# Patient Record
Sex: Male | Born: 1965
Health system: Southern US, Community
[De-identification: ages and names within clinical notes are randomized; demographics above are authoritative.]

## PROBLEM LIST (undated history)

## (undated) DIAGNOSIS — E785 Hyperlipidemia, unspecified: Secondary | ICD-10-CM

## (undated) DIAGNOSIS — I639 Cerebral infarction, unspecified: Secondary | ICD-10-CM

## (undated) DIAGNOSIS — I10 Essential (primary) hypertension: Secondary | ICD-10-CM

## (undated) DIAGNOSIS — R4701 Aphasia: Secondary | ICD-10-CM

## (undated) HISTORY — DX: Aphasia: R47.01

## (undated) HISTORY — DX: Hyperlipidemia, unspecified: E78.5

## (undated) HISTORY — PX: NO PAST SURGERIES: SHX2092

---

## 2015-07-27 DIAGNOSIS — I639 Cerebral infarction, unspecified: Secondary | ICD-10-CM

## 2015-07-27 HISTORY — DX: Cerebral infarction, unspecified: I63.9

## 2015-11-10 DIAGNOSIS — I610 Nontraumatic intracerebral hemorrhage in hemisphere, subcortical: Secondary | ICD-10-CM | POA: Insufficient documentation

## 2015-11-10 DIAGNOSIS — Z8679 Personal history of other diseases of the circulatory system: Secondary | ICD-10-CM | POA: Insufficient documentation

## 2015-11-10 DIAGNOSIS — I619 Nontraumatic intracerebral hemorrhage, unspecified: Secondary | ICD-10-CM | POA: Insufficient documentation

## 2015-11-10 DIAGNOSIS — Z720 Tobacco use: Secondary | ICD-10-CM | POA: Insufficient documentation

## 2015-11-10 DIAGNOSIS — I10 Essential (primary) hypertension: Secondary | ICD-10-CM | POA: Insufficient documentation

## 2015-11-10 HISTORY — DX: Nontraumatic intracerebral hemorrhage in hemisphere, subcortical: I61.0

## 2016-04-28 DIAGNOSIS — I10 Essential (primary) hypertension: Secondary | ICD-10-CM | POA: Diagnosis not present

## 2016-04-28 DIAGNOSIS — E785 Hyperlipidemia, unspecified: Secondary | ICD-10-CM | POA: Diagnosis not present

## 2016-04-28 DIAGNOSIS — Z720 Tobacco use: Secondary | ICD-10-CM | POA: Diagnosis not present

## 2016-04-28 DIAGNOSIS — Z125 Encounter for screening for malignant neoplasm of prostate: Secondary | ICD-10-CM | POA: Diagnosis not present

## 2016-04-28 DIAGNOSIS — Z1159 Encounter for screening for other viral diseases: Secondary | ICD-10-CM | POA: Diagnosis not present

## 2016-04-28 DIAGNOSIS — J441 Chronic obstructive pulmonary disease with (acute) exacerbation: Secondary | ICD-10-CM | POA: Diagnosis not present

## 2016-10-27 DIAGNOSIS — Z1211 Encounter for screening for malignant neoplasm of colon: Secondary | ICD-10-CM | POA: Diagnosis not present

## 2016-10-27 DIAGNOSIS — E785 Hyperlipidemia, unspecified: Secondary | ICD-10-CM | POA: Diagnosis not present

## 2016-10-27 DIAGNOSIS — I10 Essential (primary) hypertension: Secondary | ICD-10-CM | POA: Diagnosis not present

## 2017-01-24 DIAGNOSIS — J0110 Acute frontal sinusitis, unspecified: Secondary | ICD-10-CM | POA: Diagnosis not present

## 2017-01-24 DIAGNOSIS — I638 Other cerebral infarction: Secondary | ICD-10-CM | POA: Diagnosis not present

## 2017-01-24 DIAGNOSIS — I10 Essential (primary) hypertension: Secondary | ICD-10-CM | POA: Diagnosis not present

## 2017-02-22 DIAGNOSIS — J208 Acute bronchitis due to other specified organisms: Secondary | ICD-10-CM | POA: Diagnosis not present

## 2017-02-22 DIAGNOSIS — Z720 Tobacco use: Secondary | ICD-10-CM | POA: Diagnosis not present

## 2017-02-22 DIAGNOSIS — R05 Cough: Secondary | ICD-10-CM | POA: Diagnosis not present

## 2017-03-27 ENCOUNTER — Emergency Department
Admission: EM | Admit: 2017-03-27 | Discharge: 2017-03-27 | Disposition: A | Discharge status: Home / Self Care | Payer: BC Managed Care – PPO | Attending: Emergency Medicine | Admitting: Emergency Medicine

## 2017-03-27 ENCOUNTER — Emergency Department: Payer: BC Managed Care – PPO

## 2017-03-27 ENCOUNTER — Encounter: Payer: Self-pay | Admitting: Emergency Medicine

## 2017-03-27 DIAGNOSIS — Z79899 Other long term (current) drug therapy: Secondary | ICD-10-CM | POA: Insufficient documentation

## 2017-03-27 DIAGNOSIS — Z8673 Personal history of transient ischemic attack (TIA), and cerebral infarction without residual deficits: Secondary | ICD-10-CM | POA: Insufficient documentation

## 2017-03-27 DIAGNOSIS — K047 Periapical abscess without sinus: Secondary | ICD-10-CM | POA: Insufficient documentation

## 2017-03-27 DIAGNOSIS — F17200 Nicotine dependence, unspecified, uncomplicated: Secondary | ICD-10-CM | POA: Diagnosis not present

## 2017-03-27 DIAGNOSIS — I10 Essential (primary) hypertension: Secondary | ICD-10-CM | POA: Diagnosis not present

## 2017-03-27 DIAGNOSIS — R200 Anesthesia of skin: Secondary | ICD-10-CM | POA: Diagnosis not present

## 2017-03-27 HISTORY — DX: Essential (primary) hypertension: I10

## 2017-03-27 HISTORY — DX: Cerebral infarction, unspecified: I63.9

## 2017-03-27 LAB — DIFFERENTIAL
Basophils Absolute: 0.1 K/uL (ref 0–0.1)
Basophils Relative: 1 %
Eosinophils Absolute: 0.3 K/uL (ref 0–0.7)
Eosinophils Relative: 3 %
Lymphocytes Relative: 22 %
Lymphs Abs: 2.5 K/uL (ref 1.0–3.6)
Monocytes Absolute: 1 K/uL (ref 0.2–1.0)
Monocytes Relative: 9 %
Neutro Abs: 7.4 K/uL — ABNORMAL HIGH (ref 1.4–6.5)
Neutrophils Relative %: 65 %

## 2017-03-27 LAB — COMPREHENSIVE METABOLIC PANEL WITH GFR
ALT: 23 U/L (ref 17–63)
AST: 27 U/L (ref 15–41)
Albumin: 3.7 g/dL (ref 3.5–5.0)
Alkaline Phosphatase: 80 U/L (ref 38–126)
Anion gap: 6 (ref 5–15)
BUN: 15 mg/dL (ref 6–20)
CO2: 24 mmol/L (ref 22–32)
Calcium: 8.8 mg/dL — ABNORMAL LOW (ref 8.9–10.3)
Chloride: 108 mmol/L (ref 101–111)
Creatinine, Ser: 1.03 mg/dL (ref 0.61–1.24)
GFR calc Af Amer: >60 mL/min (ref >60)
GFR calc non Af Amer: >60 mL/min (ref >60)
Glucose, Bld: 129 mg/dL — ABNORMAL HIGH (ref 65–99)
Potassium: 3.8 mmol/L (ref 3.5–5.1)
Sodium: 138 mmol/L (ref 135–145)
Total Bilirubin: 1.5 mg/dL — ABNORMAL HIGH (ref 0.3–1.2)
Total Protein: 7.2 g/dL (ref 6.5–8.1)

## 2017-03-27 LAB — CBC
HCT: 46.8 % (ref 40.0–52.0)
Hemoglobin: 16.7 g/dL (ref 13.0–18.0)
MCH: 31.5 pg (ref 26.0–34.0)
MCHC: 35.8 g/dL (ref 32.0–36.0)
MCV: 88.2 fL (ref 80.0–100.0)
Platelets: 253 K/uL (ref 150–440)
RBC: 5.31 MIL/uL (ref 4.40–5.90)
RDW: 13.2 % (ref 11.5–14.5)
WBC: 11.3 K/uL — ABNORMAL HIGH (ref 3.8–10.6)

## 2017-03-27 LAB — APTT: aPTT: 29 s (ref 24–36)

## 2017-03-27 LAB — TROPONIN I: Troponin I: <0.03 ng/mL (ref <0.03)

## 2017-03-27 LAB — PROTIME-INR
INR: 0.99
PROTHROMBIN TIME: 13 s (ref 11.4–15.2)

## 2017-03-27 MED ORDER — CLINDAMYCIN HCL 300 MG PO CAPS: 300.0000 mg | ORAL_CAPSULE | Freq: Three times a day (TID) | ORAL | 0 refills | Status: AC

## 2017-03-27 MED ORDER — HYDROMORPHONE HCL 1 MG/ML IJ SOLN
0.5000 mg | Freq: Once | INTRAMUSCULAR | Status: AC
  Administered 2017-03-27: 0.5 mg via INTRAVENOUS
  Filled 2017-03-27: qty 1

## 2017-03-27 MED ORDER — CLINDAMYCIN PHOSPHATE 600 MG/50ML IV SOLN
600.0000 mg | Freq: Once | INTRAVENOUS | Status: AC
  Administered 2017-03-27: 600 mg via INTRAVENOUS
  Filled 2017-03-27: qty 50

## 2017-03-27 MED ORDER — OXYCODONE-ACETAMINOPHEN 7.5-325 MG PO TABS: 1.0000 | ORAL_TABLET | ORAL | 0 refills | Status: DC | PRN

## 2017-03-27 MED ORDER — OXYCODONE-ACETAMINOPHEN 5-325 MG PO TABS
1.0000 | ORAL_TABLET | Freq: Once | ORAL | Status: AC
  Administered 2017-03-27: 1 via ORAL
  Filled 2017-03-27: qty 1

## 2017-03-27 NOTE — ED Notes (Signed)
FIRST NURSE NOTE:  Right facial numbness since Friday, tingling down backside since last night.  Pt also states he had a toothache.

## 2017-03-27 NOTE — ED Provider Notes (Signed)
[facility] Emergency Department Provider Note  ____________________________________________   First MD Initiated Contact with Patient 03/27/17 1315     (approximate)  I have reviewed the triage vital signs and the nursing notes.   HISTORY  Chief Complaint No chief complaint on file.  Chief complaint is numbness of the face  HPI Jon Yang is a 51 y.o. male with a past history of a stroke who  Numbness on the right side of the jaw He's had a toothache for about a month and numbness for the last couple days. It is spreading it was just in one spot and now spreading to the point of the chin. He is feeling his heart beat in the area of the toothache   Past Medical History:  Diagnosis Date  . Hypertension   . Stroke [facility])     There are no active problems to display for this patient.   History reviewed. No pertinent surgical history.  Prior to Admission medications   Medication Sig Start Date End Date Taking? Authorizing Provider  amLODipine (NORVASC) 5 MG tablet Take 5 mg by mouth daily. 02/22/17  Yes [provider]  cholecalciferol (VITAMIN D) 1000 units tablet Take 5,000 Units by mouth daily.   Yes [provider]  fluticasone (FLONASE) 50 MCG/ACT nasal spray Place 1 spray into both nostrils 2 (two) times daily. 01/24/17  Yes [provider]  ipratropium-albuterol (DUONEB) 0.5-2.5 (3) MG/3ML SOLN Inhale 3 mLs into the lungs 4 (four) times daily as needed for shortness of breath or wheezing. 02/22/17  Yes [provider]  lisinopril (PRINIVIL,ZESTRIL) 30 MG tablet Take 30 mg by mouth daily. 03/16/17  Yes [provider]  PROAIR HFA 108 (90 Base) MCG/ACT inhaler Inhale 2 puffs into the lungs 4 (four) times daily as needed for wheezing or shortness of breath. 02/22/17  Yes [provider]  SUPER B COMPLEX/C PO Take 1 tablet by mouth 2 (two) times daily.   Yes [provider]  clindamycin  (CLEOCIN) 300 MG capsule Take 1 capsule (300 mg total) by mouth 3 (three) times daily. 03/27/17 04/06/17  Arnaldo Natal, MD  oxyCODONE-acetaminophen (PERCOCET) 7.5-325 MG tablet Take 1 tablet by mouth every 4 (four) hours as needed for severe pain. 03/27/17 03/27/18  Arnaldo Natal, MD    Allergies Patient has no known allergies.  History reviewed. No pertinent family history.  Social History Social History  Substance Use Topics  . Smoking status: Current Every Day Smoker  . Smokeless tobacco: Never Used  . Alcohol use No    Review of Systems  Constitutional: No fever/chills Eyes: No visual changes. ENT: No sore throat. Cardiovascular: Denies chest pain. Respiratory: Denies shortness of breath. Gastrointestinal: No abdominal pain.  No nausea, no vomiting.  No diarrhea.  No constipation. Genitourinary: Negative for dysuria. Musculoskeletal: Negative for back pain. Skin: Negative for rash. Neurological: Negative for headaches, focal weakness or numbnessexcept as noted in history of present illness.   ____________________________________________   PHYSICAL EXAM:  VITAL SIGNS: ED Triage Vitals [03/27/17 1137]  Enc Vitals Group     BP (!) 150/93     Pulse Rate 78     Resp 18     Temp 98.2 F (36.8 C)     Temp Source Oral     SpO2 97 %     Weight 213 lb (96.6 kg)     Height 5\' 10"  (1.778 m)     Head Circumference  Peak Flow      Pain Score 4     Pain Loc      Pain Edu?      Excl. in GC?     Constitutional: Alert and oriented. Well appearing and in no acute distress. Eyes: Conjunctivae are normal. PERRL. EOMI. Head: Atraumatic. Nose: No congestion/rhinnorhea. Mouth/Throat: Mucous membranes are moist.  Oropharynx non-erythematous poor dentition multiple cavities 2 most posterior teeth on right side of jaw are tender to percussion.This is the area of the numbness. Neck: No stridor. Cardiovascular: Normal rate, regular rhythm. Grossly normal heart sounds.  Good  peripheral circulation. Respiratory: Normal respiratory effort.  No retractions. Lungs CTAB. Gastrointestinal: Soft and nontender. No distention. No abdominal bruits. No CVA tenderness. }Musculoskeletal: No lower extremity tenderness nor edema.  No joint effusions. Neurologic:  Normal speech and language. No gross focal neurologic deficits are appreciated cranial nerves II through XII are intact except for numbness in the third division of 7 cerebellar finger to nose is norma motor is normal sensation is otherwise normal. . Skin:  Skin is warm, dry and intact. No rash noted. Psychiatric: Mood and affect are normal. Speech and behavior are normal.  ____________________________________________   LABS (all labs ordered are listed, but only abnormal results are displayed)  Labs Reviewed  CBC - Abnormal; Notable for the following:       Result Value   WBC 11.3 (*)    All other components within normal limits  DIFFERENTIAL - Abnormal; Notable for the following:    Neutro Abs 7.4 (*)    All other components within normal limits  COMPREHENSIVE METABOLIC PANEL - Abnormal; Notable for the following:    Glucose, Bld 129 (*)    Calcium 8.8 (*)    Total Bilirubin 1.5 (*)    All other components within normal limits  PROTIME-INR  APTT  TROPONIN I  CBG MONITORING, ED   ____________________________________________  EKG  EKG read and interpreted by [facility] sinus rhythm at 60 normal axis nonspecific ST-T changes ____________________________________________  RADIOLOGY  Ct Head Wo Contrast  Result Date: 03/27/2017 CLINICAL DATA:  Subacute neurologic deficit. Facial numbness for several days. History of CVA. No reported injury. EXAM: CT HEAD WITHOUT CONTRAST TECHNIQUE: Contiguous axial images were obtained from the base of the skull through the vertex without intravenous contrast. COMPARISON:  None. FINDINGS: Brain: No evidence of parenchymal hemorrhage or extra-axial fluid collection. No mass  lesion, mass effect, or midline shift. No CT evidence of acute infarction. Cerebral volume is age appropriate. No ventriculomegaly. Vascular: No acute abnormality. Skull: No evidence of calvarial fracture. Sinuses/Orbits: The visualized paranasal sinuses are essentially clear. Other:  The mastoid air cells are unopacified. IMPRESSION: Negative head CT.  No evidence of acute intracranial abnormality. Electronically Signed   By: Delbert PhenixJason A Poff M.D.   On: 03/27/2017 12:34   Mr Brain Wo Contrast  Result Date: 03/27/2017 CLINICAL DATA:  51 year old male with numbness along the right lower face. Burning sensation in the area. Intermittent toothache for 1 month. Bilateral lower extremity numbness last night. EXAM: MRI HEAD WITHOUT CONTRAST TECHNIQUE: Multiplanar, multiecho pulse sequences of the brain and surrounding structures were obtained without intravenous contrast. COMPARISON:  Head CT without contrast 1158 hours today. FINDINGS: Brain: No restricted diffusion to suggest acute infarction. No midline shift, mass effect, evidence of mass lesion, ventriculomegaly, extra-axial collection or acute intracranial hemorrhage. Cervicomedullary junction and pituitary are within normal limits. Chronic hemorrhagic lacunar infarct of the right lentiform nuclei with hemosiderin. Elsewhere normal for  age Wallace Cullens and white matter signal is within normal limits throughout the brain. No cortical encephalomalacia or other chronic cerebral blood products identified. Brainstem and cerebellum appear normal. Vascular: Major intracranial vascular flow voids are preserved. Skull and upper cervical spine: Negative. Sinuses/Orbits: Normal orbits soft tissues. Visualized paranasal sinuses and mastoids are stable and well pneumatized. Other: Visible internal auditory structures appear normal. Visible scalp and face soft tissues appear negative. IMPRESSION: 1.  No acute intracranial abnormality. 2. Chronic hemorrhagic lacunar infarct of the right  basal ganglia. Otherwise normal for age noncontrast MRI appearance of the brain. Electronically Signed   By: Odessa Fleming M.D.   On: 03/27/2017 15:37   IMPRESSION: 1.  No acute intracranial abnormality. 2. Chronic hemorrhagic lacunar infarct of the right basal ganglia. Otherwise normal for age noncontrast MRI appearance of the brain.   Electronically Signed   By: Odessa Fleming M.D.   On: 03/27/2017 15:37 ____________________________________________   PROCEDURES  Procedure(s) performed:   Procedures  Critical Care performed:  ____________________________________________   INITIAL IMPRESSION / ASSESSMENT AND PLAN / ED COURSE  Pertinent labs & imaging results that were available during my care of the patient were reviewed by me and considered in my medical decision making (see chart for details). discussed with Dr. Chaney Malling DENTIST AT [facility]. She suggested IV Clinda by mouth and since he does not have a dentist [facility]ed [facility] patient he wants to try the antibiotics by mouth first.  This was okay with the dentist. ____________________________________________   FINAL CLINICAL IMPRESSION(S) / ED DIAGNOSES  Final diagnoses:  Dental infection      NEW MEDICATIONS STARTED DURING THIS VISIT:  New Prescriptions   CLINDAMYCIN (CLEOCIN) 300 MG CAPSULE    Take 1 capsule (300 mg total) by mouth 3 (three) times daily.   OXYCODONE-ACETAMINOPHEN (PERCOCET) 7.5-325 MG TABLET    Take 1 tablet by mouth every 4 (four) hours as needed for severe pain.     Note:  This document was prepared using Dragon voice recognition software and may include unintentional dictation errors.    Arnaldo Natal, MD 03/27/17 [phone]

## 2017-03-27 NOTE — Discharge Instructions (Signed)
Return for increased swelling, pain, fever or any trouble swallowing or breathing. Call 911 for the last 2. Take the clindamycin 3 times a day and Percocet 4 times a day as needed  for pain. Follow up with one of the dental clinics on the list I gave you.

## 2017-03-27 NOTE — ED Triage Notes (Signed)
Pt states he has numbness on the right side below lower lip and numbness to chin. Pt states burning sensation to area. Pt reports he has had a toothache x 1 month on and off. Pt c/o numbness in bilateral legs last night. Pt smile is equal but seems to be speaking more out of the left side of his mouth. Hx of stroke in the past. VSS.

## 2017-06-01 DIAGNOSIS — E785 Hyperlipidemia, unspecified: Secondary | ICD-10-CM | POA: Diagnosis not present

## 2017-06-01 DIAGNOSIS — I10 Essential (primary) hypertension: Secondary | ICD-10-CM | POA: Diagnosis not present

## 2017-06-01 DIAGNOSIS — J302 Other seasonal allergic rhinitis: Secondary | ICD-10-CM | POA: Diagnosis not present

## 2017-06-01 DIAGNOSIS — F17200 Nicotine dependence, unspecified, uncomplicated: Secondary | ICD-10-CM | POA: Diagnosis not present

## 2017-08-04 ENCOUNTER — Other Ambulatory Visit: Payer: Self-pay

## 2017-08-04 ENCOUNTER — Emergency Department
Admission: EM | Admit: 2017-08-04 | Discharge: 2017-08-04 | Disposition: A | Discharge status: Home / Self Care | Payer: Self-pay | Attending: Student in an Organized Health Care Education/Training Program | Admitting: Student in an Organized Health Care Education/Training Program

## 2017-08-04 ENCOUNTER — Emergency Department: Payer: Self-pay

## 2017-08-04 DIAGNOSIS — Z79899 Other long term (current) drug therapy: Secondary | ICD-10-CM | POA: Insufficient documentation

## 2017-08-04 DIAGNOSIS — J441 Chronic obstructive pulmonary disease with (acute) exacerbation: Secondary | ICD-10-CM | POA: Insufficient documentation

## 2017-08-04 DIAGNOSIS — J069 Acute upper respiratory infection, unspecified: Secondary | ICD-10-CM | POA: Insufficient documentation

## 2017-08-04 DIAGNOSIS — I10 Essential (primary) hypertension: Secondary | ICD-10-CM | POA: Insufficient documentation

## 2017-08-04 DIAGNOSIS — F17200 Nicotine dependence, unspecified, uncomplicated: Secondary | ICD-10-CM | POA: Insufficient documentation

## 2017-08-04 DIAGNOSIS — B9789 Other viral agents as the cause of diseases classified elsewhere: Secondary | ICD-10-CM | POA: Insufficient documentation

## 2017-08-04 MED ORDER — PREDNISONE 10 MG PO TABS: ORAL_TABLET | ORAL | 0 refills | Status: DC

## 2017-08-04 MED ORDER — IPRATROPIUM-ALBUTEROL 0.5-2.5 (3) MG/3ML IN SOLN
3.0000 mL | Freq: Once | RESPIRATORY_TRACT | Status: AC
  Administered 2017-08-04: 3 mL via RESPIRATORY_TRACT
  Filled 2017-08-04: qty 3

## 2017-08-04 NOTE — ED Notes (Signed)
Pt stated started getting sick on Sunday, worse on Monday, fever started yesterday but did not take temp to find out. bodyaches and chills is why he believes he had a fever. No flu shot

## 2017-08-04 NOTE — ED Triage Notes (Signed)
Pt c/o cough with congestion for the past 2 days. Pt is in NAD on arrival.

## 2017-08-04 NOTE — ED Provider Notes (Signed)
[facility] Department Provider Note  ____________________________________________   First MD Initiated Contact with Patient 08/04/17 1326     (approximate)  I have reviewed the triage vital signs and the nursing notes.   HISTORY  Chief Complaint URI  HPI Jon Yang is a 52 y.o. male is here with complaint of muscle aches and coughing for the last 4-5 days.  He is also had complaints of fever with increased cough and congestion for last 2 days.  Patient has a history of bronchitis and pneumonia.  Patient does smoke every day.  He states that he currently uses DuoNeb treatments at home.  His last one was earlier this morning.  Patient continues to have some squeezing and shortness of breath.  He rates his pain as 3/10.  Past Medical History:  Diagnosis Date  . Hypertension   . Stroke [facility](HCC)     There are no active problems to display for this patient.   History reviewed. No pertinent surgical history.  Prior to Admission medications   Medication Sig Start Date End Date Taking? Authorizing Provider  amLODipine (NORVASC) 5 MG tablet Take 5 mg by mouth daily. 02/22/17   [provider]  cholecalciferol (VITAMIN D) 1000 units tablet Take 5,000 Units by mouth daily.    [provider]  fluticasone (FLONASE) 50 MCG/ACT nasal spray Place 1 spray into both nostrils 2 (two) times daily. 01/24/17   [provider]  ipratropium-albuterol (DUONEB) 0.5-2.5 (3) MG/3ML SOLN Inhale 3 mLs into the lungs 4 (four) times daily as needed for shortness of breath or wheezing. 02/22/17   [provider]  lisinopril (PRINIVIL,ZESTRIL) 30 MG tablet Take 30 mg by mouth daily. 03/16/17   [provider]  predniSONE (DELTASONE) 10 MG tablet Take 3 tablets once a day for 5 days 08/04/17   Tommi RumpsSummers, Rhonda L, PA-C  PROAIR HFA 108 [phone](90 Base) MCG/ACT inhaler Inhale 2 puffs into the lungs 4 (four) times daily as needed for wheezing or  shortness of breath. 02/22/17   [provider]  SUPER B COMPLEX/C PO Take 1 tablet by mouth 2 (two) times daily.    [provider]    Allergies Patient has no known allergies.  No family history on file.  Social History Social History   Tobacco Use  . Smoking status: Current Every Day Smoker  . Smokeless tobacco: Never Used  Substance Use Topics  . Alcohol use: No  . Drug use: No    Review of Systems Constitutional: No fever/chills Eyes: No visual changes. ENT: No sore throat. Cardiovascular: Denies chest pain. Respiratory: Denies shortness of breath.  Positive cough. Gastrointestinal: No abdominal pain.  No nausea, no vomiting.   Musculoskeletal: Positive for muscle aches. Skin: Negative for rash. Neurological: Positive for headaches, negative for focal weakness or numbness. ___________________________________________   PHYSICAL EXAM:  VITAL SIGNS: ED Triage Vitals  Enc Vitals Group     BP 08/04/17 1302 (!) 163/78     Pulse Rate 08/04/17 1302 86     Resp 08/04/17 1302 18     Temp 08/04/17 1302 98.5 F (36.9 C)     Temp Source 08/04/17 1302 Oral     SpO2 08/04/17 1302 93 %     Weight 08/04/17 1249 200 lb (90.7 kg)     Height 08/04/17 1249 5\' 10"  (1.778 m)     Head Circumference --      Peak Flow --      Pain Score 08/04/17  1249 3     Pain Loc --      Pain Edu? --      Excl. in GC? --    Constitutional: Alert and oriented. Well appearing and in no acute distress. Eyes: Conjunctivae are normal.  Head: Atraumatic. Nose: Minimal congestion/rhinnorhea.  TMs are dull bilaterally. Mouth/Throat: Mucous membranes are moist.  Oropharynx non-erythematous.  Posterior drainage present. Neck: No stridor.   Hematological/Lymphatic/Immunilogical: No cervical lymphadenopathy. Cardiovascular: Normal rate, regular rhythm. Grossly normal heart sounds.  Good peripheral circulation. Respiratory: Normal respiratory effort.  No retractions. Lungs minimal  wheeze is heard on expiratory.  Patient has very congested cough.  No respiratory difficulty and patient is able to speak in complete sentences. Gastrointestinal: Soft and nontender. No distention.  Musculoskeletal: Moves upper and lower extremities without any difficulty.  Normal gait was noted. Neurologic:  Normal speech and language. No gross focal neurologic deficits are appreciated.  Skin:  Skin is warm, dry and intact. No rash noted. Psychiatric: Mood and affect are normal. Speech and behavior are normal.  ____________________________________________   LABS (all labs ordered are listed, but only abnormal results are displayed)  Labs Reviewed - No data to display  RADIOLOGY  Dg Chest 2 View  Result Date: 08/04/2017 CLINICAL DATA:  Body aches and fever for the past 4 days. Questionable fever. Current smoker. Previous episodes of bronchitis. EXAM: CHEST  2 VIEW COMPARISON:  None in PACs FINDINGS: The lungs are mildly hyperinflated with hemidiaphragm flattening. The interstitial markings are coarse. There is no alveolar infiltrate or pleural effusion. The heart and pulmonary vascularity are normal. The mediastinum is normal in width. The bony thorax exhibits no acute abnormality. IMPRESSION: COPD. Interstitial changes most compatible with the patient's smoking history. No acute pneumonia nor CHF. Electronically Signed   By: David  [place] M.D.   On: 08/04/2017 14:25    ____________________________________________   PROCEDURES  Procedure(s) performed: None  Procedures  Critical Care performed: No  ____________________________________________   INITIAL IMPRESSION / ASSESSMENT AND PLAN / ED COURSE Patient will continue using his nebulizer treatments at home and regular medication.  Patient was given a prescription for prednisone to help both with his cough and breathing.  Patient states that he has not been on steroids for quite some time for his COPD.  Patient was reassured that  there was no pneumonia noted on his chest x-ray.  He will follow-up with his PCP if any continued problems.  ____________________________________________   FINAL CLINICAL IMPRESSION(S) / ED DIAGNOSES  Final diagnoses:  Viral URI with cough  COPD exacerbation [facility])     ED Discharge Orders        Ordered    predniSONE (DELTASONE) 10 MG tablet     08/04/17 1436       Note:  This document was prepared using Dragon voice recognition software and may include unintentional dictation errors.   Tommi Rumps, PA-C 08/04/17 1528    Willy Eddy, MD 08/04/17 [phone]

## 2017-08-04 NOTE — Discharge Instructions (Signed)
Follow-up with your primary care provider if any continued problems.  Begin taking prednisone 3 tablets each day for the next 5 days.  A coupon was given to you for [facility] which was the cheapest in the area today. Continue your regular medications including the DuoNeb treatments that you have been using at home. Increase fluids.  Tylenol if needed for body aches.

## 2017-08-09 DIAGNOSIS — J40 Bronchitis, not specified as acute or chronic: Secondary | ICD-10-CM | POA: Insufficient documentation

## 2017-08-09 DIAGNOSIS — F17210 Nicotine dependence, cigarettes, uncomplicated: Secondary | ICD-10-CM | POA: Diagnosis not present

## 2017-08-09 DIAGNOSIS — Z7951 Long term (current) use of inhaled steroids: Secondary | ICD-10-CM | POA: Diagnosis not present

## 2017-08-09 DIAGNOSIS — J449 Chronic obstructive pulmonary disease, unspecified: Secondary | ICD-10-CM | POA: Insufficient documentation

## 2017-08-09 DIAGNOSIS — R05 Cough: Secondary | ICD-10-CM | POA: Diagnosis not present

## 2017-08-09 DIAGNOSIS — Z720 Tobacco use: Secondary | ICD-10-CM | POA: Diagnosis not present

## 2017-08-09 DIAGNOSIS — R062 Wheezing: Secondary | ICD-10-CM | POA: Diagnosis not present

## 2017-08-09 DIAGNOSIS — J029 Acute pharyngitis, unspecified: Secondary | ICD-10-CM | POA: Diagnosis not present

## 2017-08-09 DIAGNOSIS — Z8673 Personal history of transient ischemic attack (TIA), and cerebral infarction without residual deficits: Secondary | ICD-10-CM | POA: Diagnosis not present

## 2017-08-09 DIAGNOSIS — Z7952 Long term (current) use of systemic steroids: Secondary | ICD-10-CM | POA: Diagnosis not present

## 2017-08-09 DIAGNOSIS — Z79899 Other long term (current) drug therapy: Secondary | ICD-10-CM | POA: Diagnosis not present

## 2017-08-09 DIAGNOSIS — R51 Headache: Secondary | ICD-10-CM | POA: Diagnosis not present

## 2017-08-09 DIAGNOSIS — R0902 Hypoxemia: Secondary | ICD-10-CM | POA: Diagnosis not present

## 2017-08-09 DIAGNOSIS — J180 Bronchopneumonia, unspecified organism: Secondary | ICD-10-CM | POA: Diagnosis not present

## 2017-08-09 DIAGNOSIS — I10 Essential (primary) hypertension: Secondary | ICD-10-CM | POA: Diagnosis not present

## 2017-08-09 DIAGNOSIS — J441 Chronic obstructive pulmonary disease with (acute) exacerbation: Secondary | ICD-10-CM | POA: Insufficient documentation

## 2017-08-09 DIAGNOSIS — J9801 Acute bronchospasm: Secondary | ICD-10-CM | POA: Diagnosis not present

## 2017-08-09 DIAGNOSIS — R918 Other nonspecific abnormal finding of lung field: Secondary | ICD-10-CM | POA: Diagnosis not present

## 2017-08-10 DIAGNOSIS — Z720 Tobacco use: Secondary | ICD-10-CM | POA: Diagnosis not present

## 2017-08-10 DIAGNOSIS — F17210 Nicotine dependence, cigarettes, uncomplicated: Secondary | ICD-10-CM | POA: Diagnosis not present

## 2017-08-10 DIAGNOSIS — R05 Cough: Secondary | ICD-10-CM | POA: Diagnosis not present

## 2017-08-10 DIAGNOSIS — Z6832 Body mass index (BMI) 32.0-32.9, adult: Secondary | ICD-10-CM | POA: Diagnosis not present

## 2017-08-10 DIAGNOSIS — R918 Other nonspecific abnormal finding of lung field: Secondary | ICD-10-CM | POA: Diagnosis not present

## 2017-08-10 DIAGNOSIS — J441 Chronic obstructive pulmonary disease with (acute) exacerbation: Secondary | ICD-10-CM | POA: Diagnosis not present

## 2017-08-10 DIAGNOSIS — J40 Bronchitis, not specified as acute or chronic: Secondary | ICD-10-CM | POA: Diagnosis not present

## 2017-08-10 DIAGNOSIS — I10 Essential (primary) hypertension: Secondary | ICD-10-CM | POA: Diagnosis not present

## 2017-08-11 DIAGNOSIS — I10 Essential (primary) hypertension: Secondary | ICD-10-CM | POA: Diagnosis not present

## 2017-08-11 DIAGNOSIS — Z720 Tobacco use: Secondary | ICD-10-CM | POA: Diagnosis not present

## 2017-08-11 DIAGNOSIS — J441 Chronic obstructive pulmonary disease with (acute) exacerbation: Secondary | ICD-10-CM | POA: Diagnosis not present

## 2017-08-15 ENCOUNTER — Ambulatory Visit: Payer: BLUE CROSS/BLUE SHIELD | Admitting: Physician Assistant

## 2017-08-15 ENCOUNTER — Encounter: Payer: Self-pay | Admitting: Physician Assistant

## 2017-08-15 VITALS — BP 134/94 | HR 68 | Temp 97.9°F | Resp 16 | Ht 70.5 in | Wt 220.0 lb

## 2017-08-15 DIAGNOSIS — I69359 Hemiplegia and hemiparesis following cerebral infarction affecting unspecified side: Secondary | ICD-10-CM

## 2017-08-15 DIAGNOSIS — I10 Essential (primary) hypertension: Secondary | ICD-10-CM | POA: Diagnosis not present

## 2017-08-15 DIAGNOSIS — Z720 Tobacco use: Secondary | ICD-10-CM

## 2017-08-15 DIAGNOSIS — E785 Hyperlipidemia, unspecified: Secondary | ICD-10-CM

## 2017-08-15 DIAGNOSIS — J449 Chronic obstructive pulmonary disease, unspecified: Secondary | ICD-10-CM

## 2017-08-15 DIAGNOSIS — R51 Headache: Secondary | ICD-10-CM

## 2017-08-15 DIAGNOSIS — R519 Headache, unspecified: Secondary | ICD-10-CM

## 2017-08-15 MED ORDER — IPRATROPIUM BROMIDE HFA 17 MCG/ACT IN AERS: 2.0000 | INHALATION_SPRAY | Freq: Three times a day (TID) | RESPIRATORY_TRACT | 12 refills | Status: DC

## 2017-08-15 NOTE — Patient Instructions (Signed)

## 2017-08-15 NOTE — Progress Notes (Signed)
Patient: Jon Yang Male    DOB: Mar 09, 1966   52 y.o.   MRN: [id number] Visit Date: 08/15/2017  Today's Provider: Trey SailorsAdriana M Pollak, PA-C   Chief Complaint  Patient presents with  . Establish Care  . Hypertension  . COPD   Subjective:    Jon Yang is a 52 y/o male presenting today to establish care. He was [facility] [place], but he has recently moved to [place], [place]. He is currently working in [employer], previously worked for the [employer].  He presents here today with his fiance, Jon Yang. They will be married in April. He has a 52 year old daughter form a previous relationship with no health issues.   He is currently smoking 1 cigarette per day, however, this has only been happening the past two weeks. He has been smoking more or less 3 packs per day x 30 years. He was [facility] for what was presumed to be a COPD exacerbation. He was seen by pulmonologist while in the ER. He was discharged with multiple inhalers including Advair, Atrovent, and albuterol. However, he says he only has the Advair and the albuterol, and he had not had the atrovent filled at the pharmacy. He has had to use his albuterol inhaler once. He is finishing up a course of prednisone from this most recent visit. He is following up with [facility]ology in march.   He has a history of HTN. He is currently on 30 mg Lisinopril and 10 mg Norvasc, which was recently increased. He has been out of one blood pressure medication today, which is why he says his blood pressure is elevated. He takes these without issue.   He had a hemorrhagic stroke in 10/2015. He was [facility] at a [facility] after sudden onset left sided facial and limb weakness. He had uncontrolled HTN at that time. Ct Brain reveals "acute small hemorrhage in the right lentiform nucleus" according to Care Everywhere. He reports he [facility], cannot remember who. Reports  he was released. He reports some residual left leg weakness. He says sometimes he has a headache above his right eye, especially when he is angry.   He reports having a tetanus shot last year. He reports he has never had a penumonia shot.  Hypertension  This is a chronic problem. The problem is unchanged. Pertinent negatives include no chest pain, headaches, malaise/fatigue, PND or sweats. There are no associated agents to hypertension. There are no compliance problems.   COPD  Pertinent negatives include no appetite change, chest pain, dyspnea on exertion, ear congestion, ear pain, fever, headaches, heartburn, malaise/fatigue, myalgias, nasal congestion, orthopnea, PND, postnasal drip, rhinorrhea, sneezing, sore throat, sweats, trouble swallowing or weight loss. His past medical history is significant for COPD.       No Known Allergies   Current Outpatient Medications:  .  ADVAIR DISKUS 500-50 MCG/DOSE AEPB, , Disp: , Rfl: 2 .  amLODipine (NORVASC) 10 MG tablet, Take 10 mg by mouth daily., Disp: , Rfl:  .  amLODipine (NORVASC) 5 MG tablet, Take 5 mg by mouth daily., Disp: , Rfl: 1 .  ipratropium-albuterol (DUONEB) 0.5-2.5 (3) MG/3ML SOLN, Inhale 3 mLs into the lungs 4 (four) times daily as needed for shortness of breath or wheezing., Disp: , Rfl: 5 .  lisinopril (PRINIVIL,ZESTRIL) 30 MG tablet, Take 30 mg by mouth daily., Disp: , Rfl:  .  nicotine (NICODERM CQ - DOSED IN MG/24 HOURS) 21 mg/24hr patch, Place onto the skin., Disp: , Rfl:  .  predniSONE (DELTASONE) 10 MG tablet, Take 3 tablets once a day for 5 days, Disp: 15 tablet, Rfl: 0 .  Spacer/Aero-Holding Chambers (OPTICHAMBER DIAMOND-SM MASK) MISC, Use with inhalers., Disp: , Rfl:  .  cholecalciferol (VITAMIN D) 1000 units tablet, Take 5,000 Units by mouth daily., Disp: , Rfl:  .  fluticasone (FLONASE) 50 MCG/ACT nasal spray, Place 1 spray into both nostrils 2 (two) times daily., Disp: , Rfl: 0 .  PROAIR HFA 108 (90 Base) MCG/ACT  inhaler, Inhale 2 puffs into the lungs 4 (four) times daily as needed for wheezing or shortness of breath., Disp: , Rfl: 2 .  SUPER B COMPLEX/C PO, Take 1 tablet by mouth 2 (two) times daily., Disp: , Rfl:   Review of Systems  Constitutional: Negative.  Negative for appetite change, fever, malaise/fatigue and weight loss.  HENT: Positive for sinus pressure. Negative for ear pain, postnasal drip, rhinorrhea, sneezing, sore throat and trouble swallowing.   Eyes: Negative.   Respiratory: Negative.   Cardiovascular: Negative.  Negative for chest pain, dyspnea on exertion and PND.  Gastrointestinal: Negative.  Negative for heartburn.  Endocrine: Negative.   Genitourinary: Negative.   Musculoskeletal: Negative.  Negative for myalgias.  Skin: Negative.   Allergic/Immunologic: Negative.   Neurological: Negative.  Negative for headaches.  Hematological: Negative.   Psychiatric/Behavioral: Negative.     Social History   Tobacco Use  . Smoking status: Current Every Day Smoker  . Smokeless tobacco: Never Used  Substance Use Topics  . Alcohol use: No   Objective:   BP (!) 134/94 (BP Location: Right Arm, Patient Position: Sitting, Cuff Size: Normal)   Pulse 68   Temp 97.9 F (36.6 C) (Oral)   Resp 16   Ht 5' 10.5" (1.791 m)   Wt 220 lb (99.8 kg)   BMI 31.12 kg/m  Vitals:   08/15/17 1427  BP: (!) 134/94  Pulse: 68  Resp: 16  Temp: 97.9 F (36.6 C)  TempSrc: Oral  Weight: 220 lb (99.8 kg)  Height: 5' 10.5" (1.791 m)     Physical Exam  Constitutional: He is oriented to person, place, and time. He appears well-developed.  HENT:  Right Ear: External ear normal.  Left Ear: External ear normal.  Mouth/Throat: Oropharynx is clear and moist. No oropharyngeal exudate.  Tms with sclerosis bilaterally. Ear canals with redness, dryness, flaking skin bilaterally.  Cardiovascular: Normal rate and regular rhythm.  Pulmonary/Chest: Effort normal and breath sounds normal.  Neurological:  He is alert and oriented to person, place, and time.  Skin: Skin is warm and dry.  Psychiatric: He has a normal mood and affect. His behavior is normal.        Assessment & Plan:     1. History of hemorrhagic stroke with residual hemiparesis (HCC)  Stroke in 2017, some residual left sided weakness. He has HTN, taking amlodipine and Lisinopril. Previously on Lipitor, will likely need to restart this. He has been to neurologist but cannot remember which provider he saw. Counseled on importance of controlling BP, continuing to stop smoking, controlling lipids. Imagine the risk new hemorrhagic stroke with Plavix, ASA, or other anticoagulation outweighs benefit of preventing ischemic stroke, though I would be happy to refer him to neurology to decide on this. Declines today.  2. Tobacco abuse  1 cigarette per day, using nicotine patch.  3. Hypertension, unspecified type  Slightly  elevated today, has been out of one blood pressure medication.  4. Hyperlipidemia, unspecified hyperlipidemia type  Will likely restart Lipitor.  - Comprehensive Metabolic Panel (CMET) - Lipid Profile  5. Chronic obstructive pulmonary disease, unspecified COPD type [facility])  He is following up with [facility] pul[facility]ology in march. Have sent in this inhaler, as it was recommended to him by pulmonology consult at [facility] and he never received it. We will have him follow up in 3-4 months and administer pneumonia vaccine if pulmonology has not done it.  - ipratropium (ATROVENT HFA) 17 MCG/ACT inhaler; Inhale 2 puffs into the lungs 3 (three) times daily.  Dispense: 1 Inhaler; Refill: 12  6. Nonintractable headache, unspecified chronicity pattern, unspecified headache type  With patient's history of smoking and uncontrolled HTN, do have concerns for aneurysm, though this would likely not cause pain. Offered referral to neurology today, patient will consider, but declines at the present.  Return in about 3 months (around  11/13/2017) for COPD, HTN.  The entirety of the information documented in the History of Present Illness, Review of Systems and Physical Exam were personally obtained by me. Portions of this information were initially documented by Kavin Leech, CMA and reviewed by me for thoroughness and accuracy.   I have spent 45 minutes with this patient, >50% of which was spent on counseling and coordination of care.        Trey Sailors, PA-C  [facility]

## 2017-08-16 ENCOUNTER — Telehealth: Payer: Self-pay

## 2017-08-16 LAB — LIPID PANEL
Chol/HDL Ratio: 3.1 ratio (ref 0.0–5.0)
Cholesterol, Total: 192 mg/dL (ref 100–199)
HDL: 62 mg/dL (ref >39)
LDL Calculated: 112 mg/dL — ABNORMAL HIGH (ref 0–99)
Triglycerides: 90 mg/dL (ref 0–149)
VLDL Cholesterol Cal: 18 mg/dL (ref 5–40)

## 2017-08-16 LAB — COMPREHENSIVE METABOLIC PANEL
ALT: 76 IU/L — ABNORMAL HIGH (ref 0–44)
AST: 25 IU/L (ref 0–40)
Albumin/Globulin Ratio: 1.2 (ref 1.2–2.2)
Albumin: 3.9 g/dL (ref 3.5–5.5)
Alkaline Phosphatase: 87 IU/L (ref 39–117)
BUN/Creatinine Ratio: 21 — ABNORMAL HIGH (ref 9–20)
BUN: 19 mg/dL (ref 6–24)
Bilirubin Total: 0.8 mg/dL (ref 0.0–1.2)
CO2: 24 mmol/L (ref 20–29)
Calcium: 9.4 mg/dL (ref 8.7–10.2)
Chloride: 99 mmol/L (ref 96–106)
Creatinine, Ser: 0.89 mg/dL (ref 0.76–1.27)
GFR calc Af Amer: 114 mL/min/{1.73_m2} (ref >59)
GFR calc non Af Amer: 99 mL/min/{1.73_m2} (ref >59)
Globulin, Total: 3.3 g/dL (ref 1.5–4.5)
Glucose: 88 mg/dL (ref 65–99)
Potassium: 4.4 mmol/L (ref 3.5–5.2)
Sodium: 138 mmol/L (ref 134–144)
Total Protein: 7.2 g/dL (ref 6.0–8.5)

## 2017-08-16 NOTE — Telephone Encounter (Signed)
Pt advised.   Thanks,   -Elyana Grabski  

## 2017-08-16 NOTE — Telephone Encounter (Signed)
-----   Message from Trey SailorsAdriana M Pollak, [place] sent at 08/16/2017  3:15 PM EST ----- Glucose does not show diabetes. Kidney function normal. One elevated liver enzyme, would suggest abstaining from alcohol/Tylenol if he uses these. Can monitor. His cholesterol is actually quite well controlled. He is just borderline on the CVD risk score to start a statin. If we control his BP very well and he does quit smoking completely, I think this will control his risk enough without doing a statin like Lipitor. We can check this lab again in 6 months.

## 2017-08-29 DIAGNOSIS — H6692 Otitis media, unspecified, left ear: Secondary | ICD-10-CM | POA: Diagnosis not present

## 2017-08-29 DIAGNOSIS — J019 Acute sinusitis, unspecified: Secondary | ICD-10-CM | POA: Diagnosis not present

## 2017-09-08 ENCOUNTER — Other Ambulatory Visit: Payer: Self-pay | Admitting: Physician Assistant

## 2017-09-08 NOTE — Telephone Encounter (Signed)
Pt requesting a refill of Amlodipine 10 MG 90 day supply sent to [facility] [address]

## 2017-09-13 MED ORDER — AMLODIPINE BESYLATE 10 MG PO TABS: 10.0000 mg | ORAL_TABLET | Freq: Every day | ORAL | 0 refills | Status: DC

## 2017-10-04 DIAGNOSIS — F17210 Nicotine dependence, cigarettes, uncomplicated: Secondary | ICD-10-CM | POA: Diagnosis not present

## 2017-10-04 DIAGNOSIS — Z720 Tobacco use: Secondary | ICD-10-CM | POA: Diagnosis not present

## 2017-10-04 DIAGNOSIS — I10 Essential (primary) hypertension: Secondary | ICD-10-CM | POA: Diagnosis not present

## 2017-10-04 DIAGNOSIS — Z8673 Personal history of transient ischemic attack (TIA), and cerebral infarction without residual deficits: Secondary | ICD-10-CM | POA: Diagnosis not present

## 2017-10-04 DIAGNOSIS — J449 Chronic obstructive pulmonary disease, unspecified: Secondary | ICD-10-CM | POA: Diagnosis not present

## 2017-10-04 DIAGNOSIS — Z79899 Other long term (current) drug therapy: Secondary | ICD-10-CM | POA: Diagnosis not present

## 2017-10-04 DIAGNOSIS — Z6832 Body mass index (BMI) 32.0-32.9, adult: Secondary | ICD-10-CM | POA: Diagnosis not present

## 2017-10-04 DIAGNOSIS — Z7951 Long term (current) use of inhaled steroids: Secondary | ICD-10-CM | POA: Diagnosis not present

## 2017-10-05 ENCOUNTER — Telehealth: Payer: Self-pay | Admitting: Physician Assistant

## 2017-10-05 DIAGNOSIS — R51 Headache: Principal | ICD-10-CM

## 2017-10-05 DIAGNOSIS — Z8673 Personal history of transient ischemic attack (TIA), and cerebral infarction without residual deficits: Secondary | ICD-10-CM

## 2017-10-05 DIAGNOSIS — R519 Headache, unspecified: Secondary | ICD-10-CM

## 2017-10-05 DIAGNOSIS — G8929 Other chronic pain: Secondary | ICD-10-CM

## 2017-10-05 NOTE — Telephone Encounter (Signed)
Patient was seen by "lung doctor"  Dr. Tresa ResLevarge yesterday. He said that Jon Yang wanted him to see a Neurologist after his visit w/ Dr. Tresa ResLevarge. He is calling to have the neurology appt. Scheduled.   He is having really bad headaches, numbness in left side (had stroke previously) and dizziness off and on.

## 2017-10-05 NOTE — Telephone Encounter (Signed)
Please review for Adriana.  Is it okay to refer?   Thanks,   -Vernona RiegerLaura

## 2017-10-18 DIAGNOSIS — I610 Nontraumatic intracerebral hemorrhage in hemisphere, subcortical: Secondary | ICD-10-CM | POA: Diagnosis not present

## 2017-10-18 DIAGNOSIS — R51 Headache: Secondary | ICD-10-CM | POA: Diagnosis not present

## 2017-10-21 DIAGNOSIS — Z8673 Personal history of transient ischemic attack (TIA), and cerebral infarction without residual deficits: Secondary | ICD-10-CM | POA: Diagnosis not present

## 2017-10-21 DIAGNOSIS — I10 Essential (primary) hypertension: Secondary | ICD-10-CM | POA: Diagnosis not present

## 2017-10-21 DIAGNOSIS — R42 Dizziness and giddiness: Secondary | ICD-10-CM | POA: Diagnosis not present

## 2017-10-21 DIAGNOSIS — Z7951 Long term (current) use of inhaled steroids: Secondary | ICD-10-CM | POA: Diagnosis not present

## 2017-10-21 DIAGNOSIS — R51 Headache: Secondary | ICD-10-CM | POA: Diagnosis not present

## 2017-10-21 DIAGNOSIS — J449 Chronic obstructive pulmonary disease, unspecified: Secondary | ICD-10-CM | POA: Diagnosis not present

## 2017-10-21 DIAGNOSIS — Z79899 Other long term (current) drug therapy: Secondary | ICD-10-CM | POA: Diagnosis not present

## 2017-10-21 DIAGNOSIS — R079 Chest pain, unspecified: Secondary | ICD-10-CM | POA: Insufficient documentation

## 2017-10-21 DIAGNOSIS — F17210 Nicotine dependence, cigarettes, uncomplicated: Secondary | ICD-10-CM | POA: Diagnosis not present

## 2017-10-21 DIAGNOSIS — R110 Nausea: Secondary | ICD-10-CM | POA: Diagnosis not present

## 2017-10-21 DIAGNOSIS — R410 Disorientation, unspecified: Secondary | ICD-10-CM | POA: Diagnosis not present

## 2017-10-21 DIAGNOSIS — R519 Headache, unspecified: Secondary | ICD-10-CM | POA: Insufficient documentation

## 2017-10-21 DIAGNOSIS — R918 Other nonspecific abnormal finding of lung field: Secondary | ICD-10-CM | POA: Diagnosis not present

## 2017-10-21 DIAGNOSIS — R0789 Other chest pain: Secondary | ICD-10-CM | POA: Diagnosis not present

## 2017-11-03 ENCOUNTER — Encounter: Payer: Self-pay | Admitting: Physician Assistant

## 2017-11-03 ENCOUNTER — Ambulatory Visit: Payer: BLUE CROSS/BLUE SHIELD | Admitting: Physician Assistant

## 2017-11-03 VITALS — BP 112/78 | HR 72 | Temp 98.1°F | Resp 16 | Wt 218.0 lb

## 2017-11-03 DIAGNOSIS — R079 Chest pain, unspecified: Secondary | ICD-10-CM | POA: Diagnosis not present

## 2017-11-03 DIAGNOSIS — Z716 Tobacco abuse counseling: Secondary | ICD-10-CM | POA: Diagnosis not present

## 2017-11-03 DIAGNOSIS — G43909 Migraine, unspecified, not intractable, without status migrainosus: Secondary | ICD-10-CM | POA: Insufficient documentation

## 2017-11-03 DIAGNOSIS — G43809 Other migraine, not intractable, without status migrainosus: Secondary | ICD-10-CM

## 2017-11-03 DIAGNOSIS — R109 Unspecified abdominal pain: Secondary | ICD-10-CM | POA: Diagnosis not present

## 2017-11-03 DIAGNOSIS — I10 Essential (primary) hypertension: Secondary | ICD-10-CM

## 2017-11-03 NOTE — Progress Notes (Signed)
Patient: Jon Yang Male    DOB: 11/16/1965   52 y.o.   MRN: [id number] Visit Date: 11/03/2017  Today's Provider: Trey Sailors, PA-C   Chief Complaint  Patient presents with  . Hospitalization Follow-up   Subjective:    HPI   Follow Up ER Visit  Patient is here for ER follow up.  He was recently seen at [facility] for confusion and chest pain on 10/21/2017. Had stress test two years ago which he says was normal. Says he will get chest pain when worked up or when she has migraine. Treatment for this included CTA brain which was normal and referral to neurology. Chest pain workup negative. He has also seen pulmonology and they discontinued Advair and started Trelegy.  He reports this condition is Improved slightly. He reports that he still has some confusion, but it is not as bad as before.   He [facility], been started on magnesium. Getting MRA to r/o aneurysm. Taking aspirin now.   He has begun smoking a pack and a half of cigarettes daily again. Has stopped pravastatin because it made him feel weird.   Also reports sharp pain in his right shoulder blade. Says he has a history of clot in his lungs, seen at [facility]. Says it was excruciating pain in the same area. Denies SOB, Chest pain, leg swelling, active cancer, immobility. Says pain comes and goes. Does not think it's related to eating. Not nauseated. Does still have gallbladder.    No Known Allergies   Current Outpatient Medications:  .  amLODipine (NORVASC) 10 MG tablet, Take 1 tablet (10 mg total) by mouth daily., Disp: 90 tablet, Rfl: 0 .  cholecalciferol (VITAMIN D) 1000 units tablet, Take 5,000 Units by mouth daily., Disp: , Rfl:  .  fluticasone (FLONASE) 50 MCG/ACT nasal spray, Place 1 spray into both nostrils 2 (two) times daily., Disp: , Rfl: 0 .  ipratropium (ATROVENT HFA) 17 MCG/ACT inhaler, Inhale 2 puffs into the lungs 3 (three) times daily., Disp: 1 Inhaler, Rfl: 12 .   ipratropium-albuterol (DUONEB) 0.5-2.5 (3) MG/3ML SOLN, Inhale 3 mLs into the lungs 4 (four) times daily as needed for shortness of breath or wheezing., Disp: , Rfl: 5 .  lisinopril (PRINIVIL,ZESTRIL) 30 MG tablet, Take 30 mg by mouth daily., Disp: , Rfl:  .  nicotine (NICODERM CQ - DOSED IN MG/24 HOURS) 21 mg/24hr patch, Place onto the skin., Disp: , Rfl:  .  PROAIR HFA 108 (90 Base) MCG/ACT inhaler, Inhale 2 puffs into the lungs 4 (four) times daily as needed for wheezing or shortness of breath., Disp: , Rfl: 2 .  Spacer/Aero-Holding Chambers (OPTICHAMBER DIAMOND-SM MASK) MISC, Use with inhalers., Disp: , Rfl:  .  SUPER B COMPLEX/C PO, Take 1 tablet by mouth 2 (two) times daily., Disp: , Rfl:  .  ADVAIR DISKUS 500-50 MCG/DOSE AEPB, , Disp: , Rfl: 2 .  predniSONE (DELTASONE) 10 MG tablet, Take 3 tablets once a day for 5 days (Patient not taking: Reported on 11/03/2017), Disp: 15 tablet, Rfl: 0  Review of Systems  Constitutional: Positive for fatigue.  Respiratory: Negative for shortness of breath.   Cardiovascular: Negative for chest pain and palpitations.  Musculoskeletal: Negative.   Neurological: Negative for dizziness and headaches.  Psychiatric/Behavioral: Negative.     Social History   Tobacco Use  . Smoking status: Current Every Day Smoker  . Smokeless tobacco: Never Used  Substance Use Topics  .  Alcohol use: No   Objective:   BP 112/78 (BP Location: Right Arm, Patient Position: Sitting, Cuff Size: Normal)   Pulse 72   Temp 98.1 F (36.7 C)   Resp 16   Wt 218 lb (98.9 kg)   SpO2 97%   BMI 30.84 kg/m  Vitals:   11/03/17 1511  BP: 112/78  Pulse: 72  Resp: 16  Temp: 98.1 F (36.7 C)  SpO2: 97%  Weight: 218 lb (98.9 kg)     Physical Exam  Constitutional: He is oriented to person, place, and time. He appears well-developed and well-nourished. No distress.  HENT:  Head: Normocephalic and atraumatic.  Eyes: Pupils are equal, round, and reactive to light.  Conjunctivae are normal.  Cardiovascular: Normal rate and regular rhythm.  Pulmonary/Chest: Effort normal and breath sounds normal.  Abdominal: Soft. Bowel sounds are normal. He exhibits no distension.  Neurological: He is alert and oriented to person, place, and time.  Skin: Skin is warm and dry.  Psychiatric: He has a normal mood and affect. His behavior is normal.        Assessment & Plan:     1. Chest pain, unspecified type  Workup normal in ER. May need repeat stress test/cath due to high cardiac risk. Does not want to restart pravastatin, will work on smoking cessation until next visit.   - Ambulatory referral to Cardiology - D-Dimer, Quantitative  2. Hypertension, unspecified type  Controlled today.  3. Other migraine without status migrainosus, not intractable Started on 500 mg magnesium, MRA pending.  4. Abdominal pain, unspecified abdominal location  Will also assess for gallstones.   - US Abdomen Limited RUQ; Future  5. Tobacco abuse counseling  Have counseled patient for at least 3 minutes on smoking cessation.   - buPROPion (WELLBUTRIN SR) 150 MG 12 hr tablet; Take one tablet daily for three days. On day four, take one tablet twice daily and continue.  Dispense: 90 tablet; Refill: 0  Return in about 1 month (around 12/03/2017) for smoking cessation.  The entirety of the information documented in the History of Present Illness, Review of Systems and Physical Exam were personally obtained by me. Portions of this information were initially documented by Kavin LeechLaura Walsh, CMA and reviewed by me for thoroughness and accuracy.        Trey SailorsAdriana M Janai Brannigan, PA-C  [facility]

## 2017-11-03 NOTE — Patient Instructions (Signed)
Chest Wall Pain °Chest wall pain is pain in or around the bones and muscles of your chest. Sometimes, an injury causes this pain. Sometimes, the cause may not be known. This pain may take several weeks or longer to get better. °Follow these instructions at home: °Pay attention to any changes in your symptoms. Take these actions to help with your pain: °· Rest as told by your doctor. °· Avoid activities that cause pain. Try not to use your chest, belly (abdominal), or side muscles to lift heavy things. °· If directed, apply ice to the painful area: °? Put ice in a plastic bag. °? Place a towel between your skin and the bag. °? Leave the ice on for 20 minutes, 2-3 times per day. °· Take over-the-counter and prescription medicines only as told by your doctor. °· Do not use tobacco products, including cigarettes, chewing tobacco, and e-cigarettes. If you need help quitting, ask your doctor. °· Keep all follow-up visits as told by your doctor. This is important. ° °Contact a doctor if: °· You have a fever. °· Your chest pain gets worse. °· You have new symptoms. °Get help right away if: °· You feel sick to your stomach (nauseous) or you throw up (vomit). °· You feel sweaty or light-headed. °· You have a cough with phlegm (sputum) or you cough up blood. °· You are short of breath. °This information is not intended to replace advice given to you by your health care provider. Make sure you discuss any questions you have with your health care provider. °Document Released: 12/29/2007 Document Revised: 12/18/2015 Document Reviewed: 10/07/2014 °Elsevier Interactive Patient Education © 2018 Elsevier Inc. ° °

## 2017-11-04 ENCOUNTER — Telehealth: Payer: Self-pay

## 2017-11-04 LAB — D-DIMER, QUANTITATIVE: D-DIMER: 0.48 mg/L FEU (ref 0.00–0.49)

## 2017-11-04 MED ORDER — BUPROPION HCL ER (SR) 150 MG PO TB12: ORAL_TABLET | ORAL | 0 refills | Status: DC

## 2017-11-04 NOTE — Telephone Encounter (Signed)
LMTCB 11/04/2017  Thanks,   -Laura  

## 2017-11-04 NOTE — Telephone Encounter (Signed)
-----   Message from Trey SailorsAdriana M Pollak, [place]-C sent at 11/04/2017 11:45 AM EDT ----- D-dimer is normal. This means his pain is very likely not from a clot in his lungs.

## 2017-11-08 NOTE — Telephone Encounter (Signed)
LMTCB 11/08/2017  Thanks,   -Blane Worthington  

## 2017-11-09 ENCOUNTER — Ambulatory Visit: Payer: BLUE CROSS/BLUE SHIELD

## 2017-11-10 ENCOUNTER — Telehealth: Payer: Self-pay | Admitting: Physician Assistant

## 2017-11-10 NOTE — Telephone Encounter (Signed)
FyI--Pt has cancelled abdominal ultrasound twice

## 2017-11-14 ENCOUNTER — Ambulatory Visit: Payer: BLUE CROSS/BLUE SHIELD

## 2017-11-16 NOTE — Telephone Encounter (Signed)
Patient advised.

## 2017-11-21 ENCOUNTER — Other Ambulatory Visit: Payer: Self-pay | Admitting: Physician Assistant

## 2017-11-21 DIAGNOSIS — I2699 Other pulmonary embolism without acute cor pulmonale: Secondary | ICD-10-CM

## 2017-11-21 HISTORY — DX: Other pulmonary embolism without acute cor pulmonale: I26.99

## 2017-12-01 ENCOUNTER — Ambulatory Visit: Payer: BLUE CROSS/BLUE SHIELD | Admitting: Physician Assistant

## 2017-12-14 ENCOUNTER — Other Ambulatory Visit: Payer: Self-pay | Admitting: Physician Assistant

## 2018-01-08 ENCOUNTER — Other Ambulatory Visit: Payer: Self-pay | Admitting: Physician Assistant

## 2018-01-08 DIAGNOSIS — Z716 Tobacco abuse counseling: Secondary | ICD-10-CM

## 2018-01-11 NOTE — Telephone Encounter (Signed)
Please schedule follow up before more refills.

## 2018-01-12 NOTE — Telephone Encounter (Signed)
Pt advised.  He states he recently changed jobs and is not sure if his insurance started yet.  He will call back to schedule once he gets his insurance cards in the mail.   Thanks,   -Vernona RiegerLaura

## 2018-02-24 ENCOUNTER — Ambulatory Visit: Payer: BLUE CROSS/BLUE SHIELD | Admitting: Physician Assistant

## 2018-02-24 ENCOUNTER — Encounter: Payer: Self-pay | Admitting: Physician Assistant

## 2018-02-24 VITALS — BP 140/68 | HR 68 | Temp 98.6°F | Wt 230.0 lb

## 2018-02-24 DIAGNOSIS — R52 Pain, unspecified: Secondary | ICD-10-CM

## 2018-02-24 DIAGNOSIS — S91001A Unspecified open wound, right ankle, initial encounter: Secondary | ICD-10-CM

## 2018-02-24 MED ORDER — DOXYCYCLINE HYCLATE 100 MG PO TABS: 100.0000 mg | ORAL_TABLET | Freq: Two times a day (BID) | ORAL | 0 refills | Status: AC

## 2018-02-24 MED ORDER — TRAMADOL HCL 50 MG PO TABS: 50.0000 mg | ORAL_TABLET | Freq: Two times a day (BID) | ORAL | 0 refills | Status: AC

## 2018-02-24 NOTE — Patient Instructions (Signed)

## 2018-02-24 NOTE — Progress Notes (Signed)
Patient: Jon Yang Male    DOB: 1966/02/06   52 y.o.   MRN: [id number] Visit Date: 03/30/2018  Today's Provider: Trey Sailors, PA-C   No chief complaint on file.  Subjective:    HPI  Wound on right ankle, says it has been there for ten years. Weeps sometimes, sometimes gets better. Denies injuries, does not have diabetes. No fevers, chills, nausea, vomiting.      No Known Allergies   Current Outpatient Medications:  .  ADVAIR DISKUS 500-50 MCG/DOSE AEPB, , Disp: , Rfl: 2 .  amLODipine (NORVASC) 10 MG tablet, TAKE 1 TABLET BY MOUTH EVERY DAY, Disp: 30 tablet, Rfl: 0 .  cholecalciferol (VITAMIN D) 1000 units tablet, Take 5,000 Units by mouth daily., Disp: , Rfl:  .  fluticasone (FLONASE) 50 MCG/ACT nasal spray, Place 1 spray into both nostrils 2 (two) times daily., Disp: , Rfl: 0 .  ipratropium (ATROVENT HFA) 17 MCG/ACT inhaler, Inhale 2 puffs into the lungs 3 (three) times daily., Disp: 1 Inhaler, Rfl: 12 .  ipratropium-albuterol (DUONEB) 0.5-2.5 (3) MG/3ML SOLN, Inhale 3 mLs into the lungs 4 (four) times daily as needed for shortness of breath or wheezing., Disp: , Rfl: 5 .  lisinopril (PRINIVIL,ZESTRIL) 30 MG tablet, Take 30 mg by mouth daily., Disp: , Rfl:  .  nicotine (NICODERM CQ - DOSED IN MG/24 HOURS) 21 mg/24hr patch, Place onto the skin., Disp: , Rfl:  .  PROAIR HFA 108 (90 Base) MCG/ACT inhaler, Inhale 2 puffs into the lungs 4 (four) times daily as needed for wheezing or shortness of breath., Disp: , Rfl: 2 .  Spacer/Aero-Holding Chambers (OPTICHAMBER DIAMOND-SM MASK) MISC, Use with inhalers., Disp: , Rfl:  .  SUPER B COMPLEX/C PO, Take 1 tablet by mouth 2 (two) times daily., Disp: , Rfl:   Review of Systems  Constitutional: Negative.   Skin: Positive for wound. Negative for color change, pallor and rash.    Social History   Tobacco Use  . Smoking status: Current Every Day Smoker  . Smokeless tobacco: Never Used  Substance Use Topics  . Alcohol use:  No   Objective:   BP 140/68 (BP Location: Right Arm, Patient Position: Sitting, Cuff Size: Large)   Pulse 68   Temp 98.6 F (37 C) (Oral)   Wt 230 lb (104.3 kg)   SpO2 97%   BMI 32.54 kg/m  Vitals:   02/24/18 1627  BP: 140/68  Pulse: 68  Temp: 98.6 F (37 C)  TempSrc: Oral  SpO2: 97%  Weight: 230 lb (104.3 kg)     Physical Exam  Constitutional: He is oriented to person, place, and time.  Cardiovascular: Normal rate and regular rhythm.  Pulmonary/Chest: Effort normal and breath sounds normal.  Neurological: He is alert and oriented to person, place, and time.  Skin: Skin is warm and dry.  Erythematous lesion on right ankle.   Psychiatric: He has a normal mood and affect. His behavior is normal.        Assessment & Plan:     1. Pain  Uncertain etiology of wound. Treat and eval as below. Will have him see wound clinic.   - traMADol (ULTRAM) 50 MG tablet; Take 1 tablet (50 mg total) by mouth 2 (two) times daily for 5 days.  Dispense: 10 tablet; Refill: 0  2. Wound of right ankle, initial encounter  - Aerobic culture - doxycycline (VIBRA-TABS) 100 MG tablet; Take 1 tablet (100 mg total) by mouth  2 (two) times daily for 7 days.  Dispense: 14 tablet; Refill: 0 - AMB referral to wound care center - DG Ankle Complete Right; Future  No follow-ups on file.  The entirety of the information documented in the History of Present Illness, Review of Systems and Physical Exam were personally obtained by me. Portions of this information were initially documented by Kavin LeechLaura Nixie Laube, CMA and reviewed by me for thoroughness and accuracy.         Trey SailorsAdriana M Pollak, PA-C  [facility]

## 2018-02-26 LAB — AEROBIC CULTURE

## 2018-02-27 ENCOUNTER — Telehealth: Payer: Self-pay

## 2018-02-27 NOTE — Telephone Encounter (Signed)
Pt advised.   Thanks,   -Laura  

## 2018-02-27 NOTE — Telephone Encounter (Signed)
-----   Message from Trey SailorsAdriana M Pollak, [place]PA-C sent at 02/27/2018  3:42 PM EDT ----- Culture did not grow anything, but would continue to take doxycycline. Please follow up as scheduled with wound clinic.

## 2018-03-17 ENCOUNTER — Ambulatory Visit: Payer: Self-pay | Admitting: Physician Assistant

## 2018-04-07 ENCOUNTER — Ambulatory Visit: Payer: Self-pay | Admitting: Physician Assistant

## 2018-05-10 ENCOUNTER — Ambulatory Visit (INDEPENDENT_AMBULATORY_CARE_PROVIDER_SITE_OTHER): Payer: BLUE CROSS/BLUE SHIELD | Admitting: Physician Assistant

## 2018-05-10 ENCOUNTER — Encounter: Payer: Self-pay | Admitting: Physician Assistant

## 2018-05-10 VITALS — BP 140/98 | HR 81 | Temp 98.8°F | Wt 235.6 lb

## 2018-05-10 DIAGNOSIS — I10 Essential (primary) hypertension: Secondary | ICD-10-CM | POA: Diagnosis not present

## 2018-05-10 DIAGNOSIS — J449 Chronic obstructive pulmonary disease, unspecified: Secondary | ICD-10-CM

## 2018-05-10 DIAGNOSIS — J441 Chronic obstructive pulmonary disease with (acute) exacerbation: Secondary | ICD-10-CM | POA: Diagnosis not present

## 2018-05-10 MED ORDER — DOXYCYCLINE HYCLATE 100 MG PO TABS: 100.0000 mg | ORAL_TABLET | Freq: Two times a day (BID) | ORAL | 0 refills | Status: AC

## 2018-05-10 MED ORDER — PREDNISONE 20 MG PO TABS: 20.0000 mg | ORAL_TABLET | Freq: Every day | ORAL | 0 refills | Status: AC

## 2018-05-10 NOTE — Patient Instructions (Signed)
Chronic Obstructive Pulmonary Disease Exacerbation  Chronic obstructive pulmonary disease (COPD) is a common lung problem. In COPD, the flow of air from the lungs is limited. COPD exacerbations are times that breathing gets worse and you need extra treatment. Without treatment they can be life threatening. If they happen often, your lungs can become more damaged. If your COPD gets worse, your doctor may treat you with:  ? Medicines.  ? Oxygen.  ? Different ways to clear your airway, such as using a mask.    Follow these instructions at home:  ? Do not smoke.  ? Avoid tobacco smoke and other things that bother your lungs.  ? If given, take your antibiotic medicine as told. Finish the medicine even if you start to feel better.  ? Only take medicines as told by your doctor.  ? Drink enough fluids to keep your pee (urine) clear or pale yellow (unless your doctor has told you not to).  ? Use a cool mist machine (vaporizer).  ? If you use oxygen or a machine that turns liquid medicine into a mist (nebulizer), continue to use them as told.  ? Keep up with shots (vaccinations) as told by your doctor.  ? Exercise regularly.  ? Eat healthy foods.  ? Keep all doctor visits as told.  Get help right away if:  ? You are very short of breath and it gets worse.  ? You have trouble talking.  ? You have bad chest pain.  ? You have blood in your spit (sputum).  ? You have a fever.  ? You keep throwing up (vomiting).  ? You feel weak, or you pass out (faint).  ? You feel confused.  ? You keep getting worse.  This information is not intended to replace advice given to you by your health care provider. Make sure you discuss any questions you have with your health care provider.  Document Released: 07/01/2011 Document Revised: 12/18/2015 Document Reviewed: 03/16/2013  Elsevier Interactive Patient Education ? 2017 Elsevier Inc.

## 2018-05-10 NOTE — Progress Notes (Signed)
Patient: Jon Yang Male    DOB: March 25, 1966   52 y.o.   MRN: [id number] Visit Date: 05/11/2018  Today's Provider: Trey Sailors, PA-C   Chief Complaint  Patient presents with  . URI   Subjective:    Upper Respiratory Infection Patient has a history of COPD and current tobacco abuse presents today for upper respiratory infection symptoms that occurred 2 weeks ago. His advair inhaler was switched to Trelegy by [facility]. Patient reports he has not been taking it every day, only when he feels he needs to. Patient states he is having chills, congestion, pressure behind the eyes, watery eyes, headaches and nausea. Patient states he has tried Mucinex to help with symptoms on 05/09/18. He reports SOB and productive cough.   BP Readings from Last 3 Encounters:  05/10/18 (!) 140/98  02/24/18 140/68  11/03/17 112/78   HTN: taking 10 mg amlodipine and 30 mg lisinopril without issue.     No Known Allergies   Current Outpatient Medications:  .  Fluticasone-Umeclidin-Vilant 100-62.5-25 MCG/INH AEPB, Inhale 1 puff into the lungs daily., Disp: , Rfl:  .  amLODipine (NORVASC) 10 MG tablet, TAKE 1 TABLET BY MOUTH EVERY DAY, Disp: 30 tablet, Rfl: 0 .  cholecalciferol (VITAMIN D) 1000 units tablet, Take 5,000 Units by mouth daily., Disp: , Rfl:  .  doxycycline (VIBRA-TABS) 100 MG tablet, Take 1 tablet (100 mg total) by mouth 2 (two) times daily for 7 days., Disp: 14 tablet, Rfl: 0 .  fluticasone (FLONASE) 50 MCG/ACT nasal spray, Place 1 spray into both nostrils 2 (two) times daily., Disp: , Rfl: 0 .  ipratropium (ATROVENT HFA) 17 MCG/ACT inhaler, Inhale 2 puffs into the lungs 3 (three) times daily., Disp: 1 Inhaler, Rfl: 12 .  ipratropium-albuterol (DUONEB) 0.5-2.5 (3) MG/3ML SOLN, Inhale 3 mLs into the lungs 4 (four) times daily as needed for shortness of breath or wheezing., Disp: , Rfl: 5 .  lisinopril (PRINIVIL,ZESTRIL) 30 MG tablet, Take 30 mg by mouth daily., Disp: , Rfl:  .   nicotine (NICODERM CQ - DOSED IN MG/24 HOURS) 21 mg/24hr patch, Place onto the skin., Disp: , Rfl:  .  predniSONE (DELTASONE) 20 MG tablet, Take 1 tablet (20 mg total) by mouth daily with breakfast for 5 days., Disp: 5 tablet, Rfl: 0 .  PROAIR HFA 108 (90 Base) MCG/ACT inhaler, Inhale 2 puffs into the lungs 4 (four) times daily as needed for wheezing or shortness of breath., Disp: , Rfl: 2 .  Spacer/Aero-Holding Chambers (OPTICHAMBER DIAMOND-SM MASK) MISC, Use with inhalers., Disp: , Rfl:  .  SUPER B COMPLEX/C PO, Take 1 tablet by mouth 2 (two) times daily., Disp: , Rfl:   Review of Systems  Constitutional: Positive for appetite change and chills.  HENT: Positive for congestion, sinus pressure, sneezing and sore throat.   Eyes: Positive for discharge.  Respiratory: Positive for cough, shortness of breath and wheezing.   Gastrointestinal: Positive for constipation, diarrhea and nausea.  Allergic/Immunologic: Negative.   Neurological: Positive for headaches.    Social History   Tobacco Use  . Smoking status: Current Every Day Smoker  . Smokeless tobacco: Never Used  Substance Use Topics  . Alcohol use: No   Objective:   BP (!) 140/98 (BP Location: Right Arm, Patient Position: Sitting, Cuff Size: Normal)   Pulse 81   Temp 98.8 F (37.1 C) (Oral)   Wt 235 lb 9.6 oz (106.9 kg)   SpO2 96%   BMI  33.33 kg/m  Vitals:   05/10/18 1057  BP: (!) 140/98  Pulse: 81  Temp: 98.8 F (37.1 C)  TempSrc: Oral  SpO2: 96%  Weight: 235 lb 9.6 oz (106.9 kg)     Physical Exam  Constitutional: He is oriented to person, place, and time. He appears well-developed and well-nourished.  Cardiovascular: Normal rate and regular rhythm.  Pulmonary/Chest: He has wheezes. He has no rales.  Wheezes and rhonchi diffusely throughout lungs.  Neurological: He is alert and oriented to person, place, and time.  Skin: Skin is warm and dry.  Psychiatric: He has a normal mood and affect. His behavior is normal.         Assessment & Plan:     1. COPD exacerbation (HCC)  Have instructed him that trelegy is for daily use. Follow up if worsening.   - doxycycline (VIBRA-TABS) 100 MG tablet; Take 1 tablet (100 mg total) by mouth 2 (two) times daily for 7 days.  Dispense: 14 tablet; Refill: 0 - predniSONE (DELTASONE) 20 MG tablet; Take 1 tablet (20 mg total) by mouth daily with breakfast for 5 days.  Dispense: 5 tablet; Refill: 0  2. HTN  Continue medications. Slightly elevated today but he is ill.  Return if symptoms worsen or fail to improve.  The entirety of the information documented in the History of Present Illness, Review of Systems and Physical Exam were personally obtained by me. Portions of this information were initially documented by Dara Lords, CMA and reviewed by me for thoroughness and accuracy.            Trey Sailors, PA-C  [facility]

## 2018-06-01 ENCOUNTER — Other Ambulatory Visit: Payer: Self-pay | Admitting: Physician Assistant

## 2018-06-01 DIAGNOSIS — I10 Essential (primary) hypertension: Secondary | ICD-10-CM

## 2018-07-31 ENCOUNTER — Telehealth: Payer: Self-pay | Admitting: Physician Assistant

## 2018-07-31 DIAGNOSIS — J449 Chronic obstructive pulmonary disease, unspecified: Secondary | ICD-10-CM

## 2018-07-31 MED ORDER — PROAIR HFA 108 (90 BASE) MCG/ACT IN AERS: 2.0000 | INHALATION_SPRAY | Freq: Four times a day (QID) | RESPIRATORY_TRACT | 2 refills | Status: DC | PRN

## 2018-07-31 NOTE — Telephone Encounter (Signed)
Filled

## 2018-07-31 NOTE — Telephone Encounter (Signed)
Pt needing a refill on his albuterol Sulfate.  Please fill at:  [facility] [address], [place] - [address] [phone] (Phone) [phone] (Fax)   Thanks, [facility]

## 2018-08-10 ENCOUNTER — Other Ambulatory Visit: Payer: Self-pay

## 2018-08-10 MED ORDER — IPRATROPIUM-ALBUTEROL 0.5-2.5 (3) MG/3ML IN SOLN: 3.0000 mL | Freq: Four times a day (QID) | RESPIRATORY_TRACT | 5 refills | Status: DC | PRN

## 2018-08-10 NOTE — Telephone Encounter (Signed)
Patient is asking refill on the following medication ipratropium-albuterol (DUONEB) 0.5-2.5 (3) MG/3ML SOLN   [facility] [address]

## 2018-08-25 ENCOUNTER — Other Ambulatory Visit: Payer: Self-pay | Admitting: Physician Assistant

## 2018-08-25 DIAGNOSIS — I10 Essential (primary) hypertension: Secondary | ICD-10-CM

## 2018-09-12 ENCOUNTER — Ambulatory Visit: Payer: BLUE CROSS/BLUE SHIELD | Admitting: Physician Assistant

## 2018-09-12 ENCOUNTER — Encounter: Payer: Self-pay | Admitting: Physician Assistant

## 2018-09-12 VITALS — BP 170/109 | HR 80 | Temp 98.1°F | Resp 20 | Wt 230.0 lb

## 2018-09-12 DIAGNOSIS — R059 Cough, unspecified: Secondary | ICD-10-CM

## 2018-09-12 DIAGNOSIS — R05 Cough: Secondary | ICD-10-CM | POA: Diagnosis not present

## 2018-09-12 DIAGNOSIS — J441 Chronic obstructive pulmonary disease with (acute) exacerbation: Secondary | ICD-10-CM

## 2018-09-12 MED ORDER — DOXYCYCLINE HYCLATE 100 MG PO TABS: 100.0000 mg | ORAL_TABLET | Freq: Two times a day (BID) | ORAL | 0 refills | Status: AC

## 2018-09-12 MED ORDER — IPRATROPIUM-ALBUTEROL 0.5-2.5 (3) MG/3ML IN SOLN: 3.0000 mL | Freq: Once | RESPIRATORY_TRACT | Status: DC

## 2018-09-12 MED ORDER — PREDNISONE 10 MG (21) PO TBPK: ORAL_TABLET | ORAL | 0 refills | Status: DC

## 2018-09-12 NOTE — Patient Instructions (Addendum)
YOU MAY NOT TAKE DECONGESTANTS. You can take Coricidin for congestion. You can take mucinex and delsym PLAIN. You May not take anything with the letter 'D' at the end: Mucinex-D, Allegra-D. You may not take and COLD/FLU formultation: Tylenol sinus, tylenol cold and flu.     Chronic Obstructive Pulmonary Disease Exacerbation Chronic obstructive pulmonary disease (COPD) is a long-term (chronic) lung problem. In COPD, the flow of air from the lungs is limited. COPD exacerbations are times that breathing gets worse and you need more than your normal treatment. Without treatment, they can be life threatening. If they happen often, your lungs can become more damaged. If your COPD gets worse, your doctor may treat you with:  Medicines.  Oxygen.  Different ways to clear your airway, such as using a mask. Follow these instructions at home: Medicines  Take over-the-counter and prescription medicines only as told by your doctor.  If you take an antibiotic or steroid medicine, do not stop taking the medicine even if you start to feel better.  Keep up with shots (vaccinations) as told by your doctor. Be sure to get a yearly (annual) flu shot. Lifestyle  Do not smoke. If you need help quitting, ask your doctor.  Eat healthy foods.  Exercise regularly.  Get plenty of sleep.  Avoid tobacco smoke and other things that can bother your lungs.  Wash your hands often with soap and water. This will help keep you from getting an infection. If you cannot use soap and water, use hand sanitizer.  During flu season, avoid areas that are crowded with people. General instructions  Drink enough fluid to keep your pee (urine) clear or pale yellow. Do not do this if your doctor has told you not to.  Use a cool mist machine (vaporizer).  If you use oxygen or a machine that turns medicine into a mist (nebulizer), continue to use it as told.  Follow all instructions for rehabilitation. These are steps you  can take to make your body work better.  Keep all follow-up visits as told by your doctor. This is important. Contact a doctor if:  Your COPD symptoms get worse than normal. Get help right away if:  You are short of breath and it gets worse.  You have trouble talking.  You have chest pain.  You cough up blood.  You have a fever.  You keep throwing up (vomiting).  You feel weak or you pass out (faint).  You feel confused.  You are not able to sleep because of your symptoms.  You are not able to do daily activities. Summary  COPD exacerbations are times that breathing gets worse and you need more treatment than normal.  COPD exacerbations can be very serious and may cause your lungs to become more damaged.  Do not smoke. If you need help quitting, ask your doctor.  Stay up-to-date on your shots. Get a flu shot every year. This information is not intended to replace advice given to you by your health care provider. Make sure you discuss any questions you have with your health care provider. Document Released: 07/01/2011 Document Revised: 08/16/2016 Document Reviewed: 08/16/2016 Elsevier Interactive Patient Education  2019 [employer].

## 2018-09-12 NOTE — Progress Notes (Signed)
Patient: Jon Yang Male    DOB: 1965/11/25   53 y.o.   MRN: [id number] Visit Date: 09/12/2018  Today's Provider: Trey Sailors, PA-C   Chief Complaint  Patient presents with  . URI   Subjective:     HPI Upper Respiratory Infection: Patient has a history of COPD and continues to smoke. He presents today and complains of symptoms of a URI. Symptoms include congestion and cough. Onset of symptoms was 1 week ago, gradually worsening since that time. He also c/o congestion, cough described as productive of green sputum, headache described as pressure and nasal congestion for the past 3 days .  He is drinking plenty of fluids. Evaluation to date: none. Treatment to date: cough suppressants. He had fevers, has been coughing. He forgets his trelegy three days per week. He has a nebulizer at home but hasn't replaced tubing in a year. He has seen [facility] pulmonology one year ago and never followed up, intended follow up was 3 months for spirometry. Taking Nyquil and other decongestants currently.     Allergies  Allergen Reactions  . Shellfish Allergy     Other reaction(s): Other (See Comments) Feels drunk Lightheaded     Current Outpatient Medications:  .  amLODipine (NORVASC) 10 MG tablet, TAKE 1 TABLET BY MOUTH EVERY DAY, Disp: 90 tablet, Rfl: 0 .  cholecalciferol (VITAMIN D) 1000 units tablet, Take 5,000 Units by mouth daily., Disp: , Rfl:  .  fluticasone (FLONASE) 50 MCG/ACT nasal spray, Place 1 spray into both nostrils 2 (two) times daily., Disp: , Rfl: 0 .  PROAIR HFA 108 (90 Base) MCG/ACT inhaler, Inhale 2 puffs into the lungs 4 (four) times daily as needed for wheezing or shortness of breath., Disp: 18 g, Rfl: 2 .  Spacer/Aero-Holding Chambers (OPTICHAMBER DIAMOND-SM MASK) MISC, Use with inhalers., Disp: , Rfl:  .  SUPER B COMPLEX/C PO, Take 1 tablet by mouth 2 (two) times daily., Disp: , Rfl:  .  Fluticasone-Umeclidin-Vilant 100-62.5-25 MCG/INH AEPB, Inhale 1 puff into the  lungs daily., Disp: , Rfl:  .  ipratropium (ATROVENT HFA) 17 MCG/ACT inhaler, Inhale 2 puffs into the lungs 3 (three) times daily., Disp: 1 Inhaler, Rfl: 12 .  ipratropium-albuterol (DUONEB) 0.5-2.5 (3) MG/3ML SOLN, Inhale 3 mLs into the lungs 4 (four) times daily as needed., Disp: 360 mL, Rfl: 5 .  lisinopril (PRINIVIL,ZESTRIL) 30 MG tablet, Take 30 mg by mouth daily., Disp: , Rfl:   Review of Systems  Constitutional: Positive for activity change and fatigue.  HENT: Positive for congestion, hearing loss and sinus pressure.   Respiratory: Positive for cough and shortness of breath.   Neurological: Positive for headaches.    Social History   Tobacco Use  . Smoking status: Current Every Day Smoker  . Smokeless tobacco: Never Used  Substance Use Topics  . Alcohol use: No      Objective:   BP (!) 170/109 (BP Location: Right Arm, Patient Position: Sitting, Cuff Size: Large)   Pulse 80   Temp 98.1 F (36.7 C) (Oral)   Resp 20   Wt 230 lb (104.3 kg)   SpO2 96%   BMI 32.54 kg/m  Vitals:   09/12/18 0930  BP: (!) 170/109  Pulse: 80  Resp: 20  Temp: 98.1 F (36.7 C)  TempSrc: Oral  SpO2: 96%  Weight: 230 lb (104.3 kg)     Physical Exam Constitutional:      Appearance: Normal appearance.  Cardiovascular:  Rate and Rhythm: Normal rate and regular rhythm.     Heart sounds: Normal heart sounds.  Pulmonary:     Effort: No tachypnea, accessory muscle usage or respiratory distress.     Breath sounds: Decreased air movement present. Wheezing and rhonchi present.     Comments: Air entry improves with duoneb.  Skin:    General: Skin is warm and dry.  Neurological:     Mental Status: He is alert and oriented to person, place, and time. Mental status is at baseline.         Assessment & Plan    1. COPD exacerbation (HCC)  Duoneb in office. Script written for breathing treatment supplies. ABx as below. CXR is cost prohibitive for patient. We can defer for now but he must  follow up if worsening and we will have to get imaging.  - ipratropium-albuterol (DUONEB) 0.5-2.5 (3) MG/3ML nebulizer solution 3 mL - doxycycline (VIBRA-TABS) 100 MG tablet; Take 1 tablet (100 mg total) by mouth 2 (two) times daily for 7 days.  Dispense: 14 tablet; Refill: 0 - predniSONE (STERAPRED UNI-PAK 21 TAB) 10 MG (21) TBPK tablet; Take 6 pills on day 1, take 5 pills on day 2, take 4 pills on day 3 and so on until complete.  Dispense: 21 tablet; Refill: 0  2. Cough  - DG Chest 2 View; Future  The entirety of the information documented in the History of Present Illness, Review of Systems and Physical Exam were personally obtained by me. Portions of this information were initially documented by Rondel Baton, CMA and reviewed by me for thoroughness and accuracy.   Return in about 2 months (around 11/11/2018) for spirometry .      Trey Sailors, PA-C  [facility]

## 2018-10-18 ENCOUNTER — Telehealth: Payer: Self-pay

## 2018-10-18 DIAGNOSIS — J449 Chronic obstructive pulmonary disease, unspecified: Secondary | ICD-10-CM

## 2018-10-18 MED ORDER — FLUTICASONE-UMECLIDIN-VILANT 100-62.5-25 MCG/INH IN AEPB: 1.0000 | INHALATION_SPRAY | Freq: Every day | RESPIRATORY_TRACT | 1 refills | Status: DC

## 2018-10-18 NOTE — Telephone Encounter (Signed)
Refilled

## 2018-10-18 NOTE — Telephone Encounter (Signed)
Pt needs refills on:   Trelegy 16mcg/6.25mcg/25mcg 1 puff daily.   Thanks,   -Vernona Rieger

## 2018-12-11 ENCOUNTER — Ambulatory Visit: Payer: Self-pay | Admitting: Physician Assistant

## 2019-02-09 DIAGNOSIS — H524 Presbyopia: Secondary | ICD-10-CM | POA: Diagnosis not present

## 2019-02-18 ENCOUNTER — Other Ambulatory Visit: Payer: Self-pay | Admitting: Physician Assistant

## 2019-02-18 DIAGNOSIS — J449 Chronic obstructive pulmonary disease, unspecified: Secondary | ICD-10-CM

## 2019-03-06 ENCOUNTER — Other Ambulatory Visit: Payer: Self-pay

## 2019-03-06 ENCOUNTER — Ambulatory Visit (INDEPENDENT_AMBULATORY_CARE_PROVIDER_SITE_OTHER): Payer: 59 | Admitting: Physician Assistant

## 2019-03-06 ENCOUNTER — Encounter: Payer: Self-pay | Admitting: Physician Assistant

## 2019-03-06 VITALS — BP 168/110 | HR 73 | Temp 98.3°F | Wt 238.0 lb

## 2019-03-06 DIAGNOSIS — Z1211 Encounter for screening for malignant neoplasm of colon: Secondary | ICD-10-CM | POA: Diagnosis not present

## 2019-03-06 DIAGNOSIS — K5900 Constipation, unspecified: Secondary | ICD-10-CM | POA: Diagnosis not present

## 2019-03-06 DIAGNOSIS — I10 Essential (primary) hypertension: Secondary | ICD-10-CM

## 2019-03-06 DIAGNOSIS — I69359 Hemiplegia and hemiparesis following cerebral infarction affecting unspecified side: Secondary | ICD-10-CM

## 2019-03-06 DIAGNOSIS — R109 Unspecified abdominal pain: Secondary | ICD-10-CM | POA: Diagnosis not present

## 2019-03-06 DIAGNOSIS — I2699 Other pulmonary embolism without acute cor pulmonale: Secondary | ICD-10-CM | POA: Diagnosis not present

## 2019-03-06 MED ORDER — LISINOPRIL-HYDROCHLOROTHIAZIDE 20-12.5 MG PO TABS: 1.0000 | ORAL_TABLET | Freq: Every day | ORAL | 0 refills | Status: DC

## 2019-03-06 MED ORDER — LISINOPRIL-HYDROCHLOROTHIAZIDE 20-12.5 MG PO TABS: 1.0000 | ORAL_TABLET | Freq: Two times a day (BID) | ORAL | 0 refills | Status: DC

## 2019-03-06 NOTE — Progress Notes (Signed)
Patient: Jon Yang Male    DOB: 1966/02/09   53 y.o.   MRN: [id number] Visit Date: 03/06/2019  Today's Provider: Trinna Post, PA-C   Chief Complaint  Patient presents with  . Constipation    Started a least a month ago.   Subjective:    Usually has bowel movements 3 times daily. Now, having bowel movements once daily. Now, getting sausage biscuits in the morning, fast food at lunch, and fast food for dinner. No vegetables or fruits during the day. Previously was eating healthier. Drinks coffee in the morning, a pot. Drinks water and green tea. Drinks milkshakes very sparingly.   Constipation This is a new problem. The current episode started more than 1 month ago. The problem has been gradually worsening since onset. His stool frequency is 2 to 3 times per week. Associated symptoms include abdominal pain and nausea. Pertinent negatives include no diarrhea, fever, rectal pain or vomiting. He has tried laxatives and diet changes for the symptoms. The treatment provided mild relief.   Has tried OTC laxatives and prune juice.   HTN: he is currently taking lisinopril 30 mg daily and amlodipine 10 mg daily. He has had financial constraints and hasn't had labwork or regular followups. He continues to smoke two packs per day.   BP Readings from Last 3 Encounters:  03/06/19 (!) 168/110  09/12/18 (!) 170/109  05/10/18 (!) 140/98    Allergies  Allergen Reactions  . Shellfish Allergy     Other reaction(s): Other (See Comments) Feels drunk Lightheaded     Current Outpatient Medications:  .  amLODipine (NORVASC) 10 MG tablet, TAKE 1 TABLET BY MOUTH EVERY DAY, Disp: 90 tablet, Rfl: 0 .  cholecalciferol (VITAMIN D) 1000 units tablet, Take 5,000 Units by mouth daily., Disp: , Rfl:  .  fluticasone (FLONASE) 50 MCG/ACT nasal spray, Place 1 spray into both nostrils 2 (two) times daily., Disp: , Rfl: 0 .  ipratropium (ATROVENT HFA) 17 MCG/ACT inhaler, Inhale 2 puffs into the lungs  3 (three) times daily., Disp: 1 Inhaler, Rfl: 12 .  ipratropium-albuterol (DUONEB) 0.5-2.5 (3) MG/3ML SOLN, Inhale 3 mLs into the lungs 4 (four) times daily as needed., Disp: 360 mL, Rfl: 5 .  lisinopril (PRINIVIL,ZESTRIL) 30 MG tablet, Take 30 mg by mouth daily., Disp: , Rfl:  .  PROAIR HFA 108 (90 Base) MCG/ACT inhaler, Inhale 2 puffs into the lungs 4 (four) times daily as needed for wheezing or shortness of breath., Disp: 18 g, Rfl: 2 .  Spacer/Aero-Holding Chambers (OPTICHAMBER DIAMOND-SM MASK) MISC, Use with inhalers., Disp: , Rfl:  .  SUPER B COMPLEX/C PO, Take 1 tablet by mouth 2 (two) times daily., Disp: , Rfl:  .  TRELEGY ELLIPTA 100-62.5-25 MCG/INH AEPB, TAKE 1 PUFF BY MOUTH EVERY DAY, Disp: 60 each, Rfl: 1 .  predniSONE (STERAPRED UNI-PAK 21 TAB) 10 MG (21) TBPK tablet, Take 6 pills on day 1, take 5 pills on day 2, take 4 pills on day 3 and so on until complete., Disp: 21 tablet, Rfl: 0  Current Facility-Administered Medications:  .  ipratropium-albuterol (DUONEB) 0.5-2.5 (3) MG/3ML nebulizer solution 3 mL, 3 mL, Nebulization, Once, Chalmer Zheng M, PA-C  Review of Systems  Constitutional: Positive for diaphoresis (Only on Sunday). Negative for activity change, appetite change, chills, fatigue, fever and unexpected weight change.  Gastrointestinal: Positive for abdominal distention, abdominal pain, constipation and nausea. Negative for anal bleeding, blood in stool, diarrhea, rectal pain and vomiting.  Neurological: Negative for dizziness, light-headedness and headaches.    Social History   Tobacco Use  . Smoking status: Current Every Day Smoker  . Smokeless tobacco: Never Used  Substance Use Topics  . Alcohol use: No      Objective:   BP (!) 168/110 (BP Location: Left Arm, Patient Position: Sitting, Cuff Size: Large)   Pulse 73   Temp 98.3 F (36.8 C) (Oral)   Wt 238 lb (108 kg)   BMI 33.67 kg/m  Vitals:   03/06/19 0826  BP: (!) 168/110  Pulse: 73  Temp: 98.3 F  (36.8 C)  TempSrc: Oral  Weight: 238 lb (108 kg)     Physical Exam Constitutional:      Appearance: Normal appearance.  Cardiovascular:     Rate and Rhythm: Normal rate and regular rhythm.     Heart sounds: Normal heart sounds.  Pulmonary:     Effort: Pulmonary effort is normal.     Breath sounds: Normal breath sounds.  Skin:    General: Skin is warm and dry.  Neurological:     Mental Status: He is alert and oriented to person, place, and time. Mental status is at baseline.  Psychiatric:        Mood and Affect: Mood normal.        Behavior: Behavior normal.      No results found for any visits on 03/06/19.     Assessment & Plan    1. Constipation, unspecified constipation type  I think his constipation is due to lack of fiber from his recent diet changes. He used to have a bowel movement 3 times daily and now is having a bowel movement once daily. He has tried OTC laxatives but not consistently. He is currently eating large amounts of fast food and very little to no fiber. Counseled on increasing fiber through his diet as well as doing fiber supplements and laxatives.   2. Essential hypertension  Continue amlodipine 10 mg. Will change lisinopril 30 mg daily to lisinopril-HCTZ 20-12.5 mg twice daily. Checking labs today. I would like him to check his BP daily and call back in one month with the readings.   - Comprehensive Metabolic Panel (CMET) - CBC with Differential - lisinopril-hydrochlorothiazide (ZESTORETIC) 20-12.5 MG tablet; Take 1 tablet by mouth 2 (two) times daily.  Dispense: 180 tablet; Refill: 0  3. Abdominal pain, unspecified abdominal location  He has some financial constraints. I think his abdominal pain is due to constipation but I do have concern for other pathology including liver, gallbladder, pancreas, aorta.   - Comprehensive Metabolic Panel (CMET) - CBC with Differential - Lipid Profile - Lipase - Amylase  4. Colon cancer screening  He has  not completed colon cancer screening.   - Cologuard  .The entirety of the information documented in the History of Present Illness, Review of Systems and Physical Exam were personally obtained by me. Portions of this information were initially documented by Kavin LeechLaura Walsh, CMA and reviewed by me for thoroughness and accuracy.   F/u 1 month with phone call for BP readings    Trey SailorsAdriana M Rosalba Totty, PA-C  [facility]

## 2019-03-06 NOTE — Patient Instructions (Signed)
Psyllium supplements. Can also stir one capful or Miralax into liquid daily. Please eat more fiber.    Constipation, Adult Constipation is when a person:  Poops (has a bowel movement) fewer times in a week than normal.  Has a hard time pooping.  Has poop that is dry, hard, or bigger than normal. Follow these instructions at home: Eating and drinking   Eat foods that have a lot of fiber, such as: ? Fresh fruits and vegetables. ? Whole grains. ? Beans.  Eat less of foods that are high in fat, low in fiber, or overly processed, such as: ? [place] fries. ? Hamburgers. ? Cookies. ? Candy. ? Soda.  Drink enough fluid to keep your pee (urine) clear or pale yellow. General instructions  Exercise regularly or as told by your doctor.  Go to the restroom when you feel like you need to poop. Do not hold it in.  Take over-the-counter and prescription medicines only as told by your doctor. These include any fiber supplements.  Do pelvic floor retraining exercises, such as: ? Doing deep breathing while relaxing your lower belly (abdomen). ? Relaxing your pelvic floor while pooping.  Watch your condition for any changes.  Keep all follow-up visits as told by your doctor. This is important. Contact a doctor if:  You have pain that gets worse.  You have a fever.  You have not pooped for 4 days.  You throw up (vomit).  You are not hungry.  You lose weight.  You are bleeding from the anus.  You have thin, pencil-like poop (stool). Get help right away if:  You have a fever, and your symptoms suddenly get worse.  You leak poop or have blood in your poop.  Your belly feels hard or bigger than normal (is bloated).  You have very bad belly pain.  You feel dizzy or you faint. This information is not intended to replace advice given to you by your health care provider. Make sure you discuss any questions you have with your health care provider. Document Released:  12/29/2007 Document Revised: 06/24/2017 Document Reviewed: 12/31/2015 Elsevier Patient Education  2020 [employer].

## 2019-03-07 ENCOUNTER — Telehealth: Payer: Self-pay

## 2019-03-07 LAB — COMPREHENSIVE METABOLIC PANEL
ALT: 21 IU/L (ref 0–44)
AST: 18 IU/L (ref 0–40)
Albumin/Globulin Ratio: 1.7 (ref 1.2–2.2)
Albumin: 4.3 g/dL (ref 3.8–4.9)
Alkaline Phosphatase: 111 IU/L (ref 39–117)
BUN/Creatinine Ratio: 15 (ref 9–20)
BUN: 16 mg/dL (ref 6–24)
Bilirubin Total: 1.5 mg/dL — ABNORMAL HIGH (ref 0.0–1.2)
CO2: 20 mmol/L (ref 20–29)
Calcium: 8.8 mg/dL (ref 8.7–10.2)
Chloride: 103 mmol/L (ref 96–106)
Creatinine, Ser: 1.07 mg/dL (ref 0.76–1.27)
GFR calc Af Amer: 92 mL/min/{1.73_m2} (ref >59)
GFR calc non Af Amer: 79 mL/min/{1.73_m2} (ref >59)
Globulin, Total: 2.6 g/dL (ref 1.5–4.5)
Glucose: 77 mg/dL (ref 65–99)
Potassium: 4.1 mmol/L (ref 3.5–5.2)
Sodium: 139 mmol/L (ref 134–144)
Total Protein: 6.9 g/dL (ref 6.0–8.5)

## 2019-03-07 LAB — CBC WITH DIFFERENTIAL/PLATELET
Basophils Absolute: 0.1 x10E3/uL (ref 0.0–0.2)
Basos: 1 %
EOS (ABSOLUTE): 0.2 x10E3/uL (ref 0.0–0.4)
Eos: 2 %
Hematocrit: 51.9 % — ABNORMAL HIGH (ref 37.5–51.0)
Hemoglobin: 18.3 g/dL — ABNORMAL HIGH (ref 13.0–17.7)
Immature Grans (Abs): 0 x10E3/uL (ref 0.0–0.1)
Immature Granulocytes: 0 %
Lymphocytes Absolute: 2.2 x10E3/uL (ref 0.7–3.1)
Lymphs: 20 %
MCH: 32.2 pg (ref 26.6–33.0)
MCHC: 35.3 g/dL (ref 31.5–35.7)
MCV: 91 fL (ref 79–97)
Monocytes Absolute: 1 x10E3/uL — ABNORMAL HIGH (ref 0.1–0.9)
Monocytes: 9 %
Neutrophils Absolute: 7.3 x10E3/uL — ABNORMAL HIGH (ref 1.4–7.0)
Neutrophils: 68 %
Platelets: 246 x10E3/uL (ref 150–450)
RBC: 5.68 x10E6/uL (ref 4.14–5.80)
RDW: 12.9 % (ref 11.6–15.4)
WBC: 10.9 x10E3/uL — ABNORMAL HIGH (ref 3.4–10.8)

## 2019-03-07 LAB — AMYLASE: Amylase: 79 U/L (ref 31–110)

## 2019-03-07 LAB — LIPID PANEL
Chol/HDL Ratio: 6.1 ratio — ABNORMAL HIGH (ref 0.0–5.0)
Cholesterol, Total: 224 mg/dL — ABNORMAL HIGH (ref 100–199)
HDL: 37 mg/dL — ABNORMAL LOW (ref >39)
LDL Calculated: 155 mg/dL — ABNORMAL HIGH (ref 0–99)
Triglycerides: 162 mg/dL — ABNORMAL HIGH (ref 0–149)
VLDL Cholesterol Cal: 32 mg/dL (ref 5–40)

## 2019-03-07 LAB — LIPASE: Lipase: 71 U/L (ref 13–78)

## 2019-03-07 NOTE — Telephone Encounter (Signed)
Patient notified of lab results. He states that he is doing well today. Follow up appointment scheduled.

## 2019-03-07 NOTE — Telephone Encounter (Signed)
-----   Message from Trinna Post, [place] sent at 03/07/2019 11:10 AM EDT ----- His kidney and liver function look OK. His white blood cells are slightly high which is stable from last labwork. His cholesterol is uncontrolled and we need to address that when we follow up. His pancreatic enzymes are normal. How is he feeling today? I think it is fine to go forward with the treatments for constipation and then touch base if they are not working.

## 2019-03-23 ENCOUNTER — Ambulatory Visit: Payer: Managed Care, Other (non HMO)

## 2019-03-23 ENCOUNTER — Other Ambulatory Visit: Payer: Self-pay

## 2019-03-26 ENCOUNTER — Ambulatory Visit: Payer: Managed Care, Other (non HMO) | Admitting: Internal Medicine

## 2019-04-11 ENCOUNTER — Ambulatory Visit: Payer: Self-pay | Admitting: Physician Assistant

## 2019-04-22 ENCOUNTER — Other Ambulatory Visit: Payer: Self-pay | Admitting: Physician Assistant

## 2019-04-22 DIAGNOSIS — I10 Essential (primary) hypertension: Secondary | ICD-10-CM

## 2019-05-08 ENCOUNTER — Encounter: Payer: Self-pay | Admitting: Occupational Medicine

## 2019-05-08 ENCOUNTER — Other Ambulatory Visit: Payer: Self-pay

## 2019-05-08 ENCOUNTER — Ambulatory Visit: Payer: Managed Care, Other (non HMO) | Admitting: Occupational Medicine

## 2019-05-08 VITALS — BP 168/106 | HR 80 | Temp 98.3°F | Resp 16 | Ht 69.0 in | Wt 237.0 lb

## 2019-05-08 DIAGNOSIS — Z0000 Encounter for general adult medical examination without abnormal findings: Secondary | ICD-10-CM

## 2019-05-08 LAB — POCT URINALYSIS DIPSTICK
Bilirubin, UA: NEGATIVE
Blood, UA: NEGATIVE
Glucose, UA: NEGATIVE
Ketones, UA: NEGATIVE
Leukocytes, UA: NEGATIVE
Nitrite, UA: NEGATIVE
Protein, UA: NEGATIVE
Spec Grav, UA: 1.015 (ref 1.010–1.025)
Urobilinogen, UA: 0.2 E.U./dL
pH, UA: 6 (ref 5.0–8.0)

## 2019-05-08 NOTE — Progress Notes (Signed)
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  Last visit was 08/2018.  Doesn't take BP med regularly.  AMD

## 2019-06-06 ENCOUNTER — Other Ambulatory Visit: Payer: Self-pay

## 2019-06-06 ENCOUNTER — Telehealth: Payer: Self-pay | Admitting: General Practice

## 2019-06-06 DIAGNOSIS — Z20822 Contact with and (suspected) exposure to covid-19: Secondary | ICD-10-CM

## 2019-06-06 NOTE — Telephone Encounter (Signed)
Patient was instructed to go to [facility] for COVID testing and to call this office with his test results.

## 2019-06-06 NOTE — Telephone Encounter (Signed)
In the last 24 hours have you experienced any of these symptoms  Difficulty breathing  no  Nasal congestion  yes  New cough  some  Runny nose  yes  Shortness of breath  no  New sore throat  yes  Unexplained body aches  no  Nausea or vomiting  no  Diarrhea  no  Loss of taste or smell  no  Fever (temp>100 F/37.8 C) or chills   96.7  No chills  Exposure:   Have you been in close contact with someone with confirmed diagnosis of COVID or person under going testing in past 14 days?  no   Have you been tested for COVID? If yes date, location, results in known  no   Have you been living in the same home as a person with confirmed diagnosis of COVID or a person undergoing testing? (household contact)  no   Have you been diagnosed with COVID or are you waiting on test results?  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 He was spreading straw. No dust mask was wore. He has known allergies. He takes zyrtec for allergies and coriciden.   He was informed to go home and await instructions. I also told him that I would release the his information to the covidhotline.

## 2019-06-08 LAB — NOVEL CORONAVIRUS, NAA: SARS-CoV-2, NAA: NOT DETECTED

## 2019-06-14 ENCOUNTER — Other Ambulatory Visit: Payer: Self-pay | Admitting: Physician Assistant

## 2019-06-14 DIAGNOSIS — J449 Chronic obstructive pulmonary disease, unspecified: Secondary | ICD-10-CM

## 2019-08-01 ENCOUNTER — Other Ambulatory Visit: Payer: Self-pay

## 2019-08-01 ENCOUNTER — Ambulatory Visit (INDEPENDENT_AMBULATORY_CARE_PROVIDER_SITE_OTHER): Payer: Managed Care, Other (non HMO) | Admitting: Physician Assistant

## 2019-08-01 ENCOUNTER — Encounter: Payer: Self-pay | Admitting: Physician Assistant

## 2019-08-01 VITALS — BP 179/105 | HR 73 | Temp 97.5°F | Wt 235.2 lb

## 2019-08-01 DIAGNOSIS — M25561 Pain in right knee: Secondary | ICD-10-CM

## 2019-08-01 DIAGNOSIS — I69359 Hemiplegia and hemiparesis following cerebral infarction affecting unspecified side: Secondary | ICD-10-CM | POA: Diagnosis not present

## 2019-08-01 DIAGNOSIS — M1711 Unilateral primary osteoarthritis, right knee: Secondary | ICD-10-CM | POA: Diagnosis not present

## 2019-08-01 DIAGNOSIS — G8929 Other chronic pain: Secondary | ICD-10-CM | POA: Diagnosis not present

## 2019-08-01 DIAGNOSIS — Z1211 Encounter for screening for malignant neoplasm of colon: Secondary | ICD-10-CM

## 2019-08-01 MED ORDER — PREDNISONE 10 MG PO TABS: 10.0000 mg | ORAL_TABLET | Freq: Every day | ORAL | 0 refills | Status: DC

## 2019-08-01 NOTE — Progress Notes (Signed)
Patient: Jon Yang Male    DOB: 01/02/1966   54 y.o.   MRN: [id number] Visit Date: 08/01/2019  Today's Provider: Trey Sailors, PA-C   Chief Complaint  Patient presents with  . Knee Pain   Subjective:    I, [name],CMA am acting as a scribe for [place].   Knee Pain  The incident occurred more than 1 week ago. There was no injury mechanism. The pain is present in the right knee. The quality of the pain is described as stabbing. The pain is at a severity of 6/10. The pain is severe. The pain has been intermittent since onset. Associated symptoms include an inability to bear weight and a loss of motion. Pertinent negatives include no muscle weakness, numbness or tingling. He reports no foreign bodies present. The symptoms are aggravated by movement and weight bearing. He has tried non-weight bearing for the symptoms. The treatment provided mild relief.   Has been hurting for one week. 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 He has been told he has arthritis in both knees.  HTN: He is not taking his medications.   BP Readings from Last 3 Encounters:  08/01/19 (!) 179/105  05/08/19 (!) 168/106  03/06/19 (!) 168/110     Allergies  Allergen Reactions  . Shellfish Allergy     Other reaction(s): Other (See Comments) Feels drunk Lightheaded     Current Outpatient Medications:  .  amLODipine (NORVASC) 10 MG tablet, TAKE 1 TABLET BY MOUTH EVERY DAY, Disp: 90 tablet, Rfl: 0 .  cholecalciferol (VITAMIN D) 1000 units tablet, Take 5,000 Units by mouth daily., Disp: , Rfl:  .  fluticasone (FLONASE) 50 MCG/ACT nasal spray, Place 1 spray into both nostrils 2 (two) times daily., Disp: , Rfl: 0 .  ipratropium-albuterol (DUONEB) 0.5-2.5 (3) MG/3ML SOLN, Inhale 3 mLs into the lungs 4 (four) times daily as needed., Disp: 360 mL, Rfl: 5 .  lisinopril-hydrochlorothiazide (ZESTORETIC) 20-12.5 MG tablet, TAKE 1  TABLET BY MOUTH TWICE A DAY, Disp: 180 tablet, Rfl: 0 .  Spacer/Aero-Holding Chambers (OPTICHAMBER DIAMOND-SM MASK) MISC, Use with inhalers., Disp: , Rfl:  .  SUPER B COMPLEX/C PO, Take 1 tablet by mouth 2 (two) times daily., Disp: , Rfl:  .  TRELEGY ELLIPTA 100-62.5-25 MCG/INH AEPB, INHALE 1 PUFF BY MOUTH EVERY DAY, Disp: 60 each, Rfl: 1 .  ipratropium (ATROVENT HFA) 17 MCG/ACT inhaler, Inhale 2 puffs into the lungs 3 (three) times daily. (Patient not taking: Reported on 05/08/2019), Disp: 1 Inhaler, Rfl: 12 .  PROAIR HFA 108 (90 Base) MCG/ACT inhaler, Inhale 2 puffs into the lungs 4 (four) times daily as needed for wheezing or shortness of breath. (Patient not taking: Reported on 08/01/2019), Disp: 18 g, Rfl: 2  Current Facility-Administered Medications:  .  ipratropium-albuterol (DUONEB) 0.5-2.5 (3) MG/3ML nebulizer solution 3 mL, 3 mL, Nebulization, Once, Angeles Paolucci M, PA-C  Review of Systems  Neurological: Negative for tingling and numbness.    Social History   Tobacco Use  . Smoking status: Current Every Day Smoker  . Smokeless tobacco: Never Used  Substance Use Topics  . Alcohol use: No      Objective:   BP (!) 179/105 (BP Location: Left Arm, Patient Position: Sitting, Cuff Size: Large)   Pulse 73   Temp (!) 97.5 F (36.4 C) (Temporal)   Wt 235 lb 3.2 oz (106.7 kg)   BMI 34.73 kg/m  Vitals:   08/01/19 1440  BP: (!) 179/105  Pulse: 73  Temp: (!) 97.5 F (36.4 C)  TempSrc: Temporal  Weight: 235 lb 3.2 oz (106.7 kg)  Body mass index is 34.73 kg/m.   Physical Exam Constitutional:      Appearance: Normal appearance.  Cardiovascular:     Rate and Rhythm: Normal rate and regular rhythm.  Pulmonary:     Breath sounds: Wheezing present.  Musculoskeletal:     Right knee: Crepitus present.     Left knee: Crepitus present.  Skin:    General: Skin is warm and dry.  Neurological:     Mental Status: He is alert and oriented to person, place, and time. Mental status  is at baseline.  Psychiatric:        Mood and Affect: Mood normal.        Behavior: Behavior normal.      No results found for any visits on 08/01/19.     Assessment & Plan    1. Chronic pain of right knee  Explained chronic nature of arthritis and various treatments.   - predniSONE (DELTASONE) 10 MG tablet; Take 1 tablet (10 mg total) by mouth daily with breakfast. Take 6 pills on day 1, take 5 pills on day 2 and so on until complete.  Dispense: 21 tablet; Refill: 0 - DG Knee Complete 4 Views Right; Future  2. Colon cancer screening  - Cologuard  3. Osteoarthritis of right knee, unspecified osteoarthritis type   4. History of hemorrhagic stroke with residual hemiparesis (HCC)  The entirety of the information documented in the History of Present Illness, Review of Systems and Physical Exam were personally obtained by me. Portions of this information were initially documented by [facility] and reviewed by me for thoroughness and accuracy.        Trinna Post, PA-C  [facility]

## 2019-08-01 NOTE — Patient Instructions (Signed)
Arthritis Arthritis means joint pain. It can also mean joint disease. A joint is a place where bones come together. There are more than 100 types of arthritis. What are the causes? This condition may be caused by:  Wear and tear of a joint. This is the most common cause.  A lot of acid in the blood, which leads to pain in the joint (gout).  Pain and swelling (inflammation) in a joint.  Infection of a joint.  Injuries in the joint.  A reaction to medicines (allergy). In some cases, the cause may not be known. What are the signs or symptoms? Symptoms of this condition include:  Redness at a joint.  Swelling at a joint.  Stiffness at a joint.  Warmth coming from the joint.  A fever.  A feeling of being sick. How is this treated? This condition may be treated with:  Treating the cause, if it is known.  Rest.  Raising (elevating) the joint.  Putting cold or hot packs on the joint.  Medicines to treat symptoms and reduce pain and swelling.  Shots of medicines (cortisone) into the joint. You may also be told to make changes in your life, such as doing exercises and losing weight. Follow these instructions at home: Medicines  Take over-the-counter and prescription medicines only as told by your doctor.  Do not take aspirin for pain if your doctor says that you may have gout. Activity  Rest your joint if your doctor tells you to.  Avoid activities that make the pain worse.  Exercise your joint regularly as told by your doctor. Try doing exercises like: ? Swimming. ? Water aerobics. ? Biking. ? Walking. Managing pain, stiffness, and swelling      If told, put ice on the affected area. ? Put ice in a plastic bag. ? Place a towel between your skin and the bag. ? Leave the ice on for 20 minutes, 2-3 times per day.  If your joint is swollen, raise (elevate) it above the level of your heart if told by your doctor.  If your joint feels stiff in the morning,  try taking a warm shower.  If told, put heat on the affected area. Do this as often as told by your doctor. Use the heat source that your doctor recommends, such as a moist heat pack or a heating pad. If you have diabetes, do not apply heat without asking your doctor. To apply heat: ? Place a towel between your skin and the heat source. ? Leave the heat on for 20-30 minutes. ? Remove the heat if your skin turns bright red. This is very important if you are unable to feel pain, heat, or cold. You may have a greater risk of getting burned. General instructions  Do not use any products that contain nicotine or tobacco, such as cigarettes, e-cigarettes, and chewing tobacco. If you need help quitting, ask your doctor.  Keep all follow-up visits as told by your doctor. This is important. Contact a doctor if:  The pain gets worse.  You have a fever. Get help right away if:  You have very bad pain in your joint.  You have swelling in your joint.  Your joint is red.  Many joints become painful and swollen.  You have very bad back pain.  Your leg is very weak.  You cannot control your pee (urine) or poop (stool). Summary  Arthritis means joint pain. It can also mean joint disease. A joint is a place   where bones come together.  The most common cause of this condition is wear and tear of a joint.  Symptoms of this condition include redness, swelling, or stiffness of the joint.  This condition is treated with rest, raising the joint, medicines, and putting cold or hot packs on the joint.  Follow your doctor's instructions about medicines, activity, exercises, and other home care treatments. This information is not intended to replace advice given to you by your health care provider. Make sure you discuss any questions you have with your health care provider. Document Revised: 06/19/2018 Document Reviewed: 06/19/2018 Elsevier Patient Education  2020 Elsevier Inc.  

## 2019-08-03 ENCOUNTER — Encounter: Payer: Self-pay | Admitting: Physician Assistant

## 2019-08-07 NOTE — Progress Notes (Signed)
DOT Exam done at [facility]. Documentation in Paper Chart housed at [facility]

## 2019-09-07 ENCOUNTER — Ambulatory Visit: Payer: Self-pay

## 2019-09-07 ENCOUNTER — Other Ambulatory Visit: Payer: Self-pay

## 2019-09-07 DIAGNOSIS — T148XXA Other injury of unspecified body region, initial encounter: Secondary | ICD-10-CM

## 2019-09-07 NOTE — Progress Notes (Signed)
Small 3-4 mm laceration on upper left 5th finger.  Able to bend without difficulty.  Jon Yang, CMA cleaned wound with normal saline & 1/2 strength H2O2 spray. No redness or drainage.  Fingertip slightly swollen & tender to the touch.  Neosporin ointment applied along with a bandaid. Nitrile gloves given & advised to keep covered while working.  Will return or follow-up if S/Sx of infection.  AMD

## 2019-09-07 NOTE — Progress Notes (Signed)
Patient comes in today for a laceration on left 5th digit. DOI:09/07/2019. Patient is up to date on tetanus vaccine.Soaked wound in sterile saline and hydrogen peroxide solution.

## 2019-10-25 ENCOUNTER — Ambulatory Visit (INDEPENDENT_AMBULATORY_CARE_PROVIDER_SITE_OTHER): Payer: 59 | Admitting: Physician Assistant

## 2019-10-25 ENCOUNTER — Encounter: Payer: Self-pay | Admitting: Physician Assistant

## 2019-10-25 ENCOUNTER — Other Ambulatory Visit: Payer: Self-pay

## 2019-10-25 VITALS — BP 148/100 | HR 80 | Temp 97.7°F | Wt 236.2 lb

## 2019-10-25 DIAGNOSIS — E785 Hyperlipidemia, unspecified: Secondary | ICD-10-CM | POA: Insufficient documentation

## 2019-10-25 DIAGNOSIS — I2699 Other pulmonary embolism without acute cor pulmonale: Secondary | ICD-10-CM | POA: Diagnosis not present

## 2019-10-25 DIAGNOSIS — D72829 Elevated white blood cell count, unspecified: Secondary | ICD-10-CM | POA: Diagnosis not present

## 2019-10-25 DIAGNOSIS — D582 Other hemoglobinopathies: Secondary | ICD-10-CM

## 2019-10-25 DIAGNOSIS — Z1211 Encounter for screening for malignant neoplasm of colon: Secondary | ICD-10-CM | POA: Diagnosis not present

## 2019-10-25 DIAGNOSIS — R21 Rash and other nonspecific skin eruption: Secondary | ICD-10-CM

## 2019-10-25 DIAGNOSIS — I10 Essential (primary) hypertension: Secondary | ICD-10-CM | POA: Diagnosis not present

## 2019-10-25 MED ORDER — ATORVASTATIN CALCIUM 10 MG PO TABS: 10.0000 mg | ORAL_TABLET | Freq: Every day | ORAL | 0 refills | Status: DC

## 2019-10-25 MED ORDER — DOXYCYCLINE HYCLATE 100 MG PO TABS: 100.0000 mg | ORAL_TABLET | Freq: Two times a day (BID) | ORAL | 0 refills | Status: AC

## 2019-10-25 MED ORDER — METOPROLOL TARTRATE 25 MG PO TABS: 25.0000 mg | ORAL_TABLET | Freq: Two times a day (BID) | ORAL | 0 refills | Status: DC

## 2019-10-25 NOTE — Progress Notes (Signed)
Patient: Jon Yang Male    DOB: 1965-12-07   55 y.o.   MRN: [id number] Visit Date: 10/25/2019  Today's Provider: Trinna Post, PA-C   Chief Complaint  Patient presents with  . Ankle Pain  . Rash   Subjective:    I, [name],CMA am acting as a Education administrator for [employer].   Ankle Pain  The incident occurred more than 1 week ago. There was no injury mechanism. The pain is present in the right ankle and right foot. The quality of the pain is described as burning and stabbing. The pain is at a severity of 8/10. The pain is moderate. The pain has been intermittent since onset. Associated symptoms include an inability to bear weight and numbness. Pertinent negatives include no tingling. He reports no foreign bodies present. The symptoms are aggravated by movement and weight bearing. He has tried nothing for the symptoms. The treatment provided no relief.  Rash This is a new problem. The current episode started in the past 7 days. The problem has been gradually worsening since onset. The affected locations include the right lower leg and right ankle. The rash is characterized by dryness, burning, draining, redness, scaling, itchiness and pain. He was exposed to nothing. Pertinent negatives include no congestion. Past treatments include moisturizer. The treatment provided mild relief.   Reports this rash has been present in some capacity for years. He says he was bitten by something years ago and generally maintains a red area on the medial aspect of his right ankle constantly. Every so often it will flare as it is doing today. He was seen for this last year and treated with doxycycline. He does not remember if this helped. A swab was taken which did not grow any particular bacteria. He was referred to wound clinic which he did not go to.  HTN: Uncontrolled currently on amlodipine 10 mg QD, and Lisinopril-HCTZ 20-12.5 mg BID. He smokes 1.5 packer per day and is s/p stroke.    BP Readings from Last 8 Encounters:  10/25/19 (!) 148/100  08/01/19 (!) 179/105  05/08/19 (!) 168/106  03/06/19 (!) 168/110  09/12/18 (!) 170/109  05/10/18 (!) 140/98  02/24/18 140/68  11/03/17 112/78   Pulse Readings from Last 3 Encounters:  10/25/19 80  08/01/19 73  05/08/19 80   HLD: Cholesterol noted to be elevated last year. Patient was instructed to follow up regarding cholesterol medication and he did not. Is not currently on a statin medication.   Lipid Panel     Component Value Date/Time   CHOL 224 (H) 03/06/2019 0855   TRIG 162 (H) 03/06/2019 0855   HDL 37 (L) 03/06/2019 0855   CHOLHDL 6.1 (H) 03/06/2019 0855   LDLCALC 155 (H) 03/06/2019 0855   LABVLDL 32 03/06/2019 0855    Allergies  Allergen Reactions  . Shellfish Allergy     Other reaction(s): Other (See Comments) Feels drunk Lightheaded     Current Outpatient Medications:  .  amLODipine (NORVASC) 10 MG tablet, TAKE 1 TABLET BY MOUTH EVERY DAY, Disp: 90 tablet, Rfl: 0 .  cholecalciferol (VITAMIN D) 1000 units tablet, Take 5,000 Units by mouth daily., Disp: , Rfl:  .  fluticasone (FLONASE) 50 MCG/ACT nasal spray, Place 1 spray into both nostrils 2 (two) times daily., Disp: , Rfl: 0 .  ipratropium-albuterol (DUONEB) 0.5-2.5 (3) MG/3ML SOLN, Inhale 3 mLs into the lungs 4 (four) times daily as needed., Disp: 360 mL, Rfl: 5 .  lisinopril-hydrochlorothiazide (ZESTORETIC) 20-12.5 MG tablet, TAKE 1 TABLET BY MOUTH TWICE A DAY, Disp: 180 tablet, Rfl: 0 .  predniSONE (DELTASONE) 10 MG tablet, Take 1 tablet (10 mg total) by mouth daily with breakfast. Take 6 pills on day 1, take 5 pills on day 2 and so on until complete., Disp: 21 tablet, Rfl: 0 .  Spacer/Aero-Holding Chambers (OPTICHAMBER DIAMOND-SM MASK) MISC, Use with inhalers., Disp: , Rfl:  .  SUPER B COMPLEX/C PO, Take 1 tablet by mouth 2 (two) times daily., Disp: , Rfl:  .  TRELEGY ELLIPTA 100-62.5-25 MCG/INH AEPB, INHALE 1 PUFF BY MOUTH EVERY DAY, Disp: 60  each, Rfl: 1  Current Facility-Administered Medications:  .  ipratropium-albuterol (DUONEB) 0.5-2.5 (3) MG/3ML nebulizer solution 3 mL, 3 mL, Nebulization, Once, Cainen Burnham M, PA-C  Review of Systems  Constitutional: Negative.   HENT: Negative for congestion.   Respiratory: Negative.   Cardiovascular: Negative.   Gastrointestinal: Negative.   Musculoskeletal: Negative.   Skin: Positive for rash.  Neurological: Positive for numbness. Negative for tingling.    Social History   Tobacco Use  . Smoking status: Current Every Day Smoker  . Smokeless tobacco: Never Used  Substance Use Topics  . Alcohol use: No      Objective:   BP (!) 148/100 (BP Location: Right Arm, Patient Position: Sitting, Cuff Size: Normal)   Pulse 80   Temp 97.7 F (36.5 C) (Temporal)   Wt 236 lb 3.2 oz (107.1 kg)   SpO2 95%   BMI 34.88 kg/m  Vitals:   10/25/19 1010  BP: (!) 148/100  Pulse: 80  Temp: 97.7 F (36.5 C)  TempSrc: Temporal  SpO2: 95%  Weight: 236 lb 3.2 oz (107.1 kg)  Body mass index is 34.88 kg/m.   Physical Exam Constitutional:      Appearance: Normal appearance.  Cardiovascular:     Rate and Rhythm: Normal rate and regular rhythm.     Heart sounds: Normal heart sounds.  Pulmonary:     Effort: Pulmonary effort is normal.     Breath sounds: Normal breath sounds.  Skin:    General: Skin is warm and dry.     Findings: Rash present.       Neurological:     Mental Status: He is alert and oriented to person, place, and time. Mental status is at baseline.  Psychiatric:        Mood and Affect: Mood normal.        Behavior: Behavior normal.     Media Information   Document Information  Photos  Right leg   10/25/2019 10:12  Attached To:  Office Visit on 10/25/19 with Trey Sailors, PA-C  Source Information  Trey Sailors, PA-C  [facility]     No results found for any visits on 10/25/19.     Assessment & Plan    1. Rash  Recurrent over  the course of years in the same spot on his right ankle. Last culture did not grow a bacteria. Will treat with doxycycline. Refer to dermatology and also to vascular surgery to determine why area is not healing. Concerned for vascular disease given longstanding heavy smoking history.  - doxycycline (VIBRA-TABS) 100 MG tablet; Take 1 tablet (100 mg total) by mouth 2 (two) times daily for 7 days.  Dispense: 14 tablet; Refill: 0 - Ambulatory referral to Dermatology  2. Hypertension, unspecified type  Patient not compliant with follow up. Start metoprolol as below in addition to other  BP medications. Stressed importance of follow up. He is s/p CVA. Follow up in 6 months.   - metoprolol tartrate (LOPRESSOR) 25 MG tablet; Take 1 tablet (25 mg total) by mouth 2 (two) times daily.  Dispense: 180 tablet; Refill: 0 - atorvastatin (LIPITOR) 10 MG tablet; Take 1 tablet (10 mg total) by mouth daily.  Dispense: 90 tablet; Refill: 0 - Comprehensive Metabolic Panel (CMET) - CBC with Differential  3. Thrombus of pulmonary vein (HCC)   4. Colon cancer screening  - Cologuard  5. Hyperlipidemia, unspecified hyperlipidemia type  Uncontrolled, sent in Lipitor 10 mg QHS.   6. Leukocytosis, unspecified type  Likely due to smoking, have offered hematology referral.  7. Elevated hemoglobin (HCC)        Trey Sailors, PA-C  [facility]

## 2019-10-26 ENCOUNTER — Telehealth: Payer: Self-pay

## 2019-10-26 LAB — CBC WITH DIFFERENTIAL/PLATELET
Basophils Absolute: 0.1 10*3/uL (ref 0.0–0.2)
Basos: 1 %
EOS (ABSOLUTE): 0.4 10*3/uL (ref 0.0–0.4)
Eos: 3 %
Hematocrit: 56.5 % — ABNORMAL HIGH (ref 37.5–51.0)
Hemoglobin: 19.4 g/dL — ABNORMAL HIGH (ref 13.0–17.7)
Immature Grans (Abs): 0 10*3/uL (ref 0.0–0.1)
Immature Granulocytes: 0 %
Lymphocytes Absolute: 3.1 10*3/uL (ref 0.7–3.1)
Lymphs: 27 %
MCH: 31 pg (ref 26.6–33.0)
MCHC: 34.3 g/dL (ref 31.5–35.7)
MCV: 90 fL (ref 79–97)
Monocytes Absolute: 1 10*3/uL — ABNORMAL HIGH (ref 0.1–0.9)
Monocytes: 9 %
Neutrophils Absolute: 6.7 10*3/uL (ref 1.4–7.0)
Neutrophils: 60 %
Platelets: 263 10*3/uL (ref 150–450)
RBC: 6.26 x10E6/uL — ABNORMAL HIGH (ref 4.14–5.80)
RDW: 12.7 % (ref 11.6–15.4)
WBC: 11.2 10*3/uL — ABNORMAL HIGH (ref 3.4–10.8)

## 2019-10-26 LAB — COMPREHENSIVE METABOLIC PANEL
ALT: 29 IU/L (ref 0–44)
AST: 27 IU/L (ref 0–40)
Albumin/Globulin Ratio: 1.4 (ref 1.2–2.2)
Albumin: 4.3 g/dL (ref 3.8–4.9)
Alkaline Phosphatase: 129 IU/L — ABNORMAL HIGH (ref 39–117)
BUN/Creatinine Ratio: 10 (ref 9–20)
BUN: 11 mg/dL (ref 6–24)
Bilirubin Total: 0.9 mg/dL (ref 0.0–1.2)
CO2: 21 mmol/L (ref 20–29)
Calcium: 9.7 mg/dL (ref 8.7–10.2)
Chloride: 100 mmol/L (ref 96–106)
Creatinine, Ser: 1.08 mg/dL (ref 0.76–1.27)
GFR calc Af Amer: 90 mL/min/{1.73_m2} (ref >59)
GFR calc non Af Amer: 78 mL/min/{1.73_m2} (ref >59)
Globulin, Total: 3 g/dL (ref 1.5–4.5)
Glucose: 80 mg/dL (ref 65–99)
Potassium: 4.5 mmol/L (ref 3.5–5.2)
Sodium: 136 mmol/L (ref 134–144)
Total Protein: 7.3 g/dL (ref 6.0–8.5)

## 2019-10-26 NOTE — Telephone Encounter (Signed)
-----   Message from Trey Sailors, [place] sent at 10/26/2019 11:10 AM EDT ----- CBC shows a mildly elevated white count and also elevated hemoglobin that has been present for at least three years. This is likely due to his smoking but I am happy to refer him to hematology for further workup if he wants. His metabolic panel looks OK. He needs to take his new BP medication and cholesterol medication.   Also, I think he should also see a vascular surgeon to assess why that area on his right leg is not healing. This is in addition to the dermatologist. If he is agreeable to the referral I will place it.

## 2019-10-26 NOTE — Telephone Encounter (Signed)
This is fine 

## 2019-10-26 NOTE — Telephone Encounter (Signed)
Patient was advised and states that he will talk to his wife about going to the hematology. Please would like to know more about the hematology and what will they tell him differently then you. He states that he will wait for the vascular surgeon referral until after he visit the dermatologist. Please advise.

## 2019-10-30 ENCOUNTER — Other Ambulatory Visit: Payer: Self-pay | Admitting: Physician Assistant

## 2019-10-30 DIAGNOSIS — I10 Essential (primary) hypertension: Secondary | ICD-10-CM

## 2019-10-30 NOTE — Telephone Encounter (Signed)
Called patient to advise him that the reason to be referred to a hematology is to determine why his white blood count is elevated. Left message for patient to return call, if patient returns call ok for [facility] to advise.

## 2019-10-30 NOTE — Telephone Encounter (Signed)
Requested Prescriptions  Pending Prescriptions Disp Refills  . atorvastatin (LIPITOR) 10 MG tablet [Pharmacy Med Name: ATORVASTATIN 10 MG TABLET] 90 tablet 1    Sig: TAKE 1 TABLET BY MOUTH EVERY DAY     Cardiovascular:  Antilipid - Statins Failed - 10/30/2019  5:23 AM      Failed - Total Cholesterol in normal range and within 360 days    Cholesterol, Total  Date Value Ref Range Status  03/06/2019 224 (H) 100 - 199 mg/dL Final         Failed - LDL in normal range and within 360 days    LDL Calculated  Date Value Ref Range Status  03/06/2019 155 (H) 0 - 99 mg/dL Final         Failed - HDL in normal range and within 360 days    HDL  Date Value Ref Range Status  03/06/2019 37 (L) >39 mg/dL Final         Failed - Triglycerides in normal range and within 360 days    Triglycerides  Date Value Ref Range Status  03/06/2019 162 (H) 0 - 149 mg/dL Final         Passed - Patient is not pregnant      Passed - Valid encounter within last 12 months    Recent Outpatient Visits          5 days ago Rash   [facility] Osvaldo Angst M, PA-C   3 months ago Chronic pain of right knee   [facility] Osvaldo Angst M, PA-C   7 months ago Constipation, unspecified constipation type   [facility] [place], [place], PA-C   1 year ago COPD exacerbation [facility])   [facility] Ruf[place], [place], [place]   1 year ago COPD exacerbation [facility])   [facility] Trey Sailors, [place]      Future Appointments            In 5 months Jodi Marble, Lavella Hammock, PA-C [employer], [facility]

## 2019-10-31 NOTE — Telephone Encounter (Signed)
Call to patient- message related to patient regarding hematology and reason for referral.

## 2019-11-01 ENCOUNTER — Ambulatory Visit (INDEPENDENT_AMBULATORY_CARE_PROVIDER_SITE_OTHER): Payer: 59 | Admitting: Dermatology

## 2019-11-01 ENCOUNTER — Encounter: Payer: Self-pay | Admitting: Dermatology

## 2019-11-01 ENCOUNTER — Other Ambulatory Visit: Payer: Self-pay

## 2019-11-01 DIAGNOSIS — I8393 Asymptomatic varicose veins of bilateral lower extremities: Secondary | ICD-10-CM

## 2019-11-01 DIAGNOSIS — I8311 Varicose veins of right lower extremity with inflammation: Secondary | ICD-10-CM

## 2019-11-01 DIAGNOSIS — L0100 Impetigo, unspecified: Secondary | ICD-10-CM | POA: Diagnosis not present

## 2019-11-01 DIAGNOSIS — I872 Venous insufficiency (chronic) (peripheral): Secondary | ICD-10-CM

## 2019-11-01 MED ORDER — DOXYCYCLINE MONOHYDRATE 100 MG PO TABS: 100.0000 mg | ORAL_TABLET | Freq: Two times a day (BID) | ORAL | 0 refills | Status: DC

## 2019-11-01 MED ORDER — MUPIROCIN 2 % EX OINT: 1.0000 "application " | TOPICAL_OINTMENT | Freq: Two times a day (BID) | CUTANEOUS | 0 refills | Status: DC

## 2019-11-01 NOTE — Progress Notes (Signed)
   New Patient Visit  Subjective  Jon Yang is a 54 y.o. male who presents for the following: Rash (of right ankle x ~10 years. Bought some socks from [employer]. There was something inside the sock that bit him. Area gets inflamed and painful every year about this time.).    Objective  Well appearing patient in no apparent distress; mood and affect are within normal limits.  A focused examination was performed including right ankle. Relevant physical exam findings are noted in the Assessment and Plan.  Objective  bilateral lower legs: Varicies.  Objective  Right Lower Leg - Anterior: Edema of lower legs and ankles. 8.0 x 7.0 cm violaceous plaque above right medial ankle - tender to palpation. Violaceous patches with crusts of right pretibial.  Images          Assessment & Plan  Symptomatic varicose veins of both lower extremities bilateral lower legs  Will plan graduated compression socks on follow up and evaluation with vascular surgeon in the future.  Impetigo Right Lower Leg - Anterior  Violaceous patches with crusts of right pretibial.  mupirocin ointment (BACTROBAN) 2 % - Right Lower Leg - Anterior  doxycycline (ADOXA) 100 MG tablet - Right Lower Leg - Anterior  Venous stasis dermatitis of right lower extremity with Lipodermatosclerosis Right Lower Leg - Anterior  Stasis dermatitis with lipodermatosclerosis - will plan to start TMC 0.1% cream and graduated compression socks on follow up. May consider Ultrasound treatments in future if indicated. Plan Vascular surgery evaluation in future for vein mapping and see if pt a candidate for treatment of veins (intravascular laser ablation)  mupirocin ointment (BACTROBAN) 2 % - Right Lower Leg - Anterior  Return in about 2 weeks (around 11/15/2019).   I, Joanie Coddington, CMA, am acting as scribe for Armida Sans, MD . Documentation: I have reviewed the above documentation for accuracy and completeness,  and I agree with the above.  Armida Sans, MD

## 2019-11-15 ENCOUNTER — Ambulatory Visit: Payer: 59 | Admitting: Dermatology

## 2019-11-16 ENCOUNTER — Encounter: Payer: Self-pay | Admitting: Physician Assistant

## 2019-11-16 ENCOUNTER — Ambulatory Visit (INDEPENDENT_AMBULATORY_CARE_PROVIDER_SITE_OTHER): Payer: 59 | Admitting: Physician Assistant

## 2019-11-16 DIAGNOSIS — J441 Chronic obstructive pulmonary disease with (acute) exacerbation: Secondary | ICD-10-CM

## 2019-11-16 DIAGNOSIS — Z03818 Encounter for observation for suspected exposure to other biological agents ruled out: Secondary | ICD-10-CM | POA: Diagnosis not present

## 2019-11-16 DIAGNOSIS — Z20828 Contact with and (suspected) exposure to other viral communicable diseases: Secondary | ICD-10-CM | POA: Diagnosis not present

## 2019-11-16 DIAGNOSIS — Z20822 Contact with and (suspected) exposure to covid-19: Secondary | ICD-10-CM | POA: Diagnosis not present

## 2019-11-16 MED ORDER — PREDNISONE 10 MG PO TABS: ORAL_TABLET | ORAL | 0 refills | Status: DC

## 2019-11-16 MED ORDER — AMOXICILLIN-POT CLAVULANATE 875-125 MG PO TABS: 1.0000 | ORAL_TABLET | Freq: Two times a day (BID) | ORAL | 0 refills | Status: AC

## 2019-11-16 MED ORDER — ALBUTEROL SULFATE HFA 108 (90 BASE) MCG/ACT IN AERS: 2.0000 | INHALATION_SPRAY | Freq: Four times a day (QID) | RESPIRATORY_TRACT | 3 refills | Status: DC | PRN

## 2019-11-16 NOTE — Progress Notes (Signed)
Virtual telephone visit    Virtual Visit via Telephone Note   This visit type was conducted due to national recommendations for restrictions regarding the COVID-19 Pandemic (e.g. social distancing) in an effort to limit this patient's exposure and mitigate transmission in our community. Due to his co-morbid illnesses, this patient is at least at moderate risk for complications without adequate follow up. This format is felt to be most appropriate for this patient at this time. The patient did not have access to video technology or had technical difficulties with video requiring transitioning to audio format only (telephone). Physical exam was limited to content and character of the telephone converstion.    Patient location: Home Provider location: Office   Patient: Jon Yang   DOB: [birth year]   54 y.o. Male  MRN: [id number] Visit Date: 11/16/2019  Today's Provider: Trinna Post, PA-C  Subjective:    Tressa Busman McClurkin,acting as a scribe for Trinna Post, PA-C.,have documented all relevant documentation on the behalf of Trinna Post, PA-C,as directed by  Trinna Post, PA-C while in the presence of Trinna Post, PA-C.  Chief Complaint  Patient presents with  . URI   URI  This is a new problem. The current episode started in the past 7 days. The problem has been gradually worsening. There has been no fever. Associated symptoms include congestion, coughing, headaches, rhinorrhea, sinus pain, a sore throat and wheezing. Pertinent negatives include no diarrhea, ear pain, nausea, sneezing or vomiting. He has tried inhaler use, sleep, increased fluids, antihistamine and NSAIDs for the symptoms. The treatment provided mild relief.  Body aches and has COVID test schedule for today, 11/16/2019 @ 11:00 AM.       Medications: Outpatient Medications Prior to Visit  Medication Sig  . amLODipine (NORVASC) 10 MG tablet TAKE 1 TABLET BY MOUTH EVERY DAY  . atorvastatin  (LIPITOR) 10 MG tablet TAKE 1 TABLET BY MOUTH EVERY DAY  . cholecalciferol (VITAMIN D) 1000 units tablet Take 5,000 Units by mouth daily.  . fluticasone (FLONASE) 50 MCG/ACT nasal spray Place 1 spray into both nostrils 2 (two) times daily.  Marland Kitchen ipratropium-albuterol (DUONEB) 0.5-2.5 (3) MG/3ML SOLN Inhale 3 mLs into the lungs 4 (four) times daily as needed.  Marland Kitchen lisinopril-hydrochlorothiazide (ZESTORETIC) 20-12.5 MG tablet TAKE 1 TABLET BY MOUTH TWICE A DAY  . metoprolol tartrate (LOPRESSOR) 25 MG tablet Take 1 tablet (25 mg total) by mouth 2 (two) times daily.  . mupirocin ointment (BACTROBAN) 2 % Place 1 application into the nose 2 (two) times daily.  Marland Kitchen Spacer/Aero-Holding Chambers (OPTICHAMBER DIAMOND-SM MASK) MISC Use with inhalers.  . SUPER B COMPLEX/C PO Take 1 tablet by mouth 2 (two) times daily.  Marland Kitchen testosterone cypionate (DEPOTESTOSTERONE CYPIONATE) 200 MG/ML injection Inject into the muscle.  . TRELEGY ELLIPTA 100-62.5-25 MCG/INH AEPB INHALE 1 PUFF BY MOUTH EVERY DAY  . doxycycline (ADOXA) 100 MG tablet Take 1 tablet (100 mg total) by mouth 2 (two) times daily. (Patient not taking: Reported on 11/16/2019)  . predniSONE (DELTASONE) 10 MG tablet Take 1 tablet (10 mg total) by mouth daily with breakfast. Take 6 pills on day 1, take 5 pills on day 2 and so on until complete. (Patient not taking: Reported on 11/16/2019)   Facility-Administered Medications Prior to Visit  Medication Dose Route Frequency Provider  . ipratropium-albuterol (DUONEB) 0.5-2.5 (3) MG/3ML nebulizer solution 3 mL  3 mL Nebulization Once Trinna Post, PA-C    Review of Systems  Constitutional: Positive  for chills and fatigue. Negative for fever.  HENT: Positive for congestion, postnasal drip, rhinorrhea, sinus pressure, sinus pain and sore throat. Negative for ear pain and sneezing.   Respiratory: Positive for cough, chest tightness, shortness of breath and wheezing.   Cardiovascular: Negative for palpitations.    Gastrointestinal: Negative for diarrhea, nausea and vomiting.  Neurological: Positive for weakness and headaches. Negative for dizziness and light-headedness.        Objective:    There were no vitals taken for this visit.       Assessment & Plan:    1. Suspected COVID-19 virus infection  - symptoms and exam c/w viral URI  - no evidence of strep pharyngitis, CAP, AOM, bacterial sinusitis, or other bacterial infection - concern for possible COVID19 infection - will send for outpatient testing - discussed need to quarantine 10 days from start of symptoms and until fever-free for at least 48 hours - discussed need to quarantine household members - discussed symptomatic management, natural course, and return precautions  - albuterol (VENTOLIN HFA) 108 (90 Base) MCG/ACT inhaler; Inhale 2 puffs into the lungs every 6 (six) hours as needed for wheezing or shortness of breath.  Dispense: 8 g; Refill: 3  2. COPD exacerbation (HCC)  - albuterol (VENTOLIN HFA) 108 (90 Base) MCG/ACT inhaler; Inhale 2 puffs into the lungs every 6 (six) hours as needed for wheezing or shortness of breath.  Dispense: 8 g; Refill: 3 - predniSONE (DELTASONE) 10 MG tablet; Take 6 pills on day 1, take 5 pills on day 2, and so on until complete.  Dispense: 21 tablet; Refill: 0 - amoxicillin-clavulanate (AUGMENTIN) 875-125 MG tablet; Take 1 tablet by mouth 2 (two) times daily for 7 days.  Dispense: 14 tablet; Refill: 0    No follow-ups on file.    I discussed the assessment and treatment plan with the patient. The patient was provided an opportunity to ask questions and all were answered. The patient agreed with the plan and demonstrated an understanding of the instructions.   The patient was advised to call back or seek an in-person evaluation if the symptoms worsen or if the condition fails to improve as anticipated.  I provided 25 minutes of non-face-to-face time during this encounter.  ITrey Sailors, PA-C, have reviewed all documentation for this visit. The documentation on 11/16/19 for the exam, diagnosis, procedures, and orders are all accurate and complete.   Maryella Shivers [facility] [phone] (phone) [phone] (fax)  [facility]

## 2019-11-18 ENCOUNTER — Encounter (INDEPENDENT_AMBULATORY_CARE_PROVIDER_SITE_OTHER): Payer: Self-pay

## 2019-11-26 ENCOUNTER — Other Ambulatory Visit: Payer: Self-pay | Admitting: Physician Assistant

## 2019-11-26 DIAGNOSIS — J449 Chronic obstructive pulmonary disease, unspecified: Secondary | ICD-10-CM

## 2019-11-26 NOTE — Telephone Encounter (Signed)
Requested medication (s) are due for refill today: yes  Requested medication (s) are on the active medication list: yes  Last refill:  10/30/2019  Future visit scheduled: yes  Notes to clinic:  Medication not assigned to a protocol, review manually   Requested Prescriptions  Pending Prescriptions Disp Refills   TRELEGY ELLIPTA 100-62.5-25 MCG/INH AEPB [Pharmacy Med Name: TRELEGY ELLIPTA 100-62.5-25] 60 each 2    Sig: INHALE 1 PUFF BY MOUTH EVERY DAY      Off-Protocol Failed - 11/26/2019  1:40 AM      Failed - Medication not assigned to a protocol, review manually.      Passed - Valid encounter within last 12 months    Recent Outpatient Visits           1 week ago Suspected COVID-19 virus infection   [facility] [place], [place], [place]   1 month ago Rash   [facility] Osvaldo Angst M, [place]   3 months ago Chronic pain of right knee   [facility] Osvaldo Angst M, [place]   8 months ago Constipation, unspecified constipation type   [facility] [place], [place], PA-C   1 year ago COPD exacerbation [facility])   [facility] [place] Trey Sailors, [place]       Future Appointments             In 5 months Jodi Marble, Lavella Hammock, PA-C [facility]

## 2019-12-17 ENCOUNTER — Other Ambulatory Visit: Payer: Self-pay | Admitting: Physician Assistant

## 2019-12-17 MED ORDER — IPRATROPIUM-ALBUTEROL 0.5-2.5 (3) MG/3ML IN SOLN: 3.0000 mL | Freq: Four times a day (QID) | RESPIRATORY_TRACT | 5 refills | Status: DC | PRN

## 2019-12-17 NOTE — Telephone Encounter (Signed)
Medication: ipratropium-albuterol (DUONEB) 0.5-2.5 (3) MG/3ML SOLN [[id number]] Last appt 11/16/19  Has the patient contacted their pharmacy? Yes  (Agent: If no, request that the patient contact the pharmacy for the refill.) (Agent: If yes, when and what did the pharmacy advise?)  Preferred Pharmacy (with phone number or street name): [id number] [place], [place] - [address]  Phone:  [phone] Fax:  [phone]     Agent: Please be advised that RX refills may take up to 3 business days. We ask that you follow-up with your pharmacy.

## 2020-01-16 ENCOUNTER — Other Ambulatory Visit: Payer: Self-pay | Admitting: Physician Assistant

## 2020-01-16 DIAGNOSIS — I10 Essential (primary) hypertension: Secondary | ICD-10-CM

## 2020-01-16 NOTE — Telephone Encounter (Signed)
Requested Prescriptions  Pending Prescriptions Disp Refills   metoprolol tartrate (LOPRESSOR) 25 MG tablet [Pharmacy Med Name: METOPROLOL TARTRATE 25 MG TAB] 180 tablet 0    Sig: TAKE 1 TABLET BY MOUTH TWICE A DAY     Cardiovascular:  Beta Blockers Failed - 01/16/2020  1:22 AM      Failed - Last BP in normal range    BP Readings from Last 1 Encounters:  10/25/19 (!) 148/100         Passed - Last Heart Rate in normal range    Pulse Readings from Last 1 Encounters:  10/25/19 80         Passed - Valid encounter within last 6 months    Recent Outpatient Visits          2 months ago Suspected COVID-19 virus infection   [facility] [place], [place], [place]   2 months ago Rash   [facility] Osvaldo Angst M, [place]   5 months ago Chronic pain of right knee   [facility] Osvaldo Angst M, [place]   10 months ago Constipation, unspecified constipation type   [facility] [place], [place], PA-C   1 year ago COPD exacerbation [facility])   [facility] Trey Sailors, [place]      Future Appointments            In 3 months Jodi Marble, Lavella Hammock, PA-C [facility]

## 2020-02-10 ENCOUNTER — Other Ambulatory Visit: Payer: Self-pay

## 2020-02-10 ENCOUNTER — Emergency Department: Payer: 59

## 2020-02-10 ENCOUNTER — Observation Stay: Payer: 59

## 2020-02-10 ENCOUNTER — Encounter: Payer: Self-pay | Admitting: Emergency Medicine

## 2020-02-10 ENCOUNTER — Observation Stay (HOSPITAL_BASED_OUTPATIENT_CLINIC_OR_DEPARTMENT_OTHER)
Admit: 2020-02-10 | Discharge: 2020-02-10 | Disposition: A | Discharge status: Home / Self Care | Payer: 59 | Attending: Internal Medicine | Admitting: Internal Medicine

## 2020-02-10 ENCOUNTER — Observation Stay
Admission: EM | Admit: 2020-02-10 | Discharge: 2020-02-11 | Disposition: A | Discharge status: Home / Self Care | Payer: 59 | Attending: Internal Medicine | Admitting: Internal Medicine

## 2020-02-10 DIAGNOSIS — R531 Weakness: Secondary | ICD-10-CM | POA: Diagnosis not present

## 2020-02-10 DIAGNOSIS — J449 Chronic obstructive pulmonary disease, unspecified: Secondary | ICD-10-CM | POA: Diagnosis not present

## 2020-02-10 DIAGNOSIS — I639 Cerebral infarction, unspecified: Secondary | ICD-10-CM | POA: Diagnosis not present

## 2020-02-10 DIAGNOSIS — Z8673 Personal history of transient ischemic attack (TIA), and cerebral infarction without residual deficits: Secondary | ICD-10-CM | POA: Diagnosis not present

## 2020-02-10 DIAGNOSIS — M25511 Pain in right shoulder: Secondary | ICD-10-CM | POA: Insufficient documentation

## 2020-02-10 DIAGNOSIS — R69 Illness, unspecified: Secondary | ICD-10-CM | POA: Diagnosis not present

## 2020-02-10 DIAGNOSIS — R4701 Aphasia: Secondary | ICD-10-CM

## 2020-02-10 DIAGNOSIS — I10 Essential (primary) hypertension: Secondary | ICD-10-CM | POA: Insufficient documentation

## 2020-02-10 DIAGNOSIS — F17200 Nicotine dependence, unspecified, uncomplicated: Secondary | ICD-10-CM | POA: Insufficient documentation

## 2020-02-10 DIAGNOSIS — R778 Other specified abnormalities of plasma proteins: Secondary | ICD-10-CM | POA: Insufficient documentation

## 2020-02-10 DIAGNOSIS — Z79899 Other long term (current) drug therapy: Secondary | ICD-10-CM | POA: Diagnosis not present

## 2020-02-10 DIAGNOSIS — R200 Anesthesia of skin: Secondary | ICD-10-CM | POA: Diagnosis not present

## 2020-02-10 DIAGNOSIS — G8191 Hemiplegia, unspecified affecting right dominant side: Secondary | ICD-10-CM | POA: Diagnosis not present

## 2020-02-10 DIAGNOSIS — J321 Chronic frontal sinusitis: Secondary | ICD-10-CM | POA: Diagnosis not present

## 2020-02-10 DIAGNOSIS — E785 Hyperlipidemia, unspecified: Secondary | ICD-10-CM | POA: Diagnosis not present

## 2020-02-10 DIAGNOSIS — G8929 Other chronic pain: Secondary | ICD-10-CM | POA: Diagnosis not present

## 2020-02-10 DIAGNOSIS — R4781 Slurred speech: Secondary | ICD-10-CM | POA: Diagnosis not present

## 2020-02-10 DIAGNOSIS — Z20822 Contact with and (suspected) exposure to covid-19: Secondary | ICD-10-CM | POA: Diagnosis not present

## 2020-02-10 DIAGNOSIS — Z720 Tobacco use: Secondary | ICD-10-CM | POA: Diagnosis not present

## 2020-02-10 DIAGNOSIS — J441 Chronic obstructive pulmonary disease with (acute) exacerbation: Secondary | ICD-10-CM

## 2020-02-10 DIAGNOSIS — I6389 Other cerebral infarction: Secondary | ICD-10-CM

## 2020-02-10 LAB — COMPREHENSIVE METABOLIC PANEL
ALT: 28 U/L (ref 0–44)
AST: 28 U/L (ref 15–41)
Albumin: 4.1 g/dL (ref 3.5–5.0)
Alkaline Phosphatase: 90 U/L (ref 38–126)
Anion gap: 8 (ref 5–15)
BUN: 11 mg/dL (ref 6–20)
CO2: 22 mmol/L (ref 22–32)
Calcium: 9 mg/dL (ref 8.9–10.3)
Chloride: 107 mmol/L (ref 98–111)
Creatinine, Ser: 0.97 mg/dL (ref 0.61–1.24)
GFR calc Af Amer: >60 mL/min (ref >60)
GFR calc non Af Amer: >60 mL/min (ref >60)
Glucose, Bld: 108 mg/dL — ABNORMAL HIGH (ref 70–99)
Potassium: 3.9 mmol/L (ref 3.5–5.1)
Sodium: 137 mmol/L (ref 135–145)
Total Bilirubin: 1.7 mg/dL — ABNORMAL HIGH (ref 0.3–1.2)
Total Protein: 7.8 g/dL (ref 6.5–8.1)

## 2020-02-10 LAB — PROTIME-INR
INR: 1 (ref 0.8–1.2)
Prothrombin Time: 12.5 seconds (ref 11.4–15.2)

## 2020-02-10 LAB — CBC
HCT: 53.1 % — ABNORMAL HIGH (ref 39.0–52.0)
Hemoglobin: 18.9 g/dL — ABNORMAL HIGH (ref 13.0–17.0)
MCH: 31.8 pg (ref 26.0–34.0)
MCHC: 35.6 g/dL (ref 30.0–36.0)
MCV: 89.2 fL (ref 80.0–100.0)
Platelets: 232 10*3/uL (ref 150–400)
RBC: 5.95 MIL/uL — ABNORMAL HIGH (ref 4.22–5.81)
RDW: 12.5 % (ref 11.5–15.5)
WBC: 10.2 10*3/uL (ref 4.0–10.5)
nRBC: 0 % (ref 0.0–0.2)

## 2020-02-10 LAB — DIFFERENTIAL
Abs Immature Granulocytes: 0.05 10*3/uL (ref 0.00–0.07)
Basophils Absolute: 0.1 10*3/uL (ref 0.0–0.1)
Basophils Relative: 1 %
Eosinophils Absolute: 0.2 10*3/uL (ref 0.0–0.5)
Eosinophils Relative: 2 %
Immature Granulocytes: 1 %
Lymphocytes Relative: 28 %
Lymphs Abs: 2.9 10*3/uL (ref 0.7–4.0)
Monocytes Absolute: 1 10*3/uL (ref 0.1–1.0)
Monocytes Relative: 10 %
Neutro Abs: 6 10*3/uL (ref 1.7–7.7)
Neutrophils Relative %: 58 %

## 2020-02-10 LAB — ECHOCARDIOGRAM COMPLETE
Height: 70 in
S' Lateral: 2.84 cm
Weight: 3760 oz

## 2020-02-10 LAB — APTT: aPTT: 31 seconds (ref 24–36)

## 2020-02-10 LAB — GLUCOSE, CAPILLARY: Glucose-Capillary: 107 mg/dL — ABNORMAL HIGH (ref 70–99)

## 2020-02-10 LAB — SARS CORONAVIRUS 2 BY RT PCR (HOSPITAL ORDER, PERFORMED IN ~~LOC~~ HOSPITAL LAB): SARS Coronavirus 2: NEGATIVE

## 2020-02-10 MED ORDER — FLUTICASONE PROPIONATE 50 MCG/ACT NA SUSP
1.0000 | Freq: Two times a day (BID) | NASAL | Status: DC
  Administered 2020-02-10 – 2020-02-11 (×3): 1 via NASAL
  Filled 2020-02-10 (×2): qty 16

## 2020-02-10 MED ORDER — ALBUTEROL SULFATE HFA 108 (90 BASE) MCG/ACT IN AERS: 2.0000 | INHALATION_SPRAY | RESPIRATORY_TRACT | Status: DC | PRN

## 2020-02-10 MED ORDER — ASPIRIN EC 81 MG PO TBEC: 81.0000 mg | DELAYED_RELEASE_TABLET | Freq: Every day | ORAL | Status: DC

## 2020-02-10 MED ORDER — BUDESONIDE 0.25 MG/2ML IN SUSP
0.2500 mg | Freq: Two times a day (BID) | RESPIRATORY_TRACT | Status: DC
  Administered 2020-02-10 – 2020-02-11 (×2): 0.25 mg via RESPIRATORY_TRACT
  Filled 2020-02-10 (×2): qty 2

## 2020-02-10 MED ORDER — IPRATROPIUM-ALBUTEROL 0.5-2.5 (3) MG/3ML IN SOLN: 3.0000 mL | Freq: Two times a day (BID) | RESPIRATORY_TRACT | Status: DC

## 2020-02-10 MED ORDER — FLUTICASONE-UMECLIDIN-VILANT 100-62.5-25 MCG/INH IN AEPB: 1.0000 | INHALATION_SPRAY | Freq: Every day | RESPIRATORY_TRACT | Status: DC

## 2020-02-10 MED ORDER — SENNOSIDES-DOCUSATE SODIUM 8.6-50 MG PO TABS: 1.0000 | ORAL_TABLET | Freq: Every evening | ORAL | Status: DC | PRN

## 2020-02-10 MED ORDER — IPRATROPIUM-ALBUTEROL 0.5-2.5 (3) MG/3ML IN SOLN
3.0000 mL | Freq: Two times a day (BID) | RESPIRATORY_TRACT | Status: DC
  Administered 2020-02-10 – 2020-02-11 (×2): 3 mL via RESPIRATORY_TRACT
  Filled 2020-02-10 (×2): qty 3

## 2020-02-10 MED ORDER — NICOTINE 21 MG/24HR TD PT24
21.0000 mg | MEDICATED_PATCH | Freq: Every day | TRANSDERMAL | Status: DC
  Administered 2020-02-10 – 2020-02-11 (×2): 21 mg via TRANSDERMAL
  Filled 2020-02-10 (×2): qty 1

## 2020-02-10 MED ORDER — ONDANSETRON HCL 4 MG/2ML IJ SOLN: 4.0000 mg | Freq: Three times a day (TID) | INTRAMUSCULAR | Status: DC | PRN

## 2020-02-10 MED ORDER — ACETAMINOPHEN 650 MG RE SUPP: 650.0000 mg | RECTAL | Status: DC | PRN

## 2020-02-10 MED ORDER — HYDRALAZINE HCL 20 MG/ML IJ SOLN
5.0000 mg | INTRAMUSCULAR | Status: DC | PRN
  Filled 2020-02-10: qty 1

## 2020-02-10 MED ORDER — HYDRALAZINE HCL 20 MG/ML IJ SOLN: 5.0000 mg | INTRAMUSCULAR | Status: DC | PRN

## 2020-02-10 MED ORDER — ENOXAPARIN SODIUM 40 MG/0.4ML ~~LOC~~ SOLN
40.0000 mg | SUBCUTANEOUS | Status: DC
  Administered 2020-02-10: 40 mg via SUBCUTANEOUS
  Filled 2020-02-10: qty 0.4

## 2020-02-10 MED ORDER — STROKE: EARLY STAGES OF RECOVERY BOOK: Freq: Once | Status: AC

## 2020-02-10 MED ORDER — ACETAMINOPHEN 160 MG/5ML PO SOLN
650.0000 mg | ORAL | Status: DC | PRN
  Filled 2020-02-10: qty 20.3

## 2020-02-10 MED ORDER — ALBUTEROL SULFATE (2.5 MG/3ML) 0.083% IN NEBU
2.5000 mg | INHALATION_SOLUTION | RESPIRATORY_TRACT | Status: DC | PRN
  Administered 2020-02-10: 2.5 mg via RESPIRATORY_TRACT
  Filled 2020-02-10: qty 3

## 2020-02-10 MED ORDER — SODIUM CHLORIDE 0.9% FLUSH: 3.0000 mL | Freq: Once | INTRAVENOUS | Status: DC

## 2020-02-10 MED ORDER — ATORVASTATIN CALCIUM 20 MG PO TABS
10.0000 mg | ORAL_TABLET | Freq: Every day | ORAL | Status: DC
  Administered 2020-02-10 – 2020-02-11 (×2): 10 mg via ORAL
  Filled 2020-02-10 (×2): qty 1

## 2020-02-10 MED ORDER — ASPIRIN EC 81 MG PO TBEC
81.0000 mg | DELAYED_RELEASE_TABLET | Freq: Every day | ORAL | Status: DC
  Administered 2020-02-10 – 2020-02-11 (×2): 81 mg via ORAL
  Filled 2020-02-10 (×2): qty 1

## 2020-02-10 MED ORDER — IOHEXOL 350 MG/ML SOLN
100.0000 mL | Freq: Once | INTRAVENOUS | Status: AC | PRN
  Administered 2020-02-10: 100 mL via INTRAVENOUS

## 2020-02-10 MED ORDER — ACETAMINOPHEN 325 MG PO TABS: 650.0000 mg | ORAL_TABLET | ORAL | Status: DC | PRN

## 2020-02-10 NOTE — ED Notes (Signed)
CT notified of IV placement for contrast.

## 2020-02-10 NOTE — Consult Note (Signed)
Requesting Physician:  Dr. Fuller Plan    Chief Complaint: R sided weakness    HPI: Jon Yang is an 54 y.o. male with hx of HTN, smoker, hx of prior hemorrhagic stroke in 2017 with R sided weakness that has resolved and was without any dficits. LKW last night between 9-10PM . Pt woke up at 4:30 with severe dysarthria, R facial droop, RUE weakness and dragging RLE.  Symptoms improved.   Date last known well: Date: 02/09/2020 Time last known well: Time: 09:00 tPA Given: No: outside window  Past Medical History:  Diagnosis Date  . Hypertension   . Stroke [facility]) 2017    Past Surgical History:  Procedure Laterality Date  . NO PAST SURGERIES      Family History  Problem Relation Age of Onset  . Healthy Mother   . Lung cancer Father   . Prostate cancer Father   . Healthy Sister   . Hypertension Brother   . Healthy Sister    Social History:  reports that he has been smoking. He has never used smokeless tobacco. He reports that he does not drink alcohol and does not use drugs.  Allergies:  Allergies  Allergen Reactions  . Shellfish Allergy     Other reaction(s): Other (See Comments) Feels drunk Lightheaded    Medications: I have reviewed the patient's current medications.  ROS: History obtained from the patient  General ROS: negative for - chills, fatigue, fever, night sweats, weight gain or weight loss Psychological ROS: negative for - behavioral disorder, hallucinations, memory difficulties, mood swings or suicidal ideation Ophthalmic ROS: negative for - blurry vision, double vision, eye pain or loss of vision ENT ROS: negative for - epistaxis, nasal discharge, oral lesions, sore throat, tinnitus or vertigo Allergy and Immunology ROS: negative for - hives or itchy/watery eyes Hematological and Lymphatic ROS: negative for - bleeding problems, bruising or swollen lymph nodes Endocrine ROS: negative for - galactorrhea, hair pattern changes, polydipsia/polyuria or temperature  intolerance Respiratory ROS: negative for - cough, hemoptysis, shortness of breath or wheezing Cardiovascular ROS: negative for - chest pain, dyspnea on exertion, edema or irregular heartbeat Gastrointestinal ROS: negative for - abdominal pain, diarrhea, hematemesis, nausea/vomiting or stool incontinence Genito-Urinary ROS: negative for - dysuria, hematuria, incontinence or urinary frequency/urgency Musculoskeletal ROS: negative for - joint swelling or muscular weakness Neurological ROS: as noted in HPI Dermatological ROS: negative for rash and skin lesion changes  Physical Examination: Blood pressure (!) 159/112, pulse 65, temperature 97.9 F (36.6 C), temperature source Oral, resp. rate 17, height 5\' 10"  (1.778 m), weight 106.6 kg, SpO2 95 %.    Neurological Examination   Mental Status: Alert, oriented. Mild dysarthria present  Cranial Nerves: II: Discs flat bilaterally; Visual fields grossly normal, pupils equal, round, reactive to light and accommodation III,IV, VI: ptosis not present, extra-ocular motions intact bilaterally V,VII: mild R facial droop  VIII: hearing normal bilaterally IX,X: gag reflex present XI: bilateral shoulder shrug XII: midline tongue extension Motor: Right : Upper extremity   4/5    Left:     Upper extremity   5/5  Lower extremity   4/5     Lower extremity   5/5 Tone and bulk:normal tone throughout; no atrophy noted Sensory: Pinprick and light touch intact throughout, bilaterally Deep Tendon Reflexes: 2+ and symmetric throughout Plantars: Right: downgoing   Left: downgoing Cerebellar: normal finger-to-nose      Laboratory Studies:  Basic Metabolic Panel: Recent Labs  Lab 02/10/20 1007  NA 137  K  3.9  CL 107  CO2 22  GLUCOSE 108*  BUN 11  CREATININE 0.97  CALCIUM 9.0    Liver Function Tests: Recent Labs  Lab 02/10/20 1007  AST 28  ALT 28  ALKPHOS 90  BILITOT 1.7*  PROT 7.8  ALBUMIN 4.1   No results for input(s): LIPASE,  AMYLASE in the last 168 hours. No results for input(s): AMMONIA in the last 168 hours.  CBC: Recent Labs  Lab 02/10/20 1007  WBC 10.2  NEUTROABS 6.0  HGB 18.9*  HCT 53.1*  MCV 89.2  PLT 232    Cardiac Enzymes: No results for input(s): CKTOTAL, CKMB, CKMBINDEX, TROPONINI in the last 168 hours.  BNP: Invalid input(s): POCBNP  CBG: Recent Labs  Lab 02/10/20 1007  GLUCAP 107*    Microbiology: Results for orders placed or performed in visit on 06/06/19  Novel Coronavirus, NAA ([facility])     Status: None   Collection Time: 06/06/19 12:00 AM   Specimen: Nasopharyngeal(NP) swabs in vial transport medium   NASOPHARYNGE  TESTING  Result Value Ref Range Status   SARS-CoV-2, NAA Not Detected Not Detected Final    Comment: This nucleic acid amplification test was developed and its performance characteristics determined by [employer]. Nucleic acid amplification tests include PCR and TMA. This test has not been FDA cleared or approved. This test has been authorized by FDA under an Emergency Use Authorization (EUA). This test is only authorized for the duration of time the declaration that circumstances exist justifying the authorization of the emergency use of in vitro diagnostic tests for detection of SARS-CoV-2 virus and/or diagnosis of COVID-19 infection under section 564(b)(1) of the Act, 21 U.S.C. 740CXK-4(Y) (1), unless the authorization is terminated or revoked sooner. When diagnostic testing is negative, the possibility of a false negative result should be considered in the context of a patient's recent exposures and the presence of clinical signs and symptoms consistent with COVID-19. An individual without symptoms of COVID-19 and who is not shedding SARS-CoV-2 virus would  expect to have a negative (not detected) result in this assay.     Coagulation Studies: Recent Labs    02/10/20 1007  LABPROT 12.5  INR 1.0    Urinalysis: No results for input(s):  COLORURINE, LABSPEC, PHURINE, GLUCOSEU, HGBUR, BILIRUBINUR, KETONESUR, PROTEINUR, UROBILINOGEN, NITRITE, LEUKOCYTESUR in the last 168 hours.  Invalid input(s): APPERANCEUR  Lipid Panel:    Component Value Date/Time   CHOL 224 (H) 03/06/2019 0855   TRIG 162 (H) 03/06/2019 0855   HDL 37 (L) 03/06/2019 0855   CHOLHDL 6.1 (H) 03/06/2019 0855   LDLCALC 155 (H) 03/06/2019 0855    HgbA1C: No results found for: HGBA1C  Urine Drug Screen:  No results found for: LABOPIA, COCAINSCRNUR, LABBENZ, AMPHETMU, THCU, LABBARB  Alcohol Level: No results for input(s): ETH in the last 168 hours.  Other results: EKG: normal EKG, normal sinus rhythm, unchanged from previous tracings.  Imaging: CT Angio Head W or Wo Contrast  Result Date: 02/10/2020 CLINICAL DATA:  Right-sided weakness and numbness.  Slurred speech. EXAM: CT ANGIOGRAPHY HEAD AND NECK CT PERFUSION BRAIN TECHNIQUE: Multidetector CT imaging of the head and neck was performed using the standard protocol during bolus administration of intravenous contrast. Multiplanar CT image reconstructions and MIPs were obtained to evaluate the vascular anatomy. Carotid stenosis measurements (when applicable) are obtained utilizing NASCET criteria, using the distal internal carotid diameter as the denominator. Multiphase CT imaging of the brain was performed following IV bolus contrast injection. Subsequent parametric  perfusion maps were calculated using RAPID software. CONTRAST:  OMNIPAQUE IOHEXOL 350 MG/ML SOLN COMPARISON:  CT head 02/10/2020 FINDINGS: CTA NECK FINDINGS Aortic arch: Standard branching. Imaged portion shows no evidence of aneurysm or dissection. No significant stenosis of the major arch vessel origins. Right carotid system: Right carotid widely patent without stenosis. Left carotid system: Left carotid widely patent without stenosis. Vertebral arteries: Both vertebral arteries widely patent to the basilar without stenosis. Skeleton: No acute  skeletal abnormality. Other neck: Negative for mass or adenopathy. Upper chest: Lung apices clear bilaterally. Review of the MIP images confirms the above findings CTA HEAD FINDINGS Anterior circulation: Cavernous carotid patent bilaterally without stenosis. Anterior and middle cerebral arteries patent bilaterally without stenosis or large vessel occlusion. Posterior circulation: Both vertebral arteries patent to the basilar without stenosis. PICA patent bilaterally. AICA patent bilaterally. Basilar widely patent. Superior cerebellar and posterior cerebral arteries patent bilaterally without stenosis. Venous sinuses: Normal venous enhancement. Anatomic variants: None Review of the MIP images confirms the above findings CT Brain Perfusion Findings: ASPECTS: 10 CBF (<30%) Volume: 31mL Perfusion (Tmax>6.0s) volume: 25mL Mismatch Volume: 58mL Infarction Location:None IMPRESSION: 1. CT perfusion negative for acute infarct or ischemia. 2. No carotid or vertebral artery stenosis on in the neck. 3. Negative for intracranial stenosis or large vessel occlusion. Electronically Signed   By: Marlan Palau M.D.   On: 02/10/2020 12:07   CT HEAD WO CONTRAST  Result Date: 02/10/2020 CLINICAL DATA:  Pt to ED via POV stating that he woke up this morning with at 0300 with slurred speech and right sided numbness and weakness. Pt states Pt states that he has hx/o brain bleed in 2017 that affected the left side. Pt unable to hold.*comment was truncated* EXAM: CT HEAD WITHOUT CONTRAST TECHNIQUE: Contiguous axial images were obtained from the base of the skull through the vertex without intravenous contrast. COMPARISON:  CT head 03/27/2017 FINDINGS: Brain: No evidence of acute infarction, hemorrhage, hydrocephalus, extra-axial collection or mass lesion/mass effect. Vascular: No hyperdense vessel or unexpected calcification. Skull: Normal. Negative for fracture or focal lesion. Sinuses/Orbits: Partial opacification of the right frontal  sinus. Otherwise sinuses are clear. Normal appearance of the orbits. Other: None. IMPRESSION: 1. No acute intracranial findings. 2. Right frontal sinus disease. Electronically Signed   By: Emmaline Kluver M.D.   On: 02/10/2020 11:02   CT Angio Neck W and/or Wo Contrast  Result Date: 02/10/2020 CLINICAL DATA:  Right-sided weakness and numbness.  Slurred speech. EXAM: CT ANGIOGRAPHY HEAD AND NECK CT PERFUSION BRAIN TECHNIQUE: Multidetector CT imaging of the head and neck was performed using the standard protocol during bolus administration of intravenous contrast. Multiplanar CT image reconstructions and MIPs were obtained to evaluate the vascular anatomy. Carotid stenosis measurements (when applicable) are obtained utilizing NASCET criteria, using the distal internal carotid diameter as the denominator. Multiphase CT imaging of the brain was performed following IV bolus contrast injection. Subsequent parametric perfusion maps were calculated using RAPID software. CONTRAST:  OMNIPAQUE IOHEXOL 350 MG/ML SOLN COMPARISON:  CT head 02/10/2020 FINDINGS: CTA NECK FINDINGS Aortic arch: Standard branching. Imaged portion shows no evidence of aneurysm or dissection. No significant stenosis of the major arch vessel origins. Right carotid system: Right carotid widely patent without stenosis. Left carotid system: Left carotid widely patent without stenosis. Vertebral arteries: Both vertebral arteries widely patent to the basilar without stenosis. Skeleton: No acute skeletal abnormality. Other neck: Negative for mass or adenopathy. Upper chest: Lung apices clear bilaterally. Review of the MIP images  confirms the above findings CTA HEAD FINDINGS Anterior circulation: Cavernous carotid patent bilaterally without stenosis. Anterior and middle cerebral arteries patent bilaterally without stenosis or large vessel occlusion. Posterior circulation: Both vertebral arteries patent to the basilar without stenosis. PICA patent  bilaterally. AICA patent bilaterally. Basilar widely patent. Superior cerebellar and posterior cerebral arteries patent bilaterally without stenosis. Venous sinuses: Normal venous enhancement. Anatomic variants: None Review of the MIP images confirms the above findings CT Brain Perfusion Findings: ASPECTS: 10 CBF (<30%) Volume: 0mL Perfusion (Tmax>6.0s) volume: 0mL Mismatch Volume: 0mL Infarction Location:None IMPRESSION: 1. CT perfusion negative for acute infarct or ischemia. 2. No carotid or vertebral artery stenosis on in the neck. 3. Negative for intracranial stenosis or large vessel occlusion. Electronically Signed   By: Marlan Palau M.D.   On: 02/10/2020 12:07   CT CEREBRAL PERFUSION W CONTRAST  Result Date: 02/10/2020 CLINICAL DATA:  Right-sided weakness and numbness.  Slurred speech. EXAM: CT ANGIOGRAPHY HEAD AND NECK CT PERFUSION BRAIN TECHNIQUE: Multidetector CT imaging of the head and neck was performed using the standard protocol during bolus administration of intravenous contrast. Multiplanar CT image reconstructions and MIPs were obtained to evaluate the vascular anatomy. Carotid stenosis measurements (when applicable) are obtained utilizing NASCET criteria, using the distal internal carotid diameter as the denominator. Multiphase CT imaging of the brain was performed following IV bolus contrast injection. Subsequent parametric perfusion maps were calculated using RAPID software. CONTRAST:  OMNIPAQUE IOHEXOL 350 MG/ML SOLN COMPARISON:  CT head 02/10/2020 FINDINGS: CTA NECK FINDINGS Aortic arch: Standard branching. Imaged portion shows no evidence of aneurysm or dissection. No significant stenosis of the major arch vessel origins. Right carotid system: Right carotid widely patent without stenosis. Left carotid system: Left carotid widely patent without stenosis. Vertebral arteries: Both vertebral arteries widely patent to the basilar without stenosis. Skeleton: No acute skeletal abnormality.  Other neck: Negative for mass or adenopathy. Upper chest: Lung apices clear bilaterally. Review of the MIP images confirms the above findings CTA HEAD FINDINGS Anterior circulation: Cavernous carotid patent bilaterally without stenosis. Anterior and middle cerebral arteries patent bilaterally without stenosis or large vessel occlusion. Posterior circulation: Both vertebral arteries patent to the basilar without stenosis. PICA patent bilaterally. AICA patent bilaterally. Basilar widely patent. Superior cerebellar and posterior cerebral arteries patent bilaterally without stenosis. Venous sinuses: Normal venous enhancement. Anatomic variants: None Review of the MIP images confirms the above findings CT Brain Perfusion Findings: ASPECTS: 10 CBF (<30%) Volume: 0mL Perfusion (Tmax>6.0s) volume: 0mL Mismatch Volume: 0mL Infarction Location:None IMPRESSION: 1. CT perfusion negative for acute infarct or ischemia. 2. No carotid or vertebral artery stenosis on in the neck. 3. Negative for intracranial stenosis or large vessel occlusion. Electronically Signed   By: Marlan Palau M.D.   On: 02/10/2020 12:07    Assessment: 54 y.o. male  with hx of HTN, smoker, hx of prior hemorrhagic stroke in 2017 with R sided weakness that has resolved and was without any dficits. LKW last night between 9-10PM . Pt woke up at 4:30 with severe dysarthria, R facial droop, RUE weakness and dragging RLE.  Symptoms improved.   Stroke Risk Factors - hyperlipidemia, hypertension and smoking  Plan: 1. HgbA1c, fasting lipid panel 2. MRI brain.  CTA h/n pending  3. PT consult, OT consult, Speech consult 4. Echocardiogram 5. Carotid dopplers 6. Prophylactic therapy-Antiplatelet med: Aspirin - dose . Don't see any prior ASA use 7. NPO until RN stroke swallow screen 8. Telemetry monitoring 9. Frequent neuro checks  02/10/2020, 12:24 PM

## 2020-02-10 NOTE — H&P (Signed)
History and Physical    Jon Yang VQM:[id number] DOB: [birth year] DOA: 02/10/2020  Referring MD/NP/PA:   PCP: Trey Sailors, PA-C   Patient coming from:  The patient is coming from home.  At baseline, pt is independent for most of ADL.        Chief Complaint: Difficulty speaking, right-sided weakness, right facial droop  HPI: Jon Yang is a 54 y.o. male with medical history significant of hemorrhagic stroke 2017, hyperlipidemia, hypertension, COPD, tobacco abuse, who presents with difficulty speaking, right-sided weakness.  Patient states that he had hemorrhagic stroke 2017 with left-sided weakness, which has largely resolved.  He was LKW last night between 9-10 PM per his wife. Pt woke up at about 4:30 with severe dysarthria, R facial droop, RUE weakness and dragging RLE.  His symptoms have been improving.  No hearing loss or vision change. Patient has mild dry cough due to COPD, which has not changed.  Denies chest pain, shortness of breath, fever or chills.  No nausea vomiting, diarrhea, abdominal pain, symptoms of UTI.  ED Course: pt was found to have WBC 10.2, INR 1.0, PTT 31, pending COVID-19 PCR, electrolytes renal function okay, temperature normal, blood pressure 159/112, heart rate 65, RR 23, oxygen saturation 95% on room air.  CT of head is negative for acute intracranial abnormalities. Pt is placed on MedSurg for observation.  Dr. Loretha Brasil of neurology is consulted  CTA of head, neck and brain perfusion: 1. CT perfusion negative for acute infarct or ischemia. 2. No carotid or vertebral artery stenosis on in the neck. 3. Negative for intracranial stenosis or large vessel occlusion.    Review of Systems:   General: no fevers, chills, no body weight gain, has fatigue HEENT: no blurry vision, hearing changes or sore throat Respiratory: no dyspnea, has coughing, no wheezing CV: no chest pain, no palpitations GI: no nausea, vomiting, abdominal pain, diarrhea,  constipation GU: no dysuria, burning on urination, increased urinary frequency, hematuria  Ext: no leg edema Neuro: no vision change or hearing loss.  Has difficulty speaking, right-sided weakness and right facial droop Skin: no rash, no skin tear. MSK: No muscle spasm, no deformity, no limitation of range of movement in spin Heme: No easy bruising.  Travel history: No recent long distant travel.  Allergy:  Allergies  Allergen Reactions  . Shellfish Allergy     Other reaction(s): Other (See Comments) Feels drunk Lightheaded    Past Medical History:  Diagnosis Date  . Hypertension   . Stroke [facility]) 2017    Past Surgical History:  Procedure Laterality Date  . NO PAST SURGERIES      Social History:  reports that he has been smoking. He has never used smokeless tobacco. He reports that he does not drink alcohol and does not use drugs.  Family History:  Family History  Problem Relation Age of Onset  . Healthy Mother   . Lung cancer Father   . Prostate cancer Father   . Healthy Sister   . Hypertension Brother   . Healthy Sister      Prior to Admission medications   Medication Sig Start Date End Date Taking? Authorizing Provider  albuterol (VENTOLIN HFA) 108 (90 Base) MCG/ACT inhaler Inhale 2 puffs into the lungs every 6 (six) hours as needed for wheezing or shortness of breath. 11/16/19   Trey Sailors, PA-C  amLODipine (NORVASC) 10 MG tablet TAKE 1 TABLET BY MOUTH EVERY DAY Patient taking differently: Take 10 mg by mouth daily.  08/25/18   Trey SailorsPollak, Adriana M, PA-C  atorvastatin (LIPITOR) 10 MG tablet TAKE 1 TABLET BY MOUTH EVERY DAY Patient taking differently: Take 10 mg by mouth daily.  10/30/19   Trey SailorsPollak, Adriana M, PA-C  cholecalciferol (VITAMIN D) 1000 units tablet Take 5,000 Units by mouth daily.    [provider]  fluticasone (FLONASE) 50 MCG/ACT nasal spray Place 1 spray into both nostrils 2 (two) times daily. 01/24/17   [provider]   ipratropium-albuterol (DUONEB) 0.5-2.5 (3) MG/3ML SOLN Inhale 3 mLs into the lungs 4 (four) times daily as needed. 12/17/19   Trey SailorsPollak, Adriana M, PA-C  lisinopril-hydrochlorothiazide (ZESTORETIC) 20-12.5 MG tablet TAKE 1 TABLET BY MOUTH TWICE A DAY 04/23/19   Osvaldo AngstPollak, Adriana M, PA-C  metoprolol tartrate (LOPRESSOR) 25 MG tablet TAKE 1 TABLET BY MOUTH TWICE A DAY Patient taking differently: Take 25 mg by mouth 2 (two) times daily.  01/16/20   Trey SailorsPollak, Adriana M, PA-C  mupirocin ointment (BACTROBAN) 2 % Place 1 application into the nose 2 (two) times daily. Patient not taking: Reported on 02/10/2020 11/01/19   Deirdre EvenerKowalski, David C, MD  predniSONE (DELTASONE) 10 MG tablet Take 6 pills on day 1, take 5 pills on day 2, and so on until complete. Patient not taking: Reported on 02/10/2020 11/16/19   Trey SailorsPollak, Adriana M, PA-C  Spacer/Aero-Holding Chambers (OPTICHAMBER DIAMOND-SM MASK) MISC Use with inhalers. 08/11/17   [provider]  SUPER B COMPLEX/C PO Take 1 tablet by mouth 2 (two) times daily.    [provider]  testosterone cypionate (DEPOTESTOSTERONE CYPIONATE) 200 MG/ML injection Inject into the muscle.    [provider]  Dwyane LuoRELEGY ELLIPTA 100-62.5-25 MCG/INH AEPB INHALE 1 PUFF BY MOUTH EVERY DAY 11/26/19   Trey SailorsPollak, Adriana M, PA-C    Physical Exam: Vitals:   02/10/20 1102 02/10/20 1130 02/10/20 1305 02/10/20 1355  BP:  (!) 159/112 (!) 173/91   Pulse:  65 63 81  Resp: 19 17 19    Temp:   98 F (36.7 C)   TempSrc:   Oral   SpO2:  95% 98%   Weight:      Height:       General: Not in acute distress HEENT:       Eyes: PERRL, EOMI, no scleral icterus.       ENT: No discharge from the ears and nose, no pharynx injection, no tonsillar enlargement.        Neck: No JVD, no bruit, no mass felt. Heme: No neck lymph node enlargement. Cardiac: S1/S2, RRR, No murmurs, No gallops or rubs. Respiratory: No rales, wheezing, rhonchi or rubs. GI: Soft, nondistended, nontender, no rebound  pain, no organomegaly, BS present. GU: No hematuria Ext: No pitting leg edema bilaterally. 2+DP/PT pulse bilaterally. Musculoskeletal: No joint deformities, No joint redness or warmth, no limitation of ROM in spin. Skin: No rashes.  Neuro: Alert, oriented X3, cranial nerves II-XII grossly intact except for mild right facial droop.  Muscle strength minimally decreased in the right arm and leg. Sensation to light touch intact. Brachial reflex 2+ bilaterally. Psych: Patient is not psychotic, no suicidal or hemocidal ideation.  Labs on Admission: I have personally reviewed following labs and imaging studies  CBC: Recent Labs  Lab 02/10/20 1007  WBC 10.2  NEUTROABS 6.0  HGB 18.9*  HCT 53.1*  MCV 89.2  PLT 232   Basic Metabolic Panel: Recent Labs  Lab 02/10/20 1007  NA 137  K 3.9  CL 107  CO2 22  GLUCOSE 108*  BUN 11  CREATININE 0.97  CALCIUM 9.0   GFR: Estimated Creatinine Clearance: 107.6 mL/min (by C-G formula based on SCr of 0.97 mg/dL). Liver Function Tests: Recent Labs  Lab 02/10/20 1007  AST 28  ALT 28  ALKPHOS 90  BILITOT 1.7*  PROT 7.8  ALBUMIN 4.1   No results for input(s): LIPASE, AMYLASE in the last 168 hours. No results for input(s): AMMONIA in the last 168 hours. Coagulation Profile: Recent Labs  Lab 02/10/20 1007  INR 1.0   Cardiac Enzymes: No results for input(s): CKTOTAL, CKMB, CKMBINDEX, TROPONINI in the last 168 hours. BNP (last 3 results) No results for input(s): PROBNP in the last 8760 hours. HbA1C: No results for input(s): HGBA1C in the last 72 hours. CBG: Recent Labs  Lab 02/10/20 1007  GLUCAP 107*   Lipid Profile: No results for input(s): CHOL, HDL, LDLCALC, TRIG, CHOLHDL, LDLDIRECT in the last 72 hours. Thyroid Function Tests: No results for input(s): TSH, T4TOTAL, FREET4, T3FREE, THYROIDAB in the last 72 hours. Anemia Panel: No results for input(s): VITAMINB12, FOLATE, FERRITIN, TIBC, IRON, RETICCTPCT in the last 72  hours. Urine analysis:    Component Value Date/Time   BILIRUBINUR Negative 05/08/2019 1437   PROTEINUR Negative 05/08/2019 1437   UROBILINOGEN 0.2 05/08/2019 1437   NITRITE Negative 05/08/2019 1437   LEUKOCYTESUR Negative 05/08/2019 1437   Sepsis Labs: (procalcitonin:4,lacticidven:4) ) Recent Results (from the past 240 hour(s))  SARS Coronavirus 2 by RT PCR (hospital order, performed in [facility] hospital lab) Nasopharyngeal Nasopharyngeal Swab     Status: None   Collection Time: 02/10/20 12:48 PM   Specimen: Nasopharyngeal Swab  Result Value Ref Range Status   SARS Coronavirus 2 NEGATIVE NEGATIVE Final    Comment: (NOTE) SARS-CoV-2 target nucleic acids are NOT DETECTED.  The SARS-CoV-2 RNA is generally detectable in upper and lower respiratory specimens during the acute phase of infection. The lowest concentration of SARS-CoV-2 viral copies this assay can detect is 250 copies / mL. A negative result does not preclude SARS-CoV-2 infection and should not be used as the sole basis for treatment or other patient management decisions.  A negative result may occur with improper specimen collection / handling, submission of specimen other than nasopharyngeal swab, presence of viral mutation(s) within the areas targeted by this assay, and inadequate number of viral copies (<250 copies / mL). A negative result must be combined with clinical observations, patient history, and epidemiological information.  Fact Sheet for Patients:   BoilerBrush.com.cy  Fact Sheet for Healthcare Providers: https://pope.com/  This test is not yet approved or  cleared by the [place] FDA and has been authorized for detection and/or diagnosis of SARS-CoV-2 by FDA under an Emergency Use Authorization (EUA).  This EUA will remain in effect (meaning this test can be used) for the duration of the COVID-19 declaration under Section 564(b)(1) of  the Act, 21 U.S.C. section 360bbb-3(b)(1), unless the authorization is terminated or revoked sooner.  Performed at [facility], [address] [place], [place] [address]      Radiological Exams on Admission: CT Angio Head W or Wo Contrast  Result Date: 02/10/2020 CLINICAL DATA:  Right-sided weakness and numbness.  Slurred speech. EXAM: CT ANGIOGRAPHY HEAD AND NECK CT PERFUSION BRAIN TECHNIQUE: Multidetector CT imaging of the head and neck was performed using the standard protocol during bolus administration of intravenous contrast. Multiplanar CT image reconstructions and MIPs were obtained to evaluate the vascular anatomy. Carotid stenosis measurements (when applicable) are obtained utilizing NASCET  criteria, using the distal internal carotid diameter as the denominator. Multiphase CT imaging of the brain was performed following IV bolus contrast injection. Subsequent parametric perfusion maps were calculated using RAPID software. CONTRAST:  OMNIPAQUE IOHEXOL 350 MG/ML SOLN COMPARISON:  CT head 02/10/2020 FINDINGS: CTA NECK FINDINGS Aortic arch: Standard branching. Imaged portion shows no evidence of aneurysm or dissection. No significant stenosis of the major arch vessel origins. Right carotid system: Right carotid widely patent without stenosis. Left carotid system: Left carotid widely patent without stenosis. Vertebral arteries: Both vertebral arteries widely patent to the basilar without stenosis. Skeleton: No acute skeletal abnormality. Other neck: Negative for mass or adenopathy. Upper chest: Lung apices clear bilaterally. Review of the MIP images confirms the above findings CTA HEAD FINDINGS Anterior circulation: Cavernous carotid patent bilaterally without stenosis. Anterior and middle cerebral arteries patent bilaterally without stenosis or large vessel occlusion. Posterior circulation: Both vertebral arteries patent to the basilar without stenosis. PICA patent bilaterally.  AICA patent bilaterally. Basilar widely patent. Superior cerebellar and posterior cerebral arteries patent bilaterally without stenosis. Venous sinuses: Normal venous enhancement. Anatomic variants: None Review of the MIP images confirms the above findings CT Brain Perfusion Findings: ASPECTS: 10 CBF (<30%) Volume: 68mL Perfusion (Tmax>6.0s) volume: 84mL Mismatch Volume: 42mL Infarction Location:None IMPRESSION: 1. CT perfusion negative for acute infarct or ischemia. 2. No carotid or vertebral artery stenosis on in the neck. 3. Negative for intracranial stenosis or large vessel occlusion. Electronically Signed   By: Marlan Palau M.D.   On: 02/10/2020 12:07   CT HEAD WO CONTRAST  Result Date: 02/10/2020 CLINICAL DATA:  Pt to ED via POV stating that he woke up this morning with at 0300 with slurred speech and right sided numbness and weakness. Pt states Pt states that he has hx/o brain bleed in 2017 that affected the left side. Pt unable to hold.*comment was truncated* EXAM: CT HEAD WITHOUT CONTRAST TECHNIQUE: Contiguous axial images were obtained from the base of the skull through the vertex without intravenous contrast. COMPARISON:  CT head 03/27/2017 FINDINGS: Brain: No evidence of acute infarction, hemorrhage, hydrocephalus, extra-axial collection or mass lesion/mass effect. Vascular: No hyperdense vessel or unexpected calcification. Skull: Normal. Negative for fracture or focal lesion. Sinuses/Orbits: Partial opacification of the right frontal sinus. Otherwise sinuses are clear. Normal appearance of the orbits. Other: None. IMPRESSION: 1. No acute intracranial findings. 2. Right frontal sinus disease. Electronically Signed   By: Emmaline Kluver M.D.   On: 02/10/2020 11:02   CT Angio Neck W and/or Wo Contrast  Result Date: 02/10/2020 CLINICAL DATA:  Right-sided weakness and numbness.  Slurred speech. EXAM: CT ANGIOGRAPHY HEAD AND NECK CT PERFUSION BRAIN TECHNIQUE: Multidetector CT imaging of the head and  neck was performed using the standard protocol during bolus administration of intravenous contrast. Multiplanar CT image reconstructions and MIPs were obtained to evaluate the vascular anatomy. Carotid stenosis measurements (when applicable) are obtained utilizing NASCET criteria, using the distal internal carotid diameter as the denominator. Multiphase CT imaging of the brain was performed following IV bolus contrast injection. Subsequent parametric perfusion maps were calculated using RAPID software. CONTRAST:  OMNIPAQUE IOHEXOL 350 MG/ML SOLN COMPARISON:  CT head 02/10/2020 FINDINGS: CTA NECK FINDINGS Aortic arch: Standard branching. Imaged portion shows no evidence of aneurysm or dissection. No significant stenosis of the major arch vessel origins. Right carotid system: Right carotid widely patent without stenosis. Left carotid system: Left carotid widely patent without stenosis. Vertebral arteries: Both vertebral arteries widely patent to the basilar  without stenosis. Skeleton: No acute skeletal abnormality. Other neck: Negative for mass or adenopathy. Upper chest: Lung apices clear bilaterally. Review of the MIP images confirms the above findings CTA HEAD FINDINGS Anterior circulation: Cavernous carotid patent bilaterally without stenosis. Anterior and middle cerebral arteries patent bilaterally without stenosis or large vessel occlusion. Posterior circulation: Both vertebral arteries patent to the basilar without stenosis. PICA patent bilaterally. AICA patent bilaterally. Basilar widely patent. Superior cerebellar and posterior cerebral arteries patent bilaterally without stenosis. Venous sinuses: Normal venous enhancement. Anatomic variants: None Review of the MIP images confirms the above findings CT Brain Perfusion Findings: ASPECTS: 10 CBF (<30%) Volume: 0mL Perfusion (Tmax>6.0s) volume: 0mL Mismatch Volume: 0mL Infarction Location:None IMPRESSION: 1. CT perfusion negative for acute infarct or  ischemia. 2. No carotid or vertebral artery stenosis on in the neck. 3. Negative for intracranial stenosis or large vessel occlusion. Electronically Signed   By: Marlan Palau M.D.   On: 02/10/2020 12:07   CT CEREBRAL PERFUSION W CONTRAST  Result Date: 02/10/2020 CLINICAL DATA:  Right-sided weakness and numbness.  Slurred speech. EXAM: CT ANGIOGRAPHY HEAD AND NECK CT PERFUSION BRAIN TECHNIQUE: Multidetector CT imaging of the head and neck was performed using the standard protocol during bolus administration of intravenous contrast. Multiplanar CT image reconstructions and MIPs were obtained to evaluate the vascular anatomy. Carotid stenosis measurements (when applicable) are obtained utilizing NASCET criteria, using the distal internal carotid diameter as the denominator. Multiphase CT imaging of the brain was performed following IV bolus contrast injection. Subsequent parametric perfusion maps were calculated using RAPID software. CONTRAST:  OMNIPAQUE IOHEXOL 350 MG/ML SOLN COMPARISON:  CT head 02/10/2020 FINDINGS: CTA NECK FINDINGS Aortic arch: Standard branching. Imaged portion shows no evidence of aneurysm or dissection. No significant stenosis of the major arch vessel origins. Right carotid system: Right carotid widely patent without stenosis. Left carotid system: Left carotid widely patent without stenosis. Vertebral arteries: Both vertebral arteries widely patent to the basilar without stenosis. Skeleton: No acute skeletal abnormality. Other neck: Negative for mass or adenopathy. Upper chest: Lung apices clear bilaterally. Review of the MIP images confirms the above findings CTA HEAD FINDINGS Anterior circulation: Cavernous carotid patent bilaterally without stenosis. Anterior and middle cerebral arteries patent bilaterally without stenosis or large vessel occlusion. Posterior circulation: Both vertebral arteries patent to the basilar without stenosis. PICA patent bilaterally. AICA patent  bilaterally. Basilar widely patent. Superior cerebellar and posterior cerebral arteries patent bilaterally without stenosis. Venous sinuses: Normal venous enhancement. Anatomic variants: None Review of the MIP images confirms the above findings CT Brain Perfusion Findings: ASPECTS: 10 CBF (<30%) Volume: 0mL Perfusion (Tmax>6.0s) volume: 0mL Mismatch Volume: 0mL Infarction Location:None IMPRESSION: 1. CT perfusion negative for acute infarct or ischemia. 2. No carotid or vertebral artery stenosis on in the neck. 3. Negative for intracranial stenosis or large vessel occlusion. Electronically Signed   By: Marlan Palau M.D.   On: 02/10/2020 12:07     EKG: Independently reviewed.  Sinus rhythm, QTC 456, no ischemic change  Assessment/Plan Principal Problem:   Stroke [facility]) Active Problems:   Hypertension   COPD (chronic obstructive pulmonary disease) (HCC)   Tobacco abuse   Hyperlipidemia   Possible Stroke [facility]): Patient symptoms are concerning for possible stroke.  CT head is negative for acute intracranial abnormalities. CT of brain perfusion negative for acute infarct or ischemia; CTA of neck and head showed no carotid or vertebral artery stenosis on in the neck, negative intracranial stenosis or large vessel occlusion.  Dr. Loretha Brasil  of neurology is consulted, who recommended getting MRI for brain and start patient with aspirin.   -Placed on MedSurg bed for observation - Highly appreciated neurologist's consultation,will follow up recommendations - Obtain MRI - will not do carotid dopplers since pt had CTA of head and neck - ASA  81 mg daily - continue lipitor - fasting lipid panel and HbA1c  - 2D transthoracic echocardiography  - swallowing screen. If fails, will get SLP - PT/OT consult  Hypertension:  -prn hydralazine for SBP>220 or dBP>120 -Hold home Zestoretic, metoprolol, amlodipine to allow permissive hypertension  COPD (chronic obstructive pulmonary disease) (HCC):  stable -Bronchodilators  Tobacco abuse -Nicotine patch  Hyperlipidemia -Lipitor     DVT ppx: SQ Lovenox Code Status: Full code Family Communication:    Yes, patient's wife at bed side Disposition Plan:  Anticipate discharge back to previous environment Consults called: Neurology, Dr. Loretha Brasil  admission status: Med-surg bed for obs     Status is: Observation  The patient remains OBS appropriate and will d/c before 2 midnights.  Dispo: The patient is from: Home              Anticipated d/c is to: Home              Anticipated d/c date is: 1 day              Patient currently is not medically stable to d/c.            Date of Service 02/10/2020    Lorretta Harp [facility]   If 7PM-7AM, please contact night-coverage [email or web address] 02/10/2020, 2:09 PM

## 2020-02-10 NOTE — ED Provider Notes (Signed)
[facility] Emergency Department Provider Note  ____________________________________________   First MD Initiated Contact with Patient 02/10/20 1051     (approximate)  I have reviewed the triage vital signs and the nursing notes.   HISTORY  Chief Complaint Numbness and Aphasia    HPI Jon Yang is a 54 y.o. male with hypertension, stroke who comes in with difficulty with speaking.  Patient reports waking up this morning around 3 AM with slurred speech and right-sided numbness and weakness.  Patient is a history of a brain bleed 2017 that affected the left side.  Denies any residual deficits.  LLK was 1045 PM 7/17.  Patient states that he woke up with his right arm with feeling more weak and having some sensation changes.  He also reports a little bit of weakness in his right leg but seems to have gotten a little bit better.  He reported little bit of difficulty with walking due to the right leg.  His wife felt like his speech was off.  His symptoms in his arm he states seems to be a little bit worse, constant, nothing makes it better, nothing makes it worse.  He denies any chest pain.  Does report some chronic right shoulder pain but denies any new injuries.          Past Medical History:  Diagnosis Date  . Hypertension   . Stroke [facility](HCC) 2017    Patient Active Problem List   Diagnosis Date Noted  . Hyperlipidemia 10/25/2019  . History of hemorrhagic stroke with residual hemiparesis (HCC) 03/06/2019  . Thrombus of pulmonary vein (HCC) 11/21/2017  . Migraines 11/03/2017  . Chest pain 10/21/2017  . Headache 10/21/2017  . Bronchitis 08/09/2017  . Hypertension 11/10/2015  . ICH (intracerebral hemorrhage) (HCC) 11/10/2015  . Nontraumatic subcortical hemorrhage of right cerebral hemisphere (HCC) 11/10/2015  . Tobacco abuse 11/10/2015    Past Surgical History:  Procedure Laterality Date  . NO PAST SURGERIES      Prior to Admission medications     Medication Sig Start Date End Date Taking? Authorizing Provider  albuterol (VENTOLIN HFA) 108 (90 Base) MCG/ACT inhaler Inhale 2 puffs into the lungs every 6 (six) hours as needed for wheezing or shortness of breath. 11/16/19   Trey SailorsPollak, Adriana M, PA-C  amLODipine (NORVASC) 10 MG tablet TAKE 1 TABLET BY MOUTH EVERY DAY 08/25/18   Trey SailorsPollak, Adriana M, PA-C  atorvastatin (LIPITOR) 10 MG tablet TAKE 1 TABLET BY MOUTH EVERY DAY 10/30/19   Trey SailorsPollak, Adriana M, PA-C  cholecalciferol (VITAMIN D) 1000 units tablet Take 5,000 Units by mouth daily.    [provider]  fluticasone (FLONASE) 50 MCG/ACT nasal spray Place 1 spray into both nostrils 2 (two) times daily. 01/24/17   [provider]  ipratropium-albuterol (DUONEB) 0.5-2.5 (3) MG/3ML SOLN Inhale 3 mLs into the lungs 4 (four) times daily as needed. 12/17/19   Trey SailorsPollak, Adriana M, PA-C  lisinopril-hydrochlorothiazide (ZESTORETIC) 20-12.5 MG tablet TAKE 1 TABLET BY MOUTH TWICE A DAY 04/23/19   Osvaldo AngstPollak, Adriana M, PA-C  metoprolol tartrate (LOPRESSOR) 25 MG tablet TAKE 1 TABLET BY MOUTH TWICE A DAY 01/16/20   Osvaldo AngstPollak, Adriana M, PA-C  mupirocin ointment (BACTROBAN) 2 % Place 1 application into the nose 2 (two) times daily. 11/01/19   Deirdre EvenerKowalski, David C, MD  predniSONE (DELTASONE) 10 MG tablet Take 6 pills on day 1, take 5 pills on day 2, and so on until complete. 11/16/19   Trey SailorsPollak, Adriana M, PA-C  Spacer/Aero-Holding  Chambers (OPTICHAMBER DIAMOND-SM MASK) MISC Use with inhalers. 08/11/17   [provider]  SUPER B COMPLEX/C PO Take 1 tablet by mouth 2 (two) times daily.    [provider]  testosterone cypionate (DEPOTESTOSTERONE CYPIONATE) 200 MG/ML injection Inject into the muscle.    [provider]  Dwyane Luo 100-62.5-25 MCG/INH AEPB INHALE 1 PUFF BY MOUTH EVERY DAY 11/26/19   Trey Sailors, PA-C    Allergies Shellfish allergy  Family History  Problem Relation Age of Onset  . Healthy Mother   . Lung cancer Father    . Prostate cancer Father   . Healthy Sister   . Hypertension Brother   . Healthy Sister     Social History Social History   Tobacco Use  . Smoking status: Current Every Day Smoker  . Smokeless tobacco: Never Used  Vaping Use  . Vaping Use: Never used  Substance Use Topics  . Alcohol use: No  . Drug use: No      Review of Systems Constitutional: No fever/chills Eyes: No visual changes. ENT: No sore throat. Cardiovascular: Denies chest pain. Respiratory: Denies shortness of breath. Gastrointestinal: No abdominal pain.  No nausea, no vomiting.  No diarrhea.  No constipation. Genitourinary: Negative for dysuria. Musculoskeletal: Negative for back pain. Skin: Negative for rash. Neurological: Negative for headaches, focal weakness or numbness. All other ROS negative ____________________________________________   PHYSICAL EXAM:  VITAL SIGNS: ED Triage Vitals  Enc Vitals Group     BP 02/10/20 1006 (!) 173/115     Pulse Rate 02/10/20 1002 77     Resp 02/10/20 1002 16     Temp 02/10/20 1002 97.9 F (36.6 C)     Temp Source 02/10/20 1002 Oral     SpO2 02/10/20 1002 95 %     Weight 02/10/20 1004 235 lb (106.6 kg)     Height 02/10/20 1004 5\' 10"  (1.778 m)     Head Circumference --      Peak Flow --      Pain Score 02/10/20 1003 0     Pain Loc --      Pain Edu? --      Excl. in GC? --     Constitutional: Alert and oriented. Well appearing and in no acute distress. Eyes: Conjunctivae are normal. EOMI. Head: Atraumatic. Nose: No congestion/rhinnorhea. Mouth/Throat: Mucous membranes are moist.   Neck: No stridor. Trachea Midline. FROM Cardiovascular: Normal rate, regular rhythm. Grossly normal heart sounds.  Good peripheral circulation. Respiratory: Normal respiratory effort.  No retractions. Lungs CTAB. Gastrointestinal: Soft and nontender. No distention. No abdominal bruits.  Musculoskeletal: No lower extremity tenderness nor edema.  No joint  effusions. Neurologic: Slight aphasia with some pronator drift in the right arm.  Good strength in the right leg without any sensation changes in the right leg. Skin:  Skin is warm, dry and intact. No rash noted. Psychiatric: Mood and affect are normal. Speech and behavior are normal. GU: Deferred   ____________________________________________   LABS (all labs ordered are listed, but only abnormal results are displayed)  Labs Reviewed  CBC - Abnormal; Notable for the following components:      Result Value   RBC 5.95 (*)    Hemoglobin 18.9 (*)    HCT 53.1 (*)    All other components within normal limits  COMPREHENSIVE METABOLIC PANEL - Abnormal; Notable for the following components:   Glucose, Bld 108 (*)    Total Bilirubin 1.7 (*)    All other  components within normal limits  GLUCOSE, CAPILLARY - Abnormal; Notable for the following components:   Glucose-Capillary 107 (*)    All other components within normal limits  SARS CORONAVIRUS 2 BY RT PCR (HOSPITAL ORDER, PERFORMED IN [facility])  PROTIME-INR  APTT  DIFFERENTIAL  CBG MONITORING, ED   ____________________________________________   ED ECG REPORT I, Concha Se, the attending physician, personally viewed and interpreted this ECG.  Normal sinus rate of 78, no ST ovation, T wave inversion lead III, normal intervals ____________________________________________  RADIOLOGY  Official radiology report(s): CT Angio Head W or Wo Contrast  Result Date: 02/10/2020 CLINICAL DATA:  Right-sided weakness and numbness.  Slurred speech. EXAM: CT ANGIOGRAPHY HEAD AND NECK CT PERFUSION BRAIN TECHNIQUE: Multidetector CT imaging of the head and neck was performed using the standard protocol during bolus administration of intravenous contrast. Multiplanar CT image reconstructions and MIPs were obtained to evaluate the vascular anatomy. Carotid stenosis measurements (when applicable) are obtained utilizing NASCET criteria,  using the distal internal carotid diameter as the denominator. Multiphase CT imaging of the brain was performed following IV bolus contrast injection. Subsequent parametric perfusion maps were calculated using RAPID software. CONTRAST:  OMNIPAQUE IOHEXOL 350 MG/ML SOLN COMPARISON:  CT head 02/10/2020 FINDINGS: CTA NECK FINDINGS Aortic arch: Standard branching. Imaged portion shows no evidence of aneurysm or dissection. No significant stenosis of the major arch vessel origins. Right carotid system: Right carotid widely patent without stenosis. Left carotid system: Left carotid widely patent without stenosis. Vertebral arteries: Both vertebral arteries widely patent to the basilar without stenosis. Skeleton: No acute skeletal abnormality. Other neck: Negative for mass or adenopathy. Upper chest: Lung apices clear bilaterally. Review of the MIP images confirms the above findings CTA HEAD FINDINGS Anterior circulation: Cavernous carotid patent bilaterally without stenosis. Anterior and middle cerebral arteries patent bilaterally without stenosis or large vessel occlusion. Posterior circulation: Both vertebral arteries patent to the basilar without stenosis. PICA patent bilaterally. AICA patent bilaterally. Basilar widely patent. Superior cerebellar and posterior cerebral arteries patent bilaterally without stenosis. Venous sinuses: Normal venous enhancement. Anatomic variants: None Review of the MIP images confirms the above findings CT Brain Perfusion Findings: ASPECTS: 10 CBF (<30%) Volume: 84mL Perfusion (Tmax>6.0s) volume: 36mL Mismatch Volume: 63mL Infarction Location:None IMPRESSION: 1. CT perfusion negative for acute infarct or ischemia. 2. No carotid or vertebral artery stenosis on in the neck. 3. Negative for intracranial stenosis or large vessel occlusion. Electronically Signed   By: Marlan Palau M.D.   On: 02/10/2020 12:07   CT HEAD WO CONTRAST  Result Date: 02/10/2020 CLINICAL DATA:  Pt to ED via  POV stating that he woke up this morning with at 0300 with slurred speech and right sided numbness and weakness. Pt states Pt states that he has hx/o brain bleed in 2017 that affected the left side. Pt unable to hold.*comment was truncated* EXAM: CT HEAD WITHOUT CONTRAST TECHNIQUE: Contiguous axial images were obtained from the base of the skull through the vertex without intravenous contrast. COMPARISON:  CT head 03/27/2017 FINDINGS: Brain: No evidence of acute infarction, hemorrhage, hydrocephalus, extra-axial collection or mass lesion/mass effect. Vascular: No hyperdense vessel or unexpected calcification. Skull: Normal. Negative for fracture or focal lesion. Sinuses/Orbits: Partial opacification of the right frontal sinus. Otherwise sinuses are clear. Normal appearance of the orbits. Other: None. IMPRESSION: 1. No acute intracranial findings. 2. Right frontal sinus disease. Electronically Signed   By: Emmaline Kluver M.D.   On: 02/10/2020 11:02   CT Angio  Neck W and/or Wo Contrast  Result Date: 02/10/2020 CLINICAL DATA:  Right-sided weakness and numbness.  Slurred speech. EXAM: CT ANGIOGRAPHY HEAD AND NECK CT PERFUSION BRAIN TECHNIQUE: Multidetector CT imaging of the head and neck was performed using the standard protocol during bolus administration of intravenous contrast. Multiplanar CT image reconstructions and MIPs were obtained to evaluate the vascular anatomy. Carotid stenosis measurements (when applicable) are obtained utilizing NASCET criteria, using the distal internal carotid diameter as the denominator. Multiphase CT imaging of the brain was performed following IV bolus contrast injection. Subsequent parametric perfusion maps were calculated using RAPID software. CONTRAST:  OMNIPAQUE IOHEXOL 350 MG/ML SOLN COMPARISON:  CT head 02/10/2020 FINDINGS: CTA NECK FINDINGS Aortic arch: Standard branching. Imaged portion shows no evidence of aneurysm or dissection. No significant stenosis of the  major arch vessel origins. Right carotid system: Right carotid widely patent without stenosis. Left carotid system: Left carotid widely patent without stenosis. Vertebral arteries: Both vertebral arteries widely patent to the basilar without stenosis. Skeleton: No acute skeletal abnormality. Other neck: Negative for mass or adenopathy. Upper chest: Lung apices clear bilaterally. Review of the MIP images confirms the above findings CTA HEAD FINDINGS Anterior circulation: Cavernous carotid patent bilaterally without stenosis. Anterior and middle cerebral arteries patent bilaterally without stenosis or large vessel occlusion. Posterior circulation: Both vertebral arteries patent to the basilar without stenosis. PICA patent bilaterally. AICA patent bilaterally. Basilar widely patent. Superior cerebellar and posterior cerebral arteries patent bilaterally without stenosis. Venous sinuses: Normal venous enhancement. Anatomic variants: None Review of the MIP images confirms the above findings CT Brain Perfusion Findings: ASPECTS: 10 CBF (<30%) Volume: 37mL Perfusion (Tmax>6.0s) volume: 15mL Mismatch Volume: 38mL Infarction Location:None IMPRESSION: 1. CT perfusion negative for acute infarct or ischemia. 2. No carotid or vertebral artery stenosis on in the neck. 3. Negative for intracranial stenosis or large vessel occlusion. Electronically Signed   By: Marlan Palau M.D.   On: 02/10/2020 12:07   CT CEREBRAL PERFUSION W CONTRAST  Result Date: 02/10/2020 CLINICAL DATA:  Right-sided weakness and numbness.  Slurred speech. EXAM: CT ANGIOGRAPHY HEAD AND NECK CT PERFUSION BRAIN TECHNIQUE: Multidetector CT imaging of the head and neck was performed using the standard protocol during bolus administration of intravenous contrast. Multiplanar CT image reconstructions and MIPs were obtained to evaluate the vascular anatomy. Carotid stenosis measurements (when applicable) are obtained utilizing NASCET criteria, using the distal  internal carotid diameter as the denominator. Multiphase CT imaging of the brain was performed following IV bolus contrast injection. Subsequent parametric perfusion maps were calculated using RAPID software. CONTRAST:  OMNIPAQUE IOHEXOL 350 MG/ML SOLN COMPARISON:  CT head 02/10/2020 FINDINGS: CTA NECK FINDINGS Aortic arch: Standard branching. Imaged portion shows no evidence of aneurysm or dissection. No significant stenosis of the major arch vessel origins. Right carotid system: Right carotid widely patent without stenosis. Left carotid system: Left carotid widely patent without stenosis. Vertebral arteries: Both vertebral arteries widely patent to the basilar without stenosis. Skeleton: No acute skeletal abnormality. Other neck: Negative for mass or adenopathy. Upper chest: Lung apices clear bilaterally. Review of the MIP images confirms the above findings CTA HEAD FINDINGS Anterior circulation: Cavernous carotid patent bilaterally without stenosis. Anterior and middle cerebral arteries patent bilaterally without stenosis or large vessel occlusion. Posterior circulation: Both vertebral arteries patent to the basilar without stenosis. PICA patent bilaterally. AICA patent bilaterally. Basilar widely patent. Superior cerebellar and posterior cerebral arteries patent bilaterally without stenosis. Venous sinuses: Normal venous enhancement. Anatomic variants: None Review of  the MIP images confirms the above findings CT Brain Perfusion Findings: ASPECTS: 10 CBF (<30%) Volume: 0mL Perfusion (Tmax>6.0s) volume: 0mL Mismatch Volume: 0mL Infarction Location:None IMPRESSION: 1. CT perfusion negative for acute infarct or ischemia. 2. No carotid or vertebral artery stenosis on in the neck. 3. Negative for intracranial stenosis or large vessel occlusion. Electronically Signed   By: Marlan Palau M.D.   On: 02/10/2020 12:07    ____________________________________________   PROCEDURES  Procedure(s) performed  (including Critical Care):  Procedures   ____________________________________________   INITIAL IMPRESSION / ASSESSMENT AND PLAN / ED COURSE  Arvel Oquinn was evaluated in Emergency Department on 02/10/2020 for the symptoms described in the history of present illness. He was evaluated in the context of the global COVID-19 pandemic, which necessitated consideration that the patient might be at risk for infection with the SARS-CoV-2 virus that causes COVID-19. Institutional protocols and algorithms that pertain to the evaluation of patients at risk for COVID-19 are in a state of rapid change based on information released by regulatory bodies including the CDC and federal and state organizations. These policies and algorithms were followed during the patient's care in the ED.    Patient symptoms have been going on for over 12 hours.  He is not a stroke code candidate.  He is not a candidate for TPA at this point.  However his symptoms are concerning for stroke.  Will get CTA to evaluate for LVO but his symptoms already seem to be resolving.  Will get labs to evaluate for Electra abnormalities, AKI, repeat CT head evaluate for intracranial hemorrhage.  Denies any new chest pain to suggest ACS/dissection.  Reports some chronic right shoulder pain but I explained to him that that would not explain the weakness that he had in his right leg.  Will discuss with neurology  CT scans are reassuring  11:35 AM d/w Dr. Loretha Brasil he will come see patient.  Recommend admission for stroke work-up        ____________________________________________   FINAL CLINICAL IMPRESSION(S) / ED DIAGNOSES   Final diagnoses:  Aphasia  Right sided weakness      MEDICATIONS GIVEN DURING THIS VISIT:  Medications  sodium chloride flush (NS) 0.9 % injection 3 mL (3 mLs Intravenous Not Given 02/10/20 1051)  nicotine (NICODERM CQ - dosed in mg/24 hours) patch 21 mg (has no administration in time range)  ondansetron  (ZOFRAN) injection 4 mg (has no administration in time range)  albuterol (PROVENTIL) (2.5 MG/3ML) 0.083% nebulizer solution 2.5 mg (has no administration in time range)  iohexol (OMNIPAQUE) 350 MG/ML injection 100 mL (100 mLs Intravenous Contrast Given 02/10/20 1146)     ED Discharge Orders    None       Note:  This document was prepared using Dragon voice recognition software and may include unintentional dictation errors.   Concha Se, MD 02/10/20 1308

## 2020-02-10 NOTE — Progress Notes (Signed)
*  PRELIMINARY RESULTS* Echocardiogram 2D Echocardiogram has been performed.  Jon Yang 02/10/2020, 2:58 PM

## 2020-02-10 NOTE — ED Triage Notes (Signed)
Pt to ED via POV stating that he woke up this morning with at 0300 with slurred speech and right sided numbness and weakness. Pt states Pt states that he has hx/o brain bleed in 2017 that affected the left side. Pt unable to hold right arm up against gravity. Pt is able to hold right leg up with some drift. Pt is in NAD at this time.

## 2020-02-11 DIAGNOSIS — I639 Cerebral infarction, unspecified: Secondary | ICD-10-CM

## 2020-02-11 DIAGNOSIS — J449 Chronic obstructive pulmonary disease, unspecified: Secondary | ICD-10-CM | POA: Diagnosis not present

## 2020-02-11 DIAGNOSIS — Z720 Tobacco use: Secondary | ICD-10-CM | POA: Diagnosis not present

## 2020-02-11 DIAGNOSIS — R4701 Aphasia: Secondary | ICD-10-CM

## 2020-02-11 DIAGNOSIS — I10 Essential (primary) hypertension: Secondary | ICD-10-CM | POA: Diagnosis not present

## 2020-02-11 LAB — HIV ANTIBODY (ROUTINE TESTING W REFLEX): HIV Screen 4th Generation wRfx: NONREACTIVE

## 2020-02-11 LAB — LIPID PANEL
Cholesterol: 233 mg/dL — ABNORMAL HIGH (ref 0–200)
HDL: 33 mg/dL — ABNORMAL LOW (ref >40)
LDL Cholesterol: 166 mg/dL — ABNORMAL HIGH (ref 0–99)
Total CHOL/HDL Ratio: 7.1 RATIO
Triglycerides: 169 mg/dL — ABNORMAL HIGH (ref <150)
VLDL: 34 mg/dL (ref 0–40)

## 2020-02-11 LAB — TROPONIN I (HIGH SENSITIVITY)
Troponin I (High Sensitivity): 299 ng/L (ref <18)
Troponin I (High Sensitivity): 275 ng/L (ref <18)

## 2020-02-11 LAB — HEMOGLOBIN A1C
Hgb A1c MFr Bld: 5.4 % (ref 4.8–5.6)
Mean Plasma Glucose: 108.28 mg/dL

## 2020-02-11 MED ORDER — CARVEDILOL 3.125 MG PO TABS: 12.5000 mg | ORAL_TABLET | Freq: Two times a day (BID) | ORAL | Status: DC

## 2020-02-11 MED ORDER — CARVEDILOL 12.5 MG PO TABS: 12.5000 mg | ORAL_TABLET | Freq: Two times a day (BID) | ORAL | 0 refills | Status: DC

## 2020-02-11 MED ORDER — SACUBITRIL-VALSARTAN 24-26 MG PO TABS: 1.0000 | ORAL_TABLET | Freq: Two times a day (BID) | ORAL | Status: DC

## 2020-02-11 MED ORDER — SPIRONOLACTONE 25 MG PO TABS: 12.5000 mg | ORAL_TABLET | Freq: Every day | ORAL | Status: DC

## 2020-02-11 MED ORDER — NICOTINE 21 MG/24HR TD PT24: 21.0000 mg | MEDICATED_PATCH | Freq: Every day | TRANSDERMAL | 0 refills | Status: DC

## 2020-02-11 MED ORDER — ATORVASTATIN CALCIUM 40 MG PO TABS
40.0000 mg | ORAL_TABLET | Freq: Every day | ORAL | 11 refills | Status: DC
Start: 2020-02-11 — End: 2021-03-11

## 2020-02-11 MED ORDER — SACUBITRIL-VALSARTAN 24-26 MG PO TABS: 1.0000 | ORAL_TABLET | Freq: Two times a day (BID) | ORAL | 0 refills | Status: DC

## 2020-02-11 MED ORDER — ASPIRIN 81 MG PO TBEC: 81.0000 mg | DELAYED_RELEASE_TABLET | Freq: Every day | ORAL | 11 refills | Status: AC

## 2020-02-11 MED ORDER — SPIRONOLACTONE 25 MG PO TABS: 12.5000 mg | ORAL_TABLET | Freq: Every day | ORAL | 0 refills | Status: DC

## 2020-02-11 NOTE — Evaluation (Signed)
Physical Therapy Evaluation Patient Details Name: Jon Yang MRN: [id number] DOB: 1965/09/19 Today's Date: 02/11/2020   History of Present Illness  Jon Yang is a 54 y.o. male with medical history significant of hemorrhagic stroke 2017, hyperlipidemia, hypertension, COPD, tobacco abuse, who presents with difficulty speaking, right-sided weakness. W/u included: CT perfusion negative for acute infarct or ischemia, MRI w/o contrast + for small L pons infarct.  Clinical Impression  Pt is a pleasant 54 year old male who was admitted for positive CVA in [place]. Pt performs bed mobility/transfers with mod I and ambulation with supervision and no AD. Pt with normal coordination/sensation. Pt reports he feels "close to normal" Pt demonstrates all bed mobility/transfers/ambulation at baseline level. Pt does not require any further PT needs at this time. Pt will be dc in house and does not require follow up. RN aware. Will dc current orders.     Follow Up Recommendations No PT follow up    Equipment Recommendations  None recommended by PT    Recommendations for Other Services       Precautions / Restrictions Precautions Precautions: Fall Restrictions Weight Bearing Restrictions: No      Mobility  Bed Mobility Overal bed mobility: Modified Independent             General bed mobility comments: safe technique performed.  Transfers Overall transfer level: Modified independent Equipment used: None             General transfer comment: braces against bed upon initially standing, however is able to self correct. Pt reports this is his norm when he first gets up  Ambulation/Gait Ambulation/Gait assistance: Supervision Gait Distance (Feet): 200 Feet Assistive device: None Gait Pattern/deviations: Step-through pattern     General Gait Details: ambulated in hallway with safe technique. Follows commands well and equal step length. Appears to occasionally drag R toes very  slightly. Doesn't appear to affect balance during mobility efforts.  Stairs            Wheelchair Mobility    Modified Rankin (Stroke Patients Only)       Balance Overall balance assessment: Independent                                           Pertinent Vitals/Pain Pain Assessment: No/denies pain    Home Living Family/patient expects to be discharged to:: Private residence Living Arrangements: Spouse/significant other Available Help at Discharge: Family;Available PRN/intermittently Type of Home: House Home Access: Stairs to enter Entrance Stairs-Rails: Right;Left;Can reach both Entrance Stairs-Number of Steps: 2 Home Layout: One level Home Equipment: Walker - 2 wheels;Walker - standard;Cane - single point;Wheelchair - manual Additional Comments: all above listed equipment belonged to various family membrs after surgeries and such. Pt and his spouse have stored the equipment and have access to if needed.    Prior Function Level of Independence: Independent         Comments: works FT as a Psychologist, forensic, drives. Performs all aspects of ADLs/ADL mobility with indep.     Hand Dominance        Extremity/Trunk Assessment   Upper Extremity Assessment Upper Extremity Assessment: Defer to OT evaluation    Lower Extremity Assessment Lower Extremity Assessment: Overall WFL for tasks assessed       Communication      Cognition Arousal/Alertness: Awake/alert Behavior During Therapy: [facility] for tasks assessed/performed Overall Cognitive  Status: Within Functional Limits for tasks assessed                                        General Comments      Exercises     Assessment/Plan    PT Assessment Patent does not need any further PT services  PT Problem List         PT Treatment Interventions      PT Goals (Current goals can be found in the Care Plan section)  Acute Rehab PT Goals Patient Stated Goal: to go  home today PT Goal Formulation: All assessment and education complete, DC therapy Time For Goal Achievement: 02/11/20 Potential to Achieve Goals: Good    Frequency     Barriers to discharge        Co-evaluation               AM-PAC PT "6 Clicks" Mobility  Outcome Measure Help needed turning from your back to your side while in a flat bed without using bedrails?: None Help needed moving from lying on your back to sitting on the side of a flat bed without using bedrails?: None Help needed moving to and from a bed to a chair (including a wheelchair)?: None Help needed standing up from a chair using your arms (e.g., wheelchair or bedside chair)?: None Help needed to walk in hospital room?: None Help needed climbing 3-5 steps with a railing? : None 6 Click Score: 24    End of Session Equipment Utilized During Treatment: Gait belt Activity Tolerance: Patient tolerated treatment well Patient left: in bed;with bed alarm set (seated at EOB) Nurse Communication: Mobility status PT Visit Diagnosis: Difficulty in walking, not elsewhere classified (R26.2)    Time: 1350-1407 PT Time Calculation (min) (ACUTE ONLY): 17 min   Charges:   PT Evaluation $PT Eval Low Complexity: 1 Low          Elizabeth Palau, PT, DPT [phone]   Jamaul Heist 02/11/2020, 3:42 PM

## 2020-02-11 NOTE — TOC Initial Note (Signed)
Transition of Care [facility]) - Initial/Assessment Note    Patient Details  Name: Jon Yang MRN: [id number] Date of Birth: 02/06/1966  [facility] CM/SW Contact:    Allayne Butcher, RN Phone Number: 02/11/2020, 12:58 PM  Clinical Narrative:                 Patient placed in observation for stroke.  Patient is from home with his wife, independent at home and works full time.  PT is recommending OP physical therapy and the patient agrees with this recommendation.  Patient chooses [facility] for therapy.  Referral for outpatient PT faxed to [phone].    Expected Discharge Plan: OP Rehab Barriers to Discharge: Continued Medical Work up   Patient Goals and CMS Choice   CMS Medicare.gov Compare Post Acute Care list provided to:: Patient Choice offered to / list presented to : Patient  Expected Discharge Plan and Services Expected Discharge Plan: OP Rehab   Discharge Planning Services: CM Consult Post Acute Care Choice: NA Living arrangements for the past 2 months: Single Family Home Expected Discharge Date: 02/11/20                                    Prior Living Arrangements/Services Living arrangements for the past 2 months: Single Family Home Lives with:: Spouse Patient language and need for interpreter reviewed:: Yes Do you feel safe going back to the place where you live?: Yes      Need for Family Participation in Patient Care: Yes (Comment) (stroke) Care giver support system in place?: Yes (comment) (wife)   Criminal Activity/Legal Involvement Pertinent to Current Situation/Hospitalization: No - Comment as needed  Activities of Daily Living Home Assistive Devices/Equipment: None ADL Screening (condition at time of admission) Patient's cognitive ability adequate to safely complete daily activities?: Yes Is the patient deaf or have difficulty hearing?: No Does the patient have difficulty seeing, even when wearing glasses/contacts?: No Does the patient  have difficulty concentrating, remembering, or making decisions?: No Patient able to express need for assistance with ADLs?: Yes Does the patient have difficulty dressing or bathing?: No Independently performs ADLs?: Yes (appropriate for developmental age) Does the patient have difficulty walking or climbing stairs?: No Weakness of Legs: Right Weakness of Arms/Hands: Right  Permission Sought/Granted Permission sought to share information with : Case Manager, Magazine features editor, Family Supports Permission granted to share information with : Yes, Verbal Permission Granted  Share Information with NAME: Netty Starring  Permission granted to share info w AGENCY: [facility] OP rehab  Permission granted to share info w Relationship: wife     Emotional Assessment Appearance:: Appears stated age Attitude/Demeanor/Rapport: Engaged Affect (typically observed): Accepting Orientation: : Oriented to Self, Oriented to Place, Oriented to  Time, Oriented to Situation Alcohol / Substance Use: Not Applicable Psych Involvement: No (comment)  Admission diagnosis:  Aphasia [R47.01] Stroke [facility]) [I63.9] Right sided weakness [R53.1] Patient Active Problem List   Diagnosis Date Noted  . Aphasia   . Hyperlipidemia 10/25/2019  . History of hemorrhagic stroke with residual hemiparesis (HCC) 03/06/2019  . Thrombus of pulmonary vein (HCC) 11/21/2017  . Migraines 11/03/2017  . Chest pain 10/21/2017  . Headache 10/21/2017  . COPD (chronic obstructive pulmonary disease) (HCC) 08/09/2017  . Bronchitis 08/09/2017  . Hypertension 11/10/2015  . ICH (intracerebral hemorrhage) (HCC) 11/10/2015  . Nontraumatic subcortical hemorrhage of right cerebral hemisphere (HCC) 11/10/2015  . Tobacco abuse 11/10/2015  .  Stroke [facility]) 2017   PCP:  Trey Sailors, PA-C Pharmacy:   [facility] [address], [place] - [address] [place] [address] Phone: [phone] Fax:  [phone]     Social Determinants of Health (SDOH) Interventions    Readmission Risk Interventions No flowsheet data found.

## 2020-02-11 NOTE — Progress Notes (Signed)
Subjective: Patient improved today but continues to have some right sided weakness.    Objective: Current vital signs: BP (!) 188/98   Pulse 60   Temp 97.7 F (36.5 C) (Oral)   Resp 20   Ht 5\' 10"  (1.778 m)   Wt 106.6 kg   SpO2 95%   BMI 33.72 kg/m  Vital signs in last 24 hours: Temp:  [97.7 F (36.5 C)-98 F (36.7 C)] 97.7 F (36.5 C) (07/19 0738) Pulse Rate:  [55-81] 60 (07/19 0738) Resp:  [15-23] 20 (07/19 0915) BP: (155-188)/(91-131) 188/98 (07/19 0915) SpO2:  [95 %-98 %] 95 % (07/19 0749) Weight:  [106.6 kg] 106.6 kg (07/18 1004)  Intake/Output from previous day: No intake/output data recorded. Intake/Output this shift: No intake/output data recorded. Nutritional status:  Diet Order            Diet Heart Room service appropriate? Yes; Fluid consistency: Thin  Diet effective now                 Neurologic Exam: Mental Status: Alert, oriented, thought content appropriate.  Speech fluent without evidence of aphasia.  Able to follow 3 step commands without difficulty. Cranial Nerves: II: Visual fields grossly normal, pupils equal, round, reactive to light and accommodation III,IV, VI: ptosis not present, extra-ocular motions intact bilaterally V,VII: right facial droop VIII: hearing normal bilaterally IX,X: gag reflex present XI: bilateral shoulder shrug XII: midline tongue extension Motor: Right : Upper extremity   5-/5 w/pronator drfit   Left:     Upper extremity   5/5  Lower extremity   5/5       Lower extremity   5/5 Tone and bulk:normal tone throughout; no atrophy noted   Lab Results: Basic Metabolic Panel: Recent Labs  Lab 02/10/20 1007  NA 137  K 3.9  CL 107  CO2 22  GLUCOSE 108*  BUN 11  CREATININE 0.97  CALCIUM 9.0    Liver Function Tests: Recent Labs  Lab 02/10/20 1007  AST 28  ALT 28  ALKPHOS 90  BILITOT 1.7*  PROT 7.8  ALBUMIN 4.1   No results for input(s): LIPASE, AMYLASE in the last 168 hours. No results for input(s):  AMMONIA in the last 168 hours.  CBC: Recent Labs  Lab 02/10/20 1007  WBC 10.2  NEUTROABS 6.0  HGB 18.9*  HCT 53.1*  MCV 89.2  PLT 232    Cardiac Enzymes: No results for input(s): CKTOTAL, CKMB, CKMBINDEX, TROPONINI in the last 168 hours.  Lipid Panel: Recent Labs  Lab 02/11/20 0349  CHOL 233*  TRIG 169*  HDL 33*  CHOLHDL 7.1  VLDL 34  LDLCALC 161166*    CBG: Recent Labs  Lab 02/10/20 1007  GLUCAP 107*    Microbiology: Results for orders placed or performed during the hospital encounter of 02/10/20  SARS Coronavirus 2 by RT PCR (hospital order[facility]) Nasopharyngeal Nasopharyngeal Swab     Status: None   Collection Time: 02/10/20 12:48 PM   Specimen: Nasopharyngeal Swab  Result Value Ref Range Status   SARS Coronavirus 2 NEGATIVE NEGATIVE Final    Comment: (NOTE) SARS-CoV-2 target nucleic acids are NOT DETECTED.  The SARS-CoV-2 RNA is generally detectable in upper and lower respiratory specimens during the acute phase of infection. The lowest concentration of SARS-CoV-2 viral copies this assay can detect is 250 copies / mL. A negative result does not preclude SARS-CoV-2 infection and should not be used as the sole basis for treatment  or other patient management decisions.  A negative result may occur with improper specimen collection / handling, submission of specimen other than nasopharyngeal swab, presence of viral mutation(s) within the areas targeted by this assay, and inadequate number of viral copies (<250 copies / mL). A negative result must be combined with clinical observations, patient history, and epidemiological information.  Fact Sheet for Patients:   BoilerBrush.com.cy  Fact Sheet for Healthcare Providers: https://pope.com/  This test is not yet approved or  cleared by the [place] FDA and has been authorized for detection and/or diagnosis of SARS-CoV-2  by FDA under an Emergency Use Authorization (EUA).  This EUA will remain in effect (meaning this test can be used) for the duration of the COVID-19 declaration under Section 564(b)(1) of the Act, 21 U.S.C. section 360bbb-3(b)(1), unless the authorization is terminated or revoked sooner.  Performed at [facility], [address], [place], [place] [address]     Coagulation Studies: Recent Labs    02/10/20 1007  LABPROT 12.5  INR 1.0    Imaging: CT Angio Head W or Wo Contrast  Result Date: 02/10/2020 CLINICAL DATA:  Right-sided weakness and numbness.  Slurred speech. EXAM: CT ANGIOGRAPHY HEAD AND NECK CT PERFUSION BRAIN TECHNIQUE: Multidetector CT imaging of the head and neck was performed using the standard protocol during bolus administration of intravenous contrast. Multiplanar CT image reconstructions and MIPs were obtained to evaluate the vascular anatomy. Carotid stenosis measurements (when applicable) are obtained utilizing NASCET criteria, using the distal internal carotid diameter as the denominator. Multiphase CT imaging of the brain was performed following IV bolus contrast injection. Subsequent parametric perfusion maps were calculated using RAPID software. CONTRAST:  OMNIPAQUE IOHEXOL 350 MG/ML SOLN COMPARISON:  CT head 02/10/2020 FINDINGS: CTA NECK FINDINGS Aortic arch: Standard branching. Imaged portion shows no evidence of aneurysm or dissection. No significant stenosis of the major arch vessel origins. Right carotid system: Right carotid widely patent without stenosis. Left carotid system: Left carotid widely patent without stenosis. Vertebral arteries: Both vertebral arteries widely patent to the basilar without stenosis. Skeleton: No acute skeletal abnormality. Other neck: Negative for mass or adenopathy. Upper chest: Lung apices clear bilaterally. Review of the MIP images confirms the above findings CTA HEAD FINDINGS Anterior circulation: Cavernous carotid  patent bilaterally without stenosis. Anterior and middle cerebral arteries patent bilaterally without stenosis or large vessel occlusion. Posterior circulation: Both vertebral arteries patent to the basilar without stenosis. PICA patent bilaterally. AICA patent bilaterally. Basilar widely patent. Superior cerebellar and posterior cerebral arteries patent bilaterally without stenosis. Venous sinuses: Normal venous enhancement. Anatomic variants: None Review of the MIP images confirms the above findings CT Brain Perfusion Findings: ASPECTS: 10 CBF (<30%) Volume: 71mL Perfusion (Tmax>6.0s) volume: 59mL Mismatch Volume: 40mL Infarction Location:None IMPRESSION: 1. CT perfusion negative for acute infarct or ischemia. 2. No carotid or vertebral artery stenosis on in the neck. 3. Negative for intracranial stenosis or large vessel occlusion. Electronically Signed   By: Marlan Palau M.D.   On: 02/10/2020 12:07   CT HEAD WO CONTRAST  Result Date: 02/10/2020 CLINICAL DATA:  Pt to ED via POV stating that he woke up this morning with at 0300 with slurred speech and right sided numbness and weakness. Pt states Pt states that he has hx/o brain bleed in 2017 that affected the left side. Pt unable to hold.*comment was truncated* EXAM: CT HEAD WITHOUT CONTRAST TECHNIQUE: Contiguous axial images were obtained from the base of the skull through the vertex without intravenous  contrast. COMPARISON:  CT head 03/27/2017 FINDINGS: Brain: No evidence of acute infarction, hemorrhage, hydrocephalus, extra-axial collection or mass lesion/mass effect. Vascular: No hyperdense vessel or unexpected calcification. Skull: Normal. Negative for fracture or focal lesion. Sinuses/Orbits: Partial opacification of the right frontal sinus. Otherwise sinuses are clear. Normal appearance of the orbits. Other: None. IMPRESSION: 1. No acute intracranial findings. 2. Right frontal sinus disease. Electronically Signed   By: Emmaline Kluver M.D.   On:  02/10/2020 11:02   CT Angio Neck W and/or Wo Contrast  Result Date: 02/10/2020 CLINICAL DATA:  Right-sided weakness and numbness.  Slurred speech. EXAM: CT ANGIOGRAPHY HEAD AND NECK CT PERFUSION BRAIN TECHNIQUE: Multidetector CT imaging of the head and neck was performed using the standard protocol during bolus administration of intravenous contrast. Multiplanar CT image reconstructions and MIPs were obtained to evaluate the vascular anatomy. Carotid stenosis measurements (when applicable) are obtained utilizing NASCET criteria, using the distal internal carotid diameter as the denominator. Multiphase CT imaging of the brain was performed following IV bolus contrast injection. Subsequent parametric perfusion maps were calculated using RAPID software. CONTRAST:  OMNIPAQUE IOHEXOL 350 MG/ML SOLN COMPARISON:  CT head 02/10/2020 FINDINGS: CTA NECK FINDINGS Aortic arch: Standard branching. Imaged portion shows no evidence of aneurysm or dissection. No significant stenosis of the major arch vessel origins. Right carotid system: Right carotid widely patent without stenosis. Left carotid system: Left carotid widely patent without stenosis. Vertebral arteries: Both vertebral arteries widely patent to the basilar without stenosis. Skeleton: No acute skeletal abnormality. Other neck: Negative for mass or adenopathy. Upper chest: Lung apices clear bilaterally. Review of the MIP images confirms the above findings CTA HEAD FINDINGS Anterior circulation: Cavernous carotid patent bilaterally without stenosis. Anterior and middle cerebral arteries patent bilaterally without stenosis or large vessel occlusion. Posterior circulation: Both vertebral arteries patent to the basilar without stenosis. PICA patent bilaterally. AICA patent bilaterally. Basilar widely patent. Superior cerebellar and posterior cerebral arteries patent bilaterally without stenosis. Venous sinuses: Normal venous enhancement. Anatomic variants: None  Review of the MIP images confirms the above findings CT Brain Perfusion Findings: ASPECTS: 10 CBF (<30%) Volume: 0mL Perfusion (Tmax>6.0s) volume: 0mL Mismatch Volume: 0mL Infarction Location:None IMPRESSION: 1. CT perfusion negative for acute infarct or ischemia. 2. No carotid or vertebral artery stenosis on in the neck. 3. Negative for intracranial stenosis or large vessel occlusion. Electronically Signed   By: Marlan Palau M.D.   On: 02/10/2020 12:07   MR BRAIN WO CONTRAST  Result Date: 02/10/2020 CLINICAL DATA:  Right-sided weakness, resolved EXAM: MRI HEAD WITHOUT CONTRAST TECHNIQUE: Multiplanar, multiecho pulse sequences of the brain and surrounding structures were obtained without intravenous contrast. COMPARISON:  2018 FINDINGS: Brain: There is an 8 mm focus of reduced diffusion at the left aspect of the pons. There is no intracranial mass, mass effect, or edema. There is no hydrocephalus or extra-axial fluid collection. Ventricles and sulci are within normal limits in size and configuration. There is a chronic hemorrhagic small vessel infarct of the right basal ganglia and adjacent white matter. Few small foci of T2 hyperintensity in the supratentorial white matter are nonspecific but may reflect minor chronic microvascular ischemic changes. Vascular: Major vessel flow voids at the skull base are preserved. Skull and upper cervical spine: Normal marrow signal is preserved. Sinuses/Orbits: Mild mucosal thickening.  Orbits are unremarkable. Other: Sella is unremarkable.  Mastoid air cells are clear. IMPRESSION: Small acute infarct of the left pons. Stable chronic findings detailed above. Electronically Signed   By:  Guadlupe Spanish M.D.   On: 02/10/2020 18:30   CT CEREBRAL PERFUSION W CONTRAST  Result Date: 02/10/2020 CLINICAL DATA:  Right-sided weakness and numbness.  Slurred speech. EXAM: CT ANGIOGRAPHY HEAD AND NECK CT PERFUSION BRAIN TECHNIQUE: Multidetector CT imaging of the head and neck was  performed using the standard protocol during bolus administration of intravenous contrast. Multiplanar CT image reconstructions and MIPs were obtained to evaluate the vascular anatomy. Carotid stenosis measurements (when applicable) are obtained utilizing NASCET criteria, using the distal internal carotid diameter as the denominator. Multiphase CT imaging of the brain was performed following IV bolus contrast injection. Subsequent parametric perfusion maps were calculated using RAPID software. CONTRAST:  OMNIPAQUE IOHEXOL 350 MG/ML SOLN COMPARISON:  CT head 02/10/2020 FINDINGS: CTA NECK FINDINGS Aortic arch: Standard branching. Imaged portion shows no evidence of aneurysm or dissection. No significant stenosis of the major arch vessel origins. Right carotid system: Right carotid widely patent without stenosis. Left carotid system: Left carotid widely patent without stenosis. Vertebral arteries: Both vertebral arteries widely patent to the basilar without stenosis. Skeleton: No acute skeletal abnormality. Other neck: Negative for mass or adenopathy. Upper chest: Lung apices clear bilaterally. Review of the MIP images confirms the above findings CTA HEAD FINDINGS Anterior circulation: Cavernous carotid patent bilaterally without stenosis. Anterior and middle cerebral arteries patent bilaterally without stenosis or large vessel occlusion. Posterior circulation: Both vertebral arteries patent to the basilar without stenosis. PICA patent bilaterally. AICA patent bilaterally. Basilar widely patent. Superior cerebellar and posterior cerebral arteries patent bilaterally without stenosis. Venous sinuses: Normal venous enhancement. Anatomic variants: None Review of the MIP images confirms the above findings CT Brain Perfusion Findings: ASPECTS: 10 CBF (<30%) Volume: 0mL Perfusion (Tmax>6.0s) volume: 0mL Mismatch Volume: 0mL Infarction Location:None IMPRESSION: 1. CT perfusion negative for acute infarct or ischemia. 2.  No carotid or vertebral artery stenosis on in the neck. 3. Negative for intracranial stenosis or large vessel occlusion. Electronically Signed   By: Marlan Palau M.D.   On: 02/10/2020 12:07   ECHOCARDIOGRAM COMPLETE  Result Date: 02/10/2020    ECHOCARDIOGRAM REPORT   Patient Name:   Jon Yang Date of Exam: 02/10/2020 Medical Rec #:  [id number]   Height:       70.0 in Accession #:    [id number]  Weight:       235.0 lb Date of Birth:  [birth year]  BSA:          2.235 m Patient Age:    53 years    BP:           173/94 mmHg Patient Gender: M           HR:           81 bpm. Exam Location:  [facility] Procedure: 2D Echo, Color Doppler and Cardiac Doppler Indications:     Stroke 434.91  History:         Patient has no prior history of Echocardiogram examinations.                  Stroke; Risk Factors:Hypertension.  Sonographer:     Cristela Blue RDCS (AE) Referring Phys:  Kern Reap Brien Few NIU Diagnosing Phys: Julien Nordmann MD  Sonographer Comments: No apical window and no parasternal window. Image acquisition challenging due to COPD. IMPRESSIONS  1. Left ventricular ejection fraction, by estimation, is 35 to 40%. The left ventricle has moderately decreased function. The left ventricle demonstrates regional wall motion abnormalities (Hypokinesis of the basal inferior, lateral, anterior and  anteroseptal regions). Left ventricular diastolic parameters are indeterminate.  2. Right ventricular systolic function is normal. The right ventricular size is normal. Tricuspid regurgitation signal is inadequate for assessing PA pressure.  3. Challenging images. FINDINGS  Left Ventricle: Left ventricular ejection fraction, by estimation, is 35 to 40%. The left ventricle has moderately decreased function. The left ventricle demonstrates regional wall motion abnormalities. The left ventricular internal cavity size was normal in size. There is no left ventricular hypertrophy. Left ventricular diastolic parameters are indeterminate. Right  Ventricle: The right ventricular size is normal. No increase in right ventricular wall thickness. Right ventricular systolic function is normal. Tricuspid regurgitation signal is inadequate for assessing PA pressure. Left Atrium: Left atrial size was normal in size. Right Atrium: Right atrial size was normal in size. Pericardium: There is no evidence of pericardial effusion. Mitral Valve: The mitral valve was not well visualized. Normal mobility of the mitral valve leaflets. No evidence of mitral valve regurgitation. No evidence of mitral valve stenosis. Tricuspid Valve: The tricuspid valve is not well visualized. Tricuspid valve regurgitation is not demonstrated. No evidence of tricuspid stenosis. Aortic Valve: The aortic valve was not well visualized. Aortic valve regurgitation is not visualized. No aortic stenosis is present. Pulmonic Valve: The pulmonic valve was not well visualized. Pulmonic valve regurgitation is not visualized. No evidence of pulmonic stenosis. Aorta: The aortic root is normal in size and structure. Venous: The inferior vena cava is normal in size with greater than 50% respiratory variability, suggesting right atrial pressure of 3 mmHg. IAS/Shunts: No atrial level shunt detected by color flow Doppler.  LEFT VENTRICLE PLAX 2D LVIDd:         3.39 cm LVIDs:         2.84 cm LV PW:         1.24 cm LV IVS:        1.37 cm  LEFT ATRIUM         Index LA diam:    4.70 cm 2.10 cm/m  PULMONIC VALVE PV Vmax:        1.06 m/s PV Peak grad:   4.5 mmHg RVOT Peak grad: 5 mmHg  Julien Nordmann MD Electronically signed by Julien Nordmann MD Signature Date/Time: 02/10/2020/4:26:30 PM    Final     Medications:  I have reviewed the patient's current medications. Scheduled: . aspirin EC  81 mg Oral Daily  . atorvastatin  10 mg Oral Daily  . budesonide (PULMICORT) nebulizer solution  0.25 mg Nebulization BID  . [START ON 02/12/2020] carvedilol  12.5 mg Oral BID WC  . enoxaparin (LOVENOX) injection  40 mg  Subcutaneous Q24H  . fluticasone  1 spray Each Nare BID  . ipratropium-albuterol  3 mL Nebulization BID  . nicotine  21 mg Transdermal Daily  . [START ON 02/12/2020] sacubitril-valsartan  1 tablet Oral BID  . sodium chloride flush  3 mL Intravenous Once  . [START ON 02/12/2020] spironolactone  12.5 mg Oral Daily    Assessment/Plan: 54 y.o. male  with hx of HTN, tobacco abuse and prior hemorrhagic stroke in 2017 with no residual who presented with right sided weakness.  This has improved although some right sided deficits remain.  MRI of the brain personally reviewed and reveals an acute left pontine infarct.  Patient not compliant with all medications prior to admission.   CTA of the head and neck show no evidence of hemodynamically significant stenosis.  Echocardiogram shows no cardiac source of emboli with an EF of 35-40%.  A1c pending, LDL 166.  Recommendations: 1. Smoking cessation counseling 2. Statin for lipid management with target LDL<70. 3. Dual antiplatelet therapy with ASA 81mg  and Plavix 75mg  for three weeks with change to ASA 81mg  daily alone as monotherapy after that time. 4. PT/OT  5. Telemetry 6. Cardiology evaluating patient for low EF.   7. Permissive BP management for the first 24-28 hours after acute infarct with BP control after that period of time and goal of <140/80.  Medication recommendations made by cardiology.   8. Hypercoag work up 9. Frequent neuro checks    LOS: 0 days   , MD Neurology [phone] 02/11/2020  9:56 AM

## 2020-02-11 NOTE — Discharge Summary (Signed)
Physician Discharge Summary  Jon Yang ZOX:[id number] DOB: 1966/06/19 DOA: 02/10/2020  PCP: Trey Sailors, PA-C  Admit date: 02/10/2020 Discharge date: 02/11/2020  Admitted From: Home Disposition: home   Recommendations for Outpatient Follow-up:  1. Follow up with PCP in 1-2 weeks 2. Follow-up with cardiology 3. Follow-up with neurology 4. Please obtain BMP/CBC in one week 5. Please follow up on the following pending results: Hypercoagulable work-up.  Home Health: No Equipment/Devices: None Discharge Condition: Stable CODE STATUS: Full Diet recommendation: Heart Healthy   Brief/Interim Summary: Jon Yang is a 54 y.o. male with medical history significant of hemorrhagic stroke 2017, hyperlipidemia, hypertension, COPD, tobacco abuse, who presents with difficulty speaking, right-sided weakness.  Patient states that he had hemorrhagic stroke 2017 with left-sided weakness, which has largely resolved.  He was LKW last night between 9-10 PM per his wife. Pt woke up at about 4:30 with severe dysarthria, R facial droop, RUE weakness and dragging RLE.  His symptoms have been improving.  No hearing loss or vision change. Patient has mild dry cough due to COPD, which has not changed.  Denies chest pain, shortness of breath, fever or chills.  Patient do endorse some exertional dyspnea but blames it to his COPD.  CT head was negative for acute stroke but MRI shows a small left pontine infarct.  CTA without any significant stenosis.  Risk factors are uncontrolled hypertension and hyperlipidemia along with smoking.  Patient was not pliant with his antihypertensives. For stroke work-up patient underwent echocardiogram which shows EF of 35 to 40% with global regional wall motion abnormalities.  Troponin was checked and it was 299 with a downward being trend.  Patient denies any chest pain.  Cardiology was consulted and they are recommending outpatient ischemic work-up.  Patient will follow with them  as an outpatient. Patient was started on medications for systolic heart failure.  He will start his meds from tomorrow as he is currently going through permissive hypertension due to acute CVA.  Hypercoagulable labs were drawn before discharge.  Patient will follow up with neurology as an outpatient and they can follow-up the results.  Patient does not has very significant focal deficit.  PT evaluated him and he did show some incoordination of right hand.  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  We referred him for outpatient physical therapy.  We counseled him against the use of smoking.  Per patient he cannot leave it cold [place] but try it to slowly work on it.  We did offer nicotine patch and told him to talk with his primary care physician for help if needed.  Patient will continue with rest of his home meds except we discontinue lisinopril, metoprolol and HCTZ and he was started on carvedilol, spironolactone and Entresto by cardiology.  Patient need a close follow-up with primary care physician, cardiology and neurology.   Discharge Diagnoses:  Principal Problem:   Stroke [facility]) Active Problems:   Hypertension   COPD (chronic obstructive pulmonary disease) (HCC)   Tobacco abuse   Hyperlipidemia   Aphasia  Discharge Instructions  Discharge Instructions    Diet - low sodium heart healthy   Complete by: As directed    Discharge instructions   Complete by: As directed    It was pleasure taking care of you. It is very important that you take your medications regularly and follow-up with cardiology on your scheduled appointment on this upcoming Friday at 10 AM. Follow-up with  neurology. It is also very important that you quit smoking as it can worsen your heart failure and increase the chance of another stroke.   Increase activity slowly   Complete by: As directed      Allergies as of 02/11/2020      Reactions   Shellfish  Allergy    Other reaction(s): Other (See Comments) Feels drunk Lightheaded      Medication List    STOP taking these medications   lisinopril-hydrochlorothiazide 20-12.5 MG tablet Commonly known as: ZESTORETIC   metoprolol tartrate 25 MG tablet Commonly known as: LOPRESSOR   mupirocin ointment 2 % Commonly known as: BACTROBAN   predniSONE 10 MG tablet Commonly known as: DELTASONE   testosterone cypionate 200 MG/ML injection Commonly known as: DEPOTESTOSTERONE CYPIONATE     TAKE these medications   albuterol 108 (90 Base) MCG/ACT inhaler Commonly known as: VENTOLIN HFA Inhale 2 puffs into the lungs every 6 (six) hours as needed for wheezing or shortness of breath.   amLODipine 10 MG tablet Commonly known as: NORVASC TAKE 1 TABLET BY MOUTH EVERY DAY   aspirin 81 MG EC tablet Take 1 tablet (81 mg total) by mouth daily. Swallow whole. Start taking on: February 12, 2020   atorvastatin 40 MG tablet Commonly known as: Lipitor Take 1 tablet (40 mg total) by mouth daily. What changed:   medication strength  how much to take   carvedilol 12.5 MG tablet Commonly known as: COREG Take 1 tablet (12.5 mg total) by mouth 2 (two) times daily with a meal. Start taking on: February 12, 2020   cholecalciferol 25 MCG (1000 UNIT) tablet Commonly known as: VITAMIN D Take 5,000 Units by mouth daily.   fluticasone 50 MCG/ACT nasal spray Commonly known as: FLONASE Place 1 spray into both nostrils 2 (two) times daily.   ipratropium-albuterol 0.5-2.5 (3) MG/3ML Soln Commonly known as: DUONEB Inhale 3 mLs into the lungs 4 (four) times daily as needed.   nicotine 21 mg/24hr patch Commonly known as: NICODERM CQ - dosed in mg/24 hours Place 1 patch (21 mg total) onto the skin daily. Start taking on: February 12, 2020   OptiChamber Diamond-Sm Mask Misc Use with inhalers.   sacubitril-valsartan 24-26 MG Commonly known as: ENTRESTO Take 1 tablet by mouth 2 (two) times daily. Start taking  on: February 12, 2020   spironolactone 25 MG tablet Commonly known as: ALDACTONE Take 0.5 tablets (12.5 mg total) by mouth daily. Start taking on: February 12, 2020   SUPER B COMPLEX/C PO Take 1 tablet by mouth 2 (two) times daily.   Trelegy Ellipta 100-62.5-25 MCG/INH Aepb Generic drug: Fluticasone-Umeclidin-Vilant INHALE 1 PUFF BY MOUTH EVERY DAY       Follow-up Information    Trey Sailors, PA-C. Schedule an appointment as soon as possible for a visit.   Specialty: Physician Assistant Contact information: [address] [place] [address] [place] [address] [phone]        Laurier Nancy, MD. Nyra Capes on 02/15/2020.   Specialty: Cardiology Why: At 10:00 AM Contact information: [address] [place] [address] [phone]        Marvel Plan, MD. Schedule an appointment as soon as possible for a visit.   Specialty: Neurology Contact information: [address] [place] [address] [phone]              Allergies  Allergen Reactions  . Shellfish Allergy     Other reaction(s): Other (See Comments) Feels drunk Lightheaded  Consultations: Neurology Cardiology  Procedures/Studies: CT Angio Head W or Wo Contrast  Result Date: 02/10/2020 CLINICAL DATA:  Right-sided weakness and numbness.  Slurred speech. EXAM: CT ANGIOGRAPHY HEAD AND NECK CT PERFUSION BRAIN TECHNIQUE: Multidetector CT imaging of the head and neck was performed using the standard protocol during bolus administration of intravenous contrast. Multiplanar CT image reconstructions and MIPs were obtained to evaluate the vascular anatomy. Carotid stenosis measurements (when applicable) are obtained utilizing NASCET criteria, using the distal internal carotid diameter as the denominator. Multiphase CT imaging of the brain was performed following IV bolus contrast injection. Subsequent parametric perfusion maps were calculated using RAPID software. CONTRAST:  OMNIPAQUE IOHEXOL 350  MG/ML SOLN COMPARISON:  CT head 02/10/2020 FINDINGS: CTA NECK FINDINGS Aortic arch: Standard branching. Imaged portion shows no evidence of aneurysm or dissection. No significant stenosis of the major arch vessel origins. Right carotid system: Right carotid widely patent without stenosis. Left carotid system: Left carotid widely patent without stenosis. Vertebral arteries: Both vertebral arteries widely patent to the basilar without stenosis. Skeleton: No acute skeletal abnormality. Other neck: Negative for mass or adenopathy. Upper chest: Lung apices clear bilaterally. Review of the MIP images confirms the above findings CTA HEAD FINDINGS Anterior circulation: Cavernous carotid patent bilaterally without stenosis. Anterior and middle cerebral arteries patent bilaterally without stenosis or large vessel occlusion. Posterior circulation: Both vertebral arteries patent to the basilar without stenosis. PICA patent bilaterally. AICA patent bilaterally. Basilar widely patent. Superior cerebellar and posterior cerebral arteries patent bilaterally without stenosis. Venous sinuses: Normal venous enhancement. Anatomic variants: None Review of the MIP images confirms the above findings CT Brain Perfusion Findings: ASPECTS: 10 CBF (<30%) Volume: 55mL Perfusion (Tmax>6.0s) volume: 48mL Mismatch Volume: 47mL Infarction Location:None IMPRESSION: 1. CT perfusion negative for acute infarct or ischemia. 2. No carotid or vertebral artery stenosis on in the neck. 3. Negative for intracranial stenosis or large vessel occlusion. Electronically Signed   By: Marlan Palau M.D.   On: 02/10/2020 12:07   CT HEAD WO CONTRAST  Result Date: 02/10/2020 CLINICAL DATA:  Pt to ED via POV stating that he woke up this morning with at 0300 with slurred speech and right sided numbness and weakness. Pt states Pt states that he has hx/o brain bleed in 2017 that affected the left side. Pt unable to hold.*comment was truncated* EXAM: CT HEAD WITHOUT  CONTRAST TECHNIQUE: Contiguous axial images were obtained from the base of the skull through the vertex without intravenous contrast. COMPARISON:  CT head 03/27/2017 FINDINGS: Brain: No evidence of acute infarction, hemorrhage, hydrocephalus, extra-axial collection or mass lesion/mass effect. Vascular: No hyperdense vessel or unexpected calcification. Skull: Normal. Negative for fracture or focal lesion. Sinuses/Orbits: Partial opacification of the right frontal sinus. Otherwise sinuses are clear. Normal appearance of the orbits. Other: None. IMPRESSION: 1. No acute intracranial findings. 2. Right frontal sinus disease. Electronically Signed   By: Emmaline Kluver M.D.   On: 02/10/2020 11:02   CT Angio Neck W and/or Wo Contrast  Result Date: 02/10/2020 CLINICAL DATA:  Right-sided weakness and numbness.  Slurred speech. EXAM: CT ANGIOGRAPHY HEAD AND NECK CT PERFUSION BRAIN TECHNIQUE: Multidetector CT imaging of the head and neck was performed using the standard protocol during bolus administration of intravenous contrast. Multiplanar CT image reconstructions and MIPs were obtained to evaluate the vascular anatomy. Carotid stenosis measurements (when applicable) are obtained utilizing NASCET criteria, using the distal internal carotid diameter as the denominator. Multiphase CT imaging of the brain was performed following IV bolus  contrast injection. Subsequent parametric perfusion maps were calculated using RAPID software. CONTRAST:  OMNIPAQUE IOHEXOL 350 MG/ML SOLN COMPARISON:  CT head 02/10/2020 FINDINGS: CTA NECK FINDINGS Aortic arch: Standard branching. Imaged portion shows no evidence of aneurysm or dissection. No significant stenosis of the major arch vessel origins. Right carotid system: Right carotid widely patent without stenosis. Left carotid system: Left carotid widely patent without stenosis. Vertebral arteries: Both vertebral arteries widely patent to the basilar without stenosis. Skeleton: No  acute skeletal abnormality. Other neck: Negative for mass or adenopathy. Upper chest: Lung apices clear bilaterally. Review of the MIP images confirms the above findings CTA HEAD FINDINGS Anterior circulation: Cavernous carotid patent bilaterally without stenosis. Anterior and middle cerebral arteries patent bilaterally without stenosis or large vessel occlusion. Posterior circulation: Both vertebral arteries patent to the basilar without stenosis. PICA patent bilaterally. AICA patent bilaterally. Basilar widely patent. Superior cerebellar and posterior cerebral arteries patent bilaterally without stenosis. Venous sinuses: Normal venous enhancement. Anatomic variants: None Review of the MIP images confirms the above findings CT Brain Perfusion Findings: ASPECTS: 10 CBF (<30%) Volume: 0mL Perfusion (Tmax>6.0s) volume: 0mL Mismatch Volume: 0mL Infarction Location:None IMPRESSION: 1. CT perfusion negative for acute infarct or ischemia. 2. No carotid or vertebral artery stenosis on in the neck. 3. Negative for intracranial stenosis or large vessel occlusion. Electronically Signed   By: Marlan Palau M.D.   On: 02/10/2020 12:07   MR BRAIN WO CONTRAST  Result Date: 02/10/2020 CLINICAL DATA:  Right-sided weakness, resolved EXAM: MRI HEAD WITHOUT CONTRAST TECHNIQUE: Multiplanar, multiecho pulse sequences of the brain and surrounding structures were obtained without intravenous contrast. COMPARISON:  2018 FINDINGS: Brain: There is an 8 mm focus of reduced diffusion at the left aspect of the pons. There is no intracranial mass, mass effect, or edema. There is no hydrocephalus or extra-axial fluid collection. Ventricles and sulci are within normal limits in size and configuration. There is a chronic hemorrhagic small vessel infarct of the right basal ganglia and adjacent white matter. Few small foci of T2 hyperintensity in the supratentorial white matter are nonspecific but may reflect minor chronic microvascular ischemic  changes. Vascular: Major vessel flow voids at the skull base are preserved. Skull and upper cervical spine: Normal marrow signal is preserved. Sinuses/Orbits: Mild mucosal thickening.  Orbits are unremarkable. Other: Sella is unremarkable.  Mastoid air cells are clear. IMPRESSION: Small acute infarct of the left pons. Stable chronic findings detailed above. Electronically Signed   By: Guadlupe Spanish M.D.   On: 02/10/2020 18:30   CT CEREBRAL PERFUSION W CONTRAST  Result Date: 02/10/2020 CLINICAL DATA:  Right-sided weakness and numbness.  Slurred speech. EXAM: CT ANGIOGRAPHY HEAD AND NECK CT PERFUSION BRAIN TECHNIQUE: Multidetector CT imaging of the head and neck was performed using the standard protocol during bolus administration of intravenous contrast. Multiplanar CT image reconstructions and MIPs were obtained to evaluate the vascular anatomy. Carotid stenosis measurements (when applicable) are obtained utilizing NASCET criteria, using the distal internal carotid diameter as the denominator. Multiphase CT imaging of the brain was performed following IV bolus contrast injection. Subsequent parametric perfusion maps were calculated using RAPID software. CONTRAST:  OMNIPAQUE IOHEXOL 350 MG/ML SOLN COMPARISON:  CT head 02/10/2020 FINDINGS: CTA NECK FINDINGS Aortic arch: Standard branching. Imaged portion shows no evidence of aneurysm or dissection. No significant stenosis of the major arch vessel origins. Right carotid system: Right carotid widely patent without stenosis. Left carotid system: Left carotid widely patent without stenosis. Vertebral arteries: Both vertebral arteries  widely patent to the basilar without stenosis. Skeleton: No acute skeletal abnormality. Other neck: Negative for mass or adenopathy. Upper chest: Lung apices clear bilaterally. Review of the MIP images confirms the above findings CTA HEAD FINDINGS Anterior circulation: Cavernous carotid patent bilaterally without stenosis. Anterior  and middle cerebral arteries patent bilaterally without stenosis or large vessel occlusion. Posterior circulation: Both vertebral arteries patent to the basilar without stenosis. PICA patent bilaterally. AICA patent bilaterally. Basilar widely patent. Superior cerebellar and posterior cerebral arteries patent bilaterally without stenosis. Venous sinuses: Normal venous enhancement. Anatomic variants: None Review of the MIP images confirms the above findings CT Brain Perfusion Findings: ASPECTS: 10 CBF (<30%) Volume: 74mL Perfusion (Tmax>6.0s) volume: 41mL Mismatch Volume: 80mL Infarction Location:None IMPRESSION: 1. CT perfusion negative for acute infarct or ischemia. 2. No carotid or vertebral artery stenosis on in the neck. 3. Negative for intracranial stenosis or large vessel occlusion. Electronically Signed   By: Marlan Palau M.D.   On: 02/10/2020 12:07   ECHOCARDIOGRAM COMPLETE  Result Date: 02/10/2020    ECHOCARDIOGRAM REPORT   Patient Name:   Jon Yang Date of Exam: 02/10/2020 Medical Rec #:  [id number]   Height:       70.0 in Accession #:    [id number]  Weight:       235.0 lb Date of Birth:  07/23/1966  BSA:          2.235 m Patient Age:    53 years    BP:           173/94 mmHg Patient Gender: M           HR:           81 bpm. Exam Location:  [facility] Procedure: 2D Echo, Color Doppler and Cardiac Doppler Indications:     Stroke 434.91  History:         Patient has no prior history of Echocardiogram examinations.                  Stroke; Risk Factors:Hypertension.  Sonographer:     Cristela Blue RDCS (AE) Referring Phys:  Kern Reap Brien Few NIU Diagnosing Phys: Julien Nordmann MD  Sonographer Comments: No apical window and no parasternal window. Image acquisition challenging due to COPD. IMPRESSIONS  1. Left ventricular ejection fraction, by estimation, is 35 to 40%. The left ventricle has moderately decreased function. The left ventricle demonstrates regional wall motion abnormalities (Hypokinesis of the basal inferior,  lateral, anterior and anteroseptal regions). Left ventricular diastolic parameters are indeterminate.  2. Right ventricular systolic function is normal. The right ventricular size is normal. Tricuspid regurgitation signal is inadequate for assessing PA pressure.  3. Challenging images. FINDINGS  Left Ventricle: Left ventricular ejection fraction, by estimation, is 35 to 40%. The left ventricle has moderately decreased function. The left ventricle demonstrates regional wall motion abnormalities. The left ventricular internal cavity size was normal in size. There is no left ventricular hypertrophy. Left ventricular diastolic parameters are indeterminate. Right Ventricle: The right ventricular size is normal. No increase in right ventricular wall thickness. Right ventricular systolic function is normal. Tricuspid regurgitation signal is inadequate for assessing PA pressure. Left Atrium: Left atrial size was normal in size. Right Atrium: Right atrial size was normal in size. Pericardium: There is no evidence of pericardial effusion. Mitral Valve: The mitral valve was not well visualized. Normal mobility of the mitral valve leaflets. No evidence of mitral valve regurgitation. No evidence of mitral valve stenosis. Tricuspid Valve: The tricuspid valve is  not well visualized. Tricuspid valve regurgitation is not demonstrated. No evidence of tricuspid stenosis. Aortic Valve: The aortic valve was not well visualized. Aortic valve regurgitation is not visualized. No aortic stenosis is present. Pulmonic Valve: The pulmonic valve was not well visualized. Pulmonic valve regurgitation is not visualized. No evidence of pulmonic stenosis. Aorta: The aortic root is normal in size and structure. Venous: The inferior vena cava is normal in size with greater than 50% respiratory variability, suggesting right atrial pressure of 3 mmHg. IAS/Shunts: No atrial level shunt detected by color flow Doppler.  LEFT VENTRICLE PLAX 2D LVIDd:          3.39 cm LVIDs:         2.84 cm LV PW:         1.24 cm LV IVS:        1.37 cm  LEFT ATRIUM         Index LA diam:    4.70 cm 2.10 cm/m  PULMONIC VALVE PV Vmax:        1.06 m/s PV Peak grad:   4.5 mmHg RVOT Peak grad: 5 mmHg  Julien Nordmannimothy Gollan MD Electronically signed by Julien Nordmannimothy Gollan MD Signature Date/Time: 02/10/2020/4:26:30 PM    Final      Subjective: Patient has no new complaint today.  He wants to go home.  Stating that he will follow up with all the providers as directed.  Wife was in the room.  Counseled against smoking.  Discharge Exam: Vitals:   02/11/20 1107 02/11/20 1118  BP: (!) 182/100 (!) 159/94  Pulse: (!) 55 (!) 55  Resp: 13   Temp: 98.1 F (36.7 C)   SpO2: 96%    Vitals:   02/11/20 0915 02/11/20 1055 02/11/20 1107 02/11/20 1118  BP: (!) 188/98 (!) 172/93 (!) 182/100 (!) 159/94  Pulse:   (!) 55 (!) 55  Resp: 20 16 13    Temp:   98.1 F (36.7 C)   TempSrc:   Oral   SpO2:   96%   Weight:      Height:        General: Pt is alert, awake, not in acute distress Cardiovascular: RRR, S1/S2 +, no rubs, no gallops Respiratory: CTA bilaterally, no wheezing, no rhonchi Abdominal: Soft, NT, ND, bowel sounds + Extremities: no edema, no cyanosis   The results of significant diagnostics from this hospitalization (including imaging, microbiology, ancillary and laboratory) are listed below for reference.    Microbiology: Recent Results (from the past 240 hour(s))  SARS Coronavirus 2 by RT PCR (hospital order, [facility]) Nasopharyngeal Nasopharyngeal Swab     Status: None   Collection Time: 02/10/20 12:48 PM   Specimen: Nasopharyngeal Swab  Result Value Ref Range Status   SARS Coronavirus 2 NEGATIVE NEGATIVE Final    Comment: (NOTE) SARS-CoV-2 target nucleic acids are NOT DETECTED.  The SARS-CoV-2 RNA is generally detectable in upper and lower respiratory specimens during the acute phase of infection. The lowest concentration of SARS-CoV-2  viral copies this assay can detect is 250 copies / mL. A negative result does not preclude SARS-CoV-2 infection and should not be used as the sole basis for treatment or other patient management decisions.  A negative result may occur with improper specimen collection / handling, submission of specimen other than nasopharyngeal swab, presence of viral mutation(s) within the areas targeted by this assay, and inadequate number of viral copies (<250 copies / mL). A negative result must be combined with  clinical observations, patient history, and epidemiological information.  Fact Sheet for Patients:   BoilerBrush.com.cy  Fact Sheet for Healthcare Providers: https://pope.com/  This test is not yet approved or  cleared by the [place] FDA and has been authorized for detection and/or diagnosis of SARS-CoV-2 by FDA under an Emergency Use Authorization (EUA).  This EUA will remain in effect (meaning this test can be used) for the duration of the COVID-19 declaration under Section 564(b)(1) of the Act, 21 U.S.C. section 360bbb-3(b)(1), unless the authorization is terminated or revoked sooner.  Performed at [facility], [address] [place], [place] [address]      Labs: BNP (last 3 results) No results for input(s): BNP in the last 8760 hours. Basic Metabolic Panel: Recent Labs  Lab 02/10/20 1007  NA 137  K 3.9  CL 107  CO2 22  GLUCOSE 108*  BUN 11  CREATININE 0.97  CALCIUM 9.0   Liver Function Tests: Recent Labs  Lab 02/10/20 1007  AST 28  ALT 28  ALKPHOS 90  BILITOT 1.7*  PROT 7.8  ALBUMIN 4.1   No results for input(s): LIPASE, AMYLASE in the last 168 hours. No results for input(s): AMMONIA in the last 168 hours. CBC: Recent Labs  Lab 02/10/20 1007  WBC 10.2  NEUTROABS 6.0  HGB 18.9*  HCT 53.1*  MCV 89.2  PLT 232   Cardiac Enzymes: No results for input(s): CKTOTAL, CKMB, CKMBINDEX, TROPONINI  in the last 168 hours. BNP: Invalid input(s): POCBNP CBG: Recent Labs  Lab 02/10/20 1007  GLUCAP 107*   D-Dimer No results for input(s): DDIMER in the last 72 hours. Hgb A1c Recent Labs    02/11/20 0349  HGBA1C 5.4   Lipid Profile Recent Labs    02/11/20 0349  CHOL 233*  HDL 33*  LDLCALC 166*  TRIG 169*  CHOLHDL 7.1   Thyroid function studies No results for input(s): TSH, T4TOTAL, T3FREE, THYROIDAB in the last 72 hours.  Invalid input(s): FREET3 Anemia work up No results for input(s): VITAMINB12, FOLATE, FERRITIN, TIBC, IRON, RETICCTPCT in the last 72 hours. Urinalysis    Component Value Date/Time   BILIRUBINUR Negative 05/08/2019 1437   PROTEINUR Negative 05/08/2019 1437   UROBILINOGEN 0.2 05/08/2019 1437   NITRITE Negative 05/08/2019 1437   LEUKOCYTESUR Negative 05/08/2019 1437   Sepsis Labs Invalid input(s): PROCALCITONIN,  WBC,  LACTICIDVEN Microbiology Recent Results (from the past 240 hour(s))  SARS Coronavirus 2 by RT PCR (hospital order, performed in [facility] hospital lab) Nasopharyngeal Nasopharyngeal Swab     Status: None   Collection Time: 02/10/20 12:48 PM   Specimen: Nasopharyngeal Swab  Result Value Ref Range Status   SARS Coronavirus 2 NEGATIVE NEGATIVE Final    Comment: (NOTE) SARS-CoV-2 target nucleic acids are NOT DETECTED.  The SARS-CoV-2 RNA is generally detectable in upper and lower respiratory specimens during the acute phase of infection. The lowest concentration of SARS-CoV-2 viral copies this assay can detect is 250 copies / mL. A negative result does not preclude SARS-CoV-2 infection and should not be used as the sole basis for treatment or other patient management decisions.  A negative result may occur with improper specimen collection / handling, submission of specimen other than nasopharyngeal swab, presence of viral mutation(s) within the areas targeted by this assay, and inadequate number of viral copies (<250 copies /  mL). A negative result must be combined with clinical observations, patient history, and epidemiological information.  Fact Sheet for Patients:   BoilerBrush.com.cy  Fact Sheet for Healthcare Providers: https://pope.com/  This test is not yet approved or  cleared by the [place] FDA and has been authorized for detection and/or diagnosis of SARS-CoV-2 by FDA under an Emergency Use Authorization (EUA).  This EUA will remain in effect (meaning this test can be used) for the duration of the COVID-19 declaration under Section 564(b)(1) of the Act, 21 U.S.C. section 360bbb-3(b)(1), unless the authorization is terminated or revoked sooner.  Performed at [facility], [address] [place], [place] [address]     Time coordinating discharge: Over 30 minutes  SIGNED:  Arnetha Courser, MD  [facility] 02/11/2020, 1:02 PM  If 7PM-7AM, please contact night-coverage [email or web address]  This record has been created using Conservation officer, historic buildings. Errors have been sought and corrected,but may not always be located. Such creation errors do not reflect on the standard of care.

## 2020-02-11 NOTE — Progress Notes (Signed)
SLP Cancellation Note  Patient Details Name: Jon Yang MRN: [id number] DOB: 1966-06-07   Cancelled treatment:       Reason Eval/Treat Not Completed:  (chart reviewed; consulted NSG then met w/ pt in room). Pt denied any difficulty swallowing and is currently on a regular diet; tolerates swallowing pills w/ water per NSG. Pt conversed at conversational level w/out gross deficits noted; pt denied any language deficits(A/O x4, and good communication w/ other staff noted), however, he stated he felt "certain" speech sounds were not as clear. Pt is MISSING front Dentition which can impact articulatory precision of speech especially in light of weakness. Encouraged pt to consider coming in for Outpatient ST servcies in order for a full Dysarthria assessment if he feels it does not clear in the next 1-3 days. Pt and family member agreed. NSG to reconsult if any change in status while admitted.   For Exercise to target Speech: pt and family were instructed to use Over-articulation and increased Volume of speech as well as Slowing his rate to improve articulation -- this can be practiced w/ reading from the menu, headlines on papers in room then other short reading items once at home. Pt practiced these strategies w/ SLP, and agreed to continue this at home.     Orinda Kenner, [place], CCC-SLP Gladyce Mcray 02/11/2020, 2:51 PM

## 2020-02-11 NOTE — Consult Note (Signed)
Nelda SevereBilly Mercer is a 54 y.o. male  [id number]  Primary Cardiologist: Adrian BlackwaterShaukat Nadirah Socorro Reason for Consultation: Elevated troponin  HPI: This is a 54 year old white male with a past medical history of hypertension 2 packs/day smoking presented to the hospital with weakness in the right arm and leg and was found to have pontine stroke.  According to neurologist it probably happened within the last 24 hours this is having acute stroke.  I was asked to help evaluate the patient because of elevated troponin to around 300 and to see if patient would need cardiac catheterization.  Patient denies any chest pain shortness of breath and weakness on the right side is getting better.   Review of Systems: No chest pain or shortness of breath   Past Medical History:  Diagnosis Date  . Hypertension   . Stroke [facility](HCC) 2017    Facility-Administered Medications Prior to Admission  Medication Dose Route Frequency Provider Last Rate Last Admin  . ipratropium-albuterol (DUONEB) 0.5-2.5 (3) MG/3ML nebulizer solution 3 mL  3 mL Nebulization Once Pollak, Adriana M, PA-C       Medications Prior to Admission  Medication Sig Dispense Refill  . fluticasone (FLONASE) 50 MCG/ACT nasal spray Place 1 spray into both nostrils 2 (two) times daily.  0  . ipratropium-albuterol (DUONEB) 0.5-2.5 (3) MG/3ML SOLN Inhale 3 mLs into the lungs 4 (four) times daily as needed. 360 mL 5  . TRELEGY ELLIPTA 100-62.5-25 MCG/INH AEPB INHALE 1 PUFF BY MOUTH EVERY DAY 60 each 2  . albuterol (VENTOLIN HFA) 108 (90 Base) MCG/ACT inhaler Inhale 2 puffs into the lungs every 6 (six) hours as needed for wheezing or shortness of breath. 8 g 3  . amLODipine (NORVASC) 10 MG tablet TAKE 1 TABLET BY MOUTH EVERY DAY (Patient not taking: Reported on 02/10/2020) 90 tablet 0  . atorvastatin (LIPITOR) 10 MG tablet TAKE 1 TABLET BY MOUTH EVERY DAY (Patient not taking: Reported on 02/10/2020) 90 tablet 1  . cholecalciferol (VITAMIN D) 1000 units tablet Take 5,000  Units by mouth daily. (Patient not taking: Reported on 02/10/2020)    . lisinopril-hydrochlorothiazide (ZESTORETIC) 20-12.5 MG tablet TAKE 1 TABLET BY MOUTH TWICE A DAY (Patient not taking: Reported on 02/10/2020) 180 tablet 0  . metoprolol tartrate (LOPRESSOR) 25 MG tablet TAKE 1 TABLET BY MOUTH TWICE A DAY (Patient not taking: Reported on 02/10/2020) 180 tablet 1  . mupirocin ointment (BACTROBAN) 2 % Place 1 application into the nose 2 (two) times daily. (Patient not taking: Reported on 02/10/2020) 22 g 0  . predniSONE (DELTASONE) 10 MG tablet Take 6 pills on day 1, take 5 pills on day 2, and so on until complete. (Patient not taking: Reported on 02/10/2020) 21 tablet 0  . Spacer/Aero-Holding Chambers (OPTICHAMBER DIAMOND-SM MASK) MISC Use with inhalers.    . SUPER B COMPLEX/C PO Take 1 tablet by mouth 2 (two) times daily. (Patient not taking: Reported on 02/10/2020)    . testosterone cypionate (DEPOTESTOSTERONE CYPIONATE) 200 MG/ML injection Inject into the muscle. (Patient not taking: Reported on 02/10/2020)       . aspirin EC  81 mg Oral Daily  . atorvastatin  10 mg Oral Daily  . budesonide (PULMICORT) nebulizer solution  0.25 mg Nebulization BID  . [START ON 02/12/2020] carvedilol  12.5 mg Oral BID WC  . enoxaparin (LOVENOX) injection  40 mg Subcutaneous Q24H  . fluticasone  1 spray Each Nare BID  . ipratropium-albuterol  3 mL Nebulization BID  . nicotine  21  mg Transdermal Daily  . [START ON 02/12/2020] sacubitril-valsartan  1 tablet Oral BID  . sodium chloride flush  3 mL Intravenous Once  . [START ON 02/12/2020] spironolactone  12.5 mg Oral Daily    Infusions:   Allergies  Allergen Reactions  . Shellfish Allergy     Other reaction(s): Other (See Comments) Feels drunk Lightheaded    Social History   Socioeconomic History  . Marital status: Single    Spouse name: Not on file  . Number of children: Not on file  . Years of education: Not on file  . Highest education level: Not on  file  Occupational History  . Not on file  Tobacco Use  . Smoking status: Current Every Day Smoker  . Smokeless tobacco: Never Used  Vaping Use  . Vaping Use: Never used  Substance and Sexual Activity  . Alcohol use: No  . Drug use: No  . Sexual activity: Not on file  Other Topics Concern  . Not on file  Social History Narrative  . Not on file   Social Determinants of Health   Financial Resource Strain:   . Difficulty of Paying Living Expenses:   Food Insecurity:   . Worried About Programme researcher, broadcasting/film/video in the Last Year:   . Barista in the Last Year:   Transportation Needs:   . Freight forwarder (Medical):   Marland Kitchen Lack of Transportation (Non-Medical):   Physical Activity:   . Days of Exercise per Week:   . Minutes of Exercise per Session:   Stress:   . Feeling of Stress :   Social Connections:   . Frequency of Communication with Friends and Family:   . Frequency of Social Gatherings with Friends and Family:   . Attends Religious Services:   . Active Member of Clubs or Organizations:   . Attends Banker Meetings:   Marland Kitchen Marital Status:   Intimate Partner Violence:   . Fear of Current or Ex-Partner:   . Emotionally Abused:   Marland Kitchen Physically Abused:   . Sexually Abused:     Family History  Problem Relation Age of Onset  . Healthy Mother   . Lung cancer Father   . Prostate cancer Father   . Healthy Sister   . Hypertension Brother   . Healthy Sister     PHYSICAL EXAM: Vitals:   02/11/20 0749 02/11/20 0915  BP:  (!) 188/98  Pulse:    Resp:  20  Temp:    SpO2: 95%     No intake or output data in the 24 hours ending 02/11/20 1027  General:  Well appearing. No respiratory difficulty HEENT: normal Neck: supple. no JVD. Carotids 2+ bilat; no bruits. No lymphadenopathy or thryomegaly appreciated. Cor: PMI nondisplaced. Regular rate & rhythm. No rubs, gallops or murmurs. Lungs: clear Abdomen: soft, nontender, nondistended. No  hepatosplenomegaly. No bruits or masses. Good bowel sounds. Extremities: no cyanosis, clubbing, rash, edema Neuro: alert & oriented x 3, cranial nerves grossly intact. moves all 4 extremities w/o difficulty. Affect pleasant.  ECG: Sinus rhythm otherwise within normal limit  Results for orders placed or performed during the hospital encounter of 02/10/20 (from the past 24 hour(s))  SARS Coronavirus 2 by RT PCR (hospital order, performed in [facility] hospital lab) Nasopharyngeal Nasopharyngeal Swab     Status: None   Collection Time: 02/10/20 12:48 PM   Specimen: Nasopharyngeal Swab  Result Value Ref Range   SARS Coronavirus 2 NEGATIVE NEGATIVE  Lipid panel     Status: Abnormal   Collection Time: 02/11/20  3:49 AM  Result Value Ref Range   Cholesterol 233 (H) 0 - 200 mg/dL   Triglycerides 951 (H) <150 mg/dL   HDL 33 (L) >88 mg/dL   Total CHOL/HDL Ratio 7.1 RATIO   VLDL 34 0 - 40 mg/dL   LDL Cholesterol 416 (H) 0 - 99 mg/dL  Troponin I (High Sensitivity)     Status: Abnormal   Collection Time: 02/11/20  7:54 AM  Result Value Ref Range   Troponin I (High Sensitivity) 299 (HH) <18 ng/L  Troponin I (High Sensitivity)     Status: Abnormal   Collection Time: 02/11/20  9:28 AM  Result Value Ref Range   Troponin I (High Sensitivity) 275 (HH) <18 ng/L   CT Angio Head W or Wo Contrast  Result Date: 02/10/2020 CLINICAL DATA:  Right-sided weakness and numbness.  Slurred speech. EXAM: CT ANGIOGRAPHY HEAD AND NECK CT PERFUSION BRAIN TECHNIQUE: Multidetector CT imaging of the head and neck was performed using the standard protocol during bolus administration of intravenous contrast. Multiplanar CT image reconstructions and MIPs were obtained to evaluate the vascular anatomy. Carotid stenosis measurements (when applicable) are obtained utilizing NASCET criteria, using the distal internal carotid diameter as the denominator. Multiphase CT imaging of the brain was performed following IV bolus contrast  injection. Subsequent parametric perfusion maps were calculated using RAPID software. CONTRAST:  OMNIPAQUE IOHEXOL 350 MG/ML SOLN COMPARISON:  CT head 02/10/2020 FINDINGS: CTA NECK FINDINGS Aortic arch: Standard branching. Imaged portion shows no evidence of aneurysm or dissection. No significant stenosis of the major arch vessel origins. Right carotid system: Right carotid widely patent without stenosis. Left carotid system: Left carotid widely patent without stenosis. Vertebral arteries: Both vertebral arteries widely patent to the basilar without stenosis. Skeleton: No acute skeletal abnormality. Other neck: Negative for mass or adenopathy. Upper chest: Lung apices clear bilaterally. Review of the MIP images confirms the above findings CTA HEAD FINDINGS Anterior circulation: Cavernous carotid patent bilaterally without stenosis. Anterior and middle cerebral arteries patent bilaterally without stenosis or large vessel occlusion. Posterior circulation: Both vertebral arteries patent to the basilar without stenosis. PICA patent bilaterally. AICA patent bilaterally. Basilar widely patent. Superior cerebellar and posterior cerebral arteries patent bilaterally without stenosis. Venous sinuses: Normal venous enhancement. Anatomic variants: None Review of the MIP images confirms the above findings CT Brain Perfusion Findings: ASPECTS: 10 CBF (<30%) Volume: 12mL Perfusion (Tmax>6.0s) volume: 53mL Mismatch Volume: 65mL Infarction Location:None IMPRESSION: 1. CT perfusion negative for acute infarct or ischemia. 2. No carotid or vertebral artery stenosis on in the neck. 3. Negative for intracranial stenosis or large vessel occlusion. Electronically Signed   By: Marlan Palau M.D.   On: 02/10/2020 12:07   CT HEAD WO CONTRAST  Result Date: 02/10/2020 CLINICAL DATA:  Pt to ED via POV stating that he woke up this morning with at 0300 with slurred speech and right sided numbness and weakness. Pt states Pt states that he  has hx/o brain bleed in 2017 that affected the left side. Pt unable to hold.*comment was truncated* EXAM: CT HEAD WITHOUT CONTRAST TECHNIQUE: Contiguous axial images were obtained from the base of the skull through the vertex without intravenous contrast. COMPARISON:  CT head 03/27/2017 FINDINGS: Brain: No evidence of acute infarction, hemorrhage, hydrocephalus, extra-axial collection or mass lesion/mass effect. Vascular: No hyperdense vessel or unexpected calcification. Skull: Normal. Negative for fracture or focal lesion. Sinuses/Orbits: Partial opacification of the right  frontal sinus. Otherwise sinuses are clear. Normal appearance of the orbits. Other: None. IMPRESSION: 1. No acute intracranial findings. 2. Right frontal sinus disease. Electronically Signed   By: Emmaline Kluver M.D.   On: 02/10/2020 11:02   CT Angio Neck W and/or Wo Contrast  Result Date: 02/10/2020 CLINICAL DATA:  Right-sided weakness and numbness.  Slurred speech. EXAM: CT ANGIOGRAPHY HEAD AND NECK CT PERFUSION BRAIN TECHNIQUE: Multidetector CT imaging of the head and neck was performed using the standard protocol during bolus administration of intravenous contrast. Multiplanar CT image reconstructions and MIPs were obtained to evaluate the vascular anatomy. Carotid stenosis measurements (when applicable) are obtained utilizing NASCET criteria, using the distal internal carotid diameter as the denominator. Multiphase CT imaging of the brain was performed following IV bolus contrast injection. Subsequent parametric perfusion maps were calculated using RAPID software. CONTRAST:  OMNIPAQUE IOHEXOL 350 MG/ML SOLN COMPARISON:  CT head 02/10/2020 FINDINGS: CTA NECK FINDINGS Aortic arch: Standard branching. Imaged portion shows no evidence of aneurysm or dissection. No significant stenosis of the major arch vessel origins. Right carotid system: Right carotid widely patent without stenosis. Left carotid system: Left carotid widely patent  without stenosis. Vertebral arteries: Both vertebral arteries widely patent to the basilar without stenosis. Skeleton: No acute skeletal abnormality. Other neck: Negative for mass or adenopathy. Upper chest: Lung apices clear bilaterally. Review of the MIP images confirms the above findings CTA HEAD FINDINGS Anterior circulation: Cavernous carotid patent bilaterally without stenosis. Anterior and middle cerebral arteries patent bilaterally without stenosis or large vessel occlusion. Posterior circulation: Both vertebral arteries patent to the basilar without stenosis. PICA patent bilaterally. AICA patent bilaterally. Basilar widely patent. Superior cerebellar and posterior cerebral arteries patent bilaterally without stenosis. Venous sinuses: Normal venous enhancement. Anatomic variants: None Review of the MIP images confirms the above findings CT Brain Perfusion Findings: ASPECTS: 10 CBF (<30%) Volume: 0mL Perfusion (Tmax>6.0s) volume: 0mL Mismatch Volume: 0mL Infarction Location:None IMPRESSION: 1. CT perfusion negative for acute infarct or ischemia. 2. No carotid or vertebral artery stenosis on in the neck. 3. Negative for intracranial stenosis or large vessel occlusion. Electronically Signed   By: Marlan Palau M.D.   On: 02/10/2020 12:07   MR BRAIN WO CONTRAST  Result Date: 02/10/2020 CLINICAL DATA:  Right-sided weakness, resolved EXAM: MRI HEAD WITHOUT CONTRAST TECHNIQUE: Multiplanar, multiecho pulse sequences of the brain and surrounding structures were obtained without intravenous contrast. COMPARISON:  2018 FINDINGS: Brain: There is an 8 mm focus of reduced diffusion at the left aspect of the pons. There is no intracranial mass, mass effect, or edema. There is no hydrocephalus or extra-axial fluid collection. Ventricles and sulci are within normal limits in size and configuration. There is a chronic hemorrhagic small vessel infarct of the right basal ganglia and adjacent white matter. Few small foci of  T2 hyperintensity in the supratentorial white matter are nonspecific but may reflect minor chronic microvascular ischemic changes. Vascular: Major vessel flow voids at the skull base are preserved. Skull and upper cervical spine: Normal marrow signal is preserved. Sinuses/Orbits: Mild mucosal thickening.  Orbits are unremarkable. Other: Sella is unremarkable.  Mastoid air cells are clear. IMPRESSION: Small acute infarct of the left pons. Stable chronic findings detailed above. Electronically Signed   By: Guadlupe Spanish M.D.   On: 02/10/2020 18:30   CT CEREBRAL PERFUSION W CONTRAST  Result Date: 02/10/2020 CLINICAL DATA:  Right-sided weakness and numbness.  Slurred speech. EXAM: CT ANGIOGRAPHY HEAD AND NECK CT PERFUSION BRAIN TECHNIQUE: Multidetector CT  imaging of the head and neck was performed using the standard protocol during bolus administration of intravenous contrast. Multiplanar CT image reconstructions and MIPs were obtained to evaluate the vascular anatomy. Carotid stenosis measurements (when applicable) are obtained utilizing NASCET criteria, using the distal internal carotid diameter as the denominator. Multiphase CT imaging of the brain was performed following IV bolus contrast injection. Subsequent parametric perfusion maps were calculated using RAPID software. CONTRAST:  OMNIPAQUE IOHEXOL 350 MG/ML SOLN COMPARISON:  CT head 02/10/2020 FINDINGS: CTA NECK FINDINGS Aortic arch: Standard branching. Imaged portion shows no evidence of aneurysm or dissection. No significant stenosis of the major arch vessel origins. Right carotid system: Right carotid widely patent without stenosis. Left carotid system: Left carotid widely patent without stenosis. Vertebral arteries: Both vertebral arteries widely patent to the basilar without stenosis. Skeleton: No acute skeletal abnormality. Other neck: Negative for mass or adenopathy. Upper chest: Lung apices clear bilaterally. Review of the MIP images confirms  the above findings CTA HEAD FINDINGS Anterior circulation: Cavernous carotid patent bilaterally without stenosis. Anterior and middle cerebral arteries patent bilaterally without stenosis or large vessel occlusion. Posterior circulation: Both vertebral arteries patent to the basilar without stenosis. PICA patent bilaterally. AICA patent bilaterally. Basilar widely patent. Superior cerebellar and posterior cerebral arteries patent bilaterally without stenosis. Venous sinuses: Normal venous enhancement. Anatomic variants: None Review of the MIP images confirms the above findings CT Brain Perfusion Findings: ASPECTS: 10 CBF (<30%) Volume: 0mL Perfusion (Tmax>6.0s) volume: 0mL Mismatch Volume: 0mL Infarction Location:None IMPRESSION: 1. CT perfusion negative for acute infarct or ischemia. 2. No carotid or vertebral artery stenosis on in the neck. 3. Negative for intracranial stenosis or large vessel occlusion. Electronically Signed   By: Marlan Palau M.D.   On: 02/10/2020 12:07   ECHOCARDIOGRAM COMPLETE  Result Date: 02/10/2020    ECHOCARDIOGRAM REPORT   Patient Name:   RODOLFO Matousek Date of Exam: 02/10/2020 Medical Rec #:  [id number]   Height:       70.0 in Accession #:    [id number]  Weight:       235.0 lb Date of Birth:  1966-06-09  BSA:          2.235 m Patient Age:    53 years    BP:           173/94 mmHg Patient Gender: M           HR:           81 bpm. Exam Location:  [facility] Procedure: 2D Echo, Color Doppler and Cardiac Doppler Indications:     Stroke 434.91  History:         Patient has no prior history of Echocardiogram examinations.                  Stroke; Risk Factors:Hypertension.  Sonographer:     Cristela Blue RDCS (AE) Referring Phys:  Kern Reap Brien Few NIU Diagnosing Phys: Julien Nordmann MD  Sonographer Comments: No apical window and no parasternal window. Image acquisition challenging due to COPD. IMPRESSIONS  1. Left ventricular ejection fraction, by estimation, is 35 to 40%. The left ventricle has moderately  decreased function. The left ventricle demonstrates regional wall motion abnormalities (Hypokinesis of the basal inferior, lateral, anterior and anteroseptal regions). Left ventricular diastolic parameters are indeterminate.  2. Right ventricular systolic function is normal. The right ventricular size is normal. Tricuspid regurgitation signal is inadequate for assessing PA pressure.  3. Challenging images. FINDINGS  Left Ventricle: Left ventricular ejection  fraction, by estimation, is 35 to 40%. The left ventricle has moderately decreased function. The left ventricle demonstrates regional wall motion abnormalities. The left ventricular internal cavity size was normal in size. There is no left ventricular hypertrophy. Left ventricular diastolic parameters are indeterminate. Right Ventricle: The right ventricular size is normal. No increase in right ventricular wall thickness. Right ventricular systolic function is normal. Tricuspid regurgitation signal is inadequate for assessing PA pressure. Left Atrium: Left atrial size was normal in size. Right Atrium: Right atrial size was normal in size. Pericardium: There is no evidence of pericardial effusion. Mitral Valve: The mitral valve was not well visualized. Normal mobility of the mitral valve leaflets. No evidence of mitral valve regurgitation. No evidence of mitral valve stenosis. Tricuspid Valve: The tricuspid valve is not well visualized. Tricuspid valve regurgitation is not demonstrated. No evidence of tricuspid stenosis. Aortic Valve: The aortic valve was not well visualized. Aortic valve regurgitation is not visualized. No aortic stenosis is present. Pulmonic Valve: The pulmonic valve was not well visualized. Pulmonic valve regurgitation is not visualized. No evidence of pulmonic stenosis. Aorta: The aortic root is normal in size and structure. Venous: The inferior vena cava is normal in size with greater than 50% respiratory variability, suggesting right atrial  pressure of 3 mmHg. IAS/Shunts: No atrial level shunt detected by color flow Doppler.  LEFT VENTRICLE PLAX 2D LVIDd:         3.39 cm LVIDs:         2.84 cm LV PW:         1.24 cm LV IVS:        1.37 cm  LEFT ATRIUM         Index LA diam:    4.70 cm 2.10 cm/m  PULMONIC VALVE PV Vmax:        1.06 m/s PV Peak grad:   4.5 mmHg RVOT Peak grad: 5 mmHg  Julien Nordmann MD Electronically signed by Julien Nordmann MD Signature Date/Time: 02/10/2020/4:26:30 PM    Final      ASSESSMENT AND PLAN: Acute stroke within the past 24 hours in the pontine region.  Acute stroke can cause elevated troponin since patient is not having any chest pain and EKG is normal cardiac catheterization is not indicated especially under acute CVA.  Discussed this with the neurologist and agreed with conservative treatment.  However patient does have moderate to severe LV dysfunction with left ventricular ejection fraction 35 to 40% with uncontrolled hypertension.  Under the setting of acute stroke is not a good idea to lower the blood pressure first 24 hours below 170.  I discussed that with the neurologist and would like to start treatment for cardiomyopathy with Entresto carvedilol and Aldactone darting tomorrow since we like to keep a higher blood pressure first 24 hours.  Patient does have probably ischemic cardiomyopathy and would need work-up for the coronaries but discussed with the family that we will do CTA coronaries in the office upon discharge.  In the meantime we will treat the patient cardiomyopathy with above medications but will hold off for the first 24 hours.  Norrine Ballester A

## 2020-02-11 NOTE — Evaluation (Signed)
Occupational Therapy Evaluation Patient Details Name: Jon Yang MRN: [id number] DOB: February 17, 1966 Today's Date: 02/11/2020    History of Present Illness Jon Yang is a 54 y.o. male with medical history significant of hemorrhagic stroke 2017, hyperlipidemia, hypertension, COPD, tobacco abuse, who presents with difficulty speaking, right-sided weakness. W/u included: CT perfusion negative for acute infarct or ischemia, MRI w/o contrast + for small L pons infarct.   Clinical Impression   Pt seen for OT evaluation this date. Prior to hospital admission, pt was INDEP in all aspects of ADL/IADL, working FT as a heavy Arboriculturist, and driving. Pt lives with spouse in [facility] with 2 STE. Pt reports having various pieces of medical equipment from family members if needed, but reports he does not use these items. Pt presents this date with some R UE weakness and incoordination. While he is able to perform all ADLs and fxl mobility with no AD or AE, it is noted that increased time and some difficulty occurs for fine motor tasks such as buttoning and opening small containers/screw tops. In addition, it's noted that his R foot drags very slightly with fxl mobility requiring 1 cue to attend. Pt wants to be able to return to work. OT recommends OPOT to improve R hand coordination/speed to improve likelihood of safe return to work if applicable. Overall, pt tolerates session well and could benefit from continued f/u.     Follow Up Recommendations  Outpatient OT    Equipment Recommendations  None recommended by OT    Recommendations for Other Services       Precautions / Restrictions Precautions Precautions: Fall Restrictions Weight Bearing Restrictions: No      Mobility Bed Mobility Overal bed mobility: Modified Independent             General bed mobility comments: extended time  Transfers Overall transfer level: Modified independent               General transfer comment:  extended time    Balance Overall balance assessment: Mild deficits observed, not formally tested                                         ADL either performed or assessed with clinical judgement   ADL Overall ADL's : Modified independent                                       General ADL Comments: Able to perform fxl mobility and ADL transfers with no AD, Supv, with G balance with MIN verbal cues to attend to R foot clearance. Able to complete all ADLs at Indep-MOD I (difficulty with fine motor tasks such as opening containers and screw tops for grooming tasks)     Vision Baseline Vision/History: Wears glasses Wears Glasses: At all times Patient Visual Report: No change from baseline Additional Comments: Pt reports R eye has been weaker since childhood-dx around age 31 by pediatrician.     Perception     Praxis      Pertinent Vitals/Pain Pain Assessment: No/denies pain     Hand Dominance Right   Extremity/Trunk Assessment Upper Extremity Assessment Upper Extremity Assessment: RUE deficits/detail;LUE deficits/detail RUE Deficits / Details: appears to have presence of dysdiadochokinesia on RAM assessment with R UE moving slower than L. ROM WFL. MMT 4+/5.  RUE Coordination: decreased fine motor;decreased gross motor LUE Deficits / Details: ROM WFL, MMT 5/5   Lower Extremity Assessment Lower Extremity Assessment: Defer to PT evaluation   Cervical / Trunk Assessment Cervical / Trunk Assessment: Normal   Communication Communication Communication: Other (comment) (pt speech is 100% intelligible, but somewhat unclear on certain sounds such as "-er" sounds.)   Cognition Arousal/Alertness: Awake/alert Behavior During Therapy: WFL for tasks assessed/performed Overall Cognitive Status: Within Functional Limits for tasks assessed                                     General Comments  Pt is noted to very slightly drag R foot, but  with one verbal cue to attend to foot clearance, pt able to adjust. Demos G balance with fxl mobility with no AD. No gross LOB witnessed.    Exercises Other Exercises Other Exercises: OT facilitates education re: role of OT and potential benfit with OPOT f/u for coordination. Pt and spouse with good understanding. Other Exercises: OT facilitates ed re: [facility] exercises including digit flex/ext of MCPs/PIPs/DIPs, add/abd, and thumb opposition/reposition. Handout issued. Could benefit from f/u.   Shoulder Instructions      Home Living Family/patient expects to be discharged to:: Private residence Living Arrangements: Spouse/significant other Available Help at Discharge: Family;Available PRN/intermittently Type of Home: House Home Access: Stairs to enter [employer] of Steps: 2 Entrance Stairs-Rails: Right;Left;Can reach both Home Layout: One level         Bathroom Toilet: Standard     Home Equipment: Environmental consultant - 2 wheels;Walker - standard;Cane - single point;Wheelchair - manual   Additional Comments: all above listed equipment belonged to various family membrs after surgeries and such. Pt and his spouse have stored the equipment and have access to if needed.      Prior Functioning/Environment Level of Independence: Independent        Comments: works FT as a Psychologist, forensic, drives. Performs all aspects of ADLs/ADL mobility with indep.        OT Problem List: Decreased strength;Impaired balance (sitting and/or standing);Decreased coordination      OT Treatment/Interventions: Self-care/ADL training;Therapeutic exercise;Therapeutic activities;Patient/family education;Balance training    OT Goals(Current goals can be found in the care plan section) Acute Rehab OT Goals Patient Stated Goal: to go home today OT Goal Formulation: With patient/family Time For Goal Achievement: 02/25/20 Potential to Achieve Goals: Good  OT Frequency: Min 1X/week   Barriers to  D/C:            Co-evaluation              AM-PAC OT "6 Clicks" Daily Activity     Outcome Measure Help from another person eating meals?: None Help from another person taking care of personal grooming?: None Help from another person toileting, which includes using toliet, bedpan, or urinal?: None Help from another person bathing (including washing, rinsing, drying)?: None Help from another person to put on and taking off regular upper body clothing?: None Help from another person to put on and taking off regular lower body clothing?: None 6 Click Score: 24   End of Session Equipment Utilized During Treatment: Gait belt Nurse Communication: Mobility status;Other (comment) (communicated to attending MD noted slower response of R UE on RAM assessment.)  Activity Tolerance: Patient tolerated treatment well Patient left: in bed;with call bell/phone within reach;with family/visitor present  OT Visit Diagnosis: Other abnormalities of  gait and mobility (R26.89);Muscle weakness (generalized) (M62.81)                Time: 7829-5621 OT Time Calculation (min): 27 min Charges:  OT General Charges $OT Visit: 1 Visit OT Evaluation $OT Eval Low Complexity: 1 Low  Rejeana Brock, MS, OTR/L ascom [phone] 02/11/20, 10:43 AM

## 2020-02-12 LAB — LUPUS ANTICOAGULANT PANEL
DRVVT: 32.7 s (ref 0.0–47.0)
PTT Lupus Anticoagulant: 33.3 s (ref 0.0–51.9)

## 2020-02-12 LAB — BETA-2-GLYCOPROTEIN I ABS, IGG/M/A
Beta-2 Glyco I IgG: <9 GPI IgG units (ref 0–20)
Beta-2-Glycoprotein I IgA: <9 GPI IgA units (ref 0–25)
Beta-2-Glycoprotein I IgM: <9 GPI IgM units (ref 0–32)

## 2020-02-12 LAB — PROTEIN C ACTIVITY: Protein C Activity: 114 % (ref 73–180)

## 2020-02-12 LAB — PROTEIN S, TOTAL: Protein S Ag, Total: 114 % (ref 60–150)

## 2020-02-12 LAB — HOMOCYSTEINE: Homocysteine: 12.6 umol/L (ref 0.0–14.5)

## 2020-02-12 LAB — PROTEIN S ACTIVITY: Protein S Activity: 103 % (ref 63–140)

## 2020-02-13 LAB — CARDIOLIPIN ANTIBODIES, IGG, IGM, IGA
Anticardiolipin IgA: <9 APL U/mL (ref 0–11)
Anticardiolipin IgG: 11 GPL U/mL (ref 0–14)
Anticardiolipin IgM: <9 MPL U/mL (ref 0–12)

## 2020-02-14 LAB — PROTHROMBIN GENE MUTATION

## 2020-02-14 LAB — FACTOR 5 LEIDEN

## 2020-02-15 DIAGNOSIS — I209 Angina pectoris, unspecified: Secondary | ICD-10-CM | POA: Diagnosis not present

## 2020-02-15 DIAGNOSIS — R69 Illness, unspecified: Secondary | ICD-10-CM | POA: Diagnosis not present

## 2020-02-15 DIAGNOSIS — E782 Mixed hyperlipidemia: Secondary | ICD-10-CM | POA: Diagnosis not present

## 2020-02-15 DIAGNOSIS — G4730 Sleep apnea, unspecified: Secondary | ICD-10-CM | POA: Diagnosis not present

## 2020-02-15 DIAGNOSIS — I509 Heart failure, unspecified: Secondary | ICD-10-CM | POA: Diagnosis not present

## 2020-02-15 DIAGNOSIS — I6389 Other cerebral infarction: Secondary | ICD-10-CM | POA: Diagnosis not present

## 2020-02-15 DIAGNOSIS — I110 Hypertensive heart disease with heart failure: Secondary | ICD-10-CM | POA: Diagnosis not present

## 2020-02-15 DIAGNOSIS — R609 Edema, unspecified: Secondary | ICD-10-CM | POA: Diagnosis not present

## 2020-02-15 DIAGNOSIS — I10 Essential (primary) hypertension: Secondary | ICD-10-CM | POA: Diagnosis not present

## 2020-02-15 LAB — PROTEIN C, TOTAL: Protein C, Total: 110 % (ref 60–150)

## 2020-02-18 ENCOUNTER — Ambulatory Visit (INDEPENDENT_AMBULATORY_CARE_PROVIDER_SITE_OTHER): Payer: 59 | Admitting: Physician Assistant

## 2020-02-18 ENCOUNTER — Other Ambulatory Visit: Payer: Self-pay

## 2020-02-18 ENCOUNTER — Encounter: Payer: Self-pay | Admitting: Physician Assistant

## 2020-02-18 VITALS — BP 128/86 | HR 65 | Temp 96.9°F | Wt 228.8 lb

## 2020-02-18 DIAGNOSIS — I10 Essential (primary) hypertension: Secondary | ICD-10-CM

## 2020-02-18 DIAGNOSIS — D6851 Activated protein C resistance: Secondary | ICD-10-CM

## 2020-02-18 DIAGNOSIS — R29818 Other symptoms and signs involving the nervous system: Secondary | ICD-10-CM

## 2020-02-18 DIAGNOSIS — I5021 Acute systolic (congestive) heart failure: Secondary | ICD-10-CM

## 2020-02-18 DIAGNOSIS — E785 Hyperlipidemia, unspecified: Secondary | ICD-10-CM

## 2020-02-18 DIAGNOSIS — J449 Chronic obstructive pulmonary disease, unspecified: Secondary | ICD-10-CM | POA: Diagnosis not present

## 2020-02-18 DIAGNOSIS — I639 Cerebral infarction, unspecified: Secondary | ICD-10-CM | POA: Diagnosis not present

## 2020-02-18 MED ORDER — CLOPIDOGREL BISULFATE 75 MG PO TABS: 75.0000 mg | ORAL_TABLET | Freq: Every day | ORAL | 0 refills | Status: DC

## 2020-02-18 NOTE — Progress Notes (Signed)
Established patient visit   Patient: Jon Yang   DOB: 1966/03/10   54 y.o. Male  MRN: [id number] Visit Date: 02/18/2020  Today's healthcare provider: Trey Sailors, PA-C   Chief Complaint  Patient presents with  . Hospitalization Follow-up  I,Donovan Gatchel M Mishael Haran,acting as a scribe for Trey Sailors, PA-C.,have documented all relevant documentation on the behalf of Trey Sailors, PA-C,as directed by  Trey Sailors, PA-C while in the presence of Trey Sailors, PA-C.  Subjective    HPI  Follow up Hospitalization  Patient was admitted to [facility] on 02/10/2020 and discharged on 02/11/2020. He was treated for acute ischemic stroke in his left pons. Treatment for this included CT scan, labs. Telephone follow up was done on none. He reports good compliance with treatment. He reports this condition is improved.   Patient with a history of tobacco abuse, prior hemorraghic CVA 2016, HLD, and HTN presented with right sided deficits on 02/10/2020. MRI showed acute ischemic stroke of left pons though he was out of the tPA window. He was admitted to the hospital for management. Historically he has been noncompliant with both HTN and HLD medications. Cholesterol in the hospital was elevated as was his blood pressure. Patient at the time was prescribed amlodipine 10 mg daily, lisinopril 40 mg daily, and metoprolol 25 mg daily. During his hospitalization he experienced troponin elevation upwards of 299. This was thought to represent demand ischemia and outpatient workup was recommended. While in the hospital he underwent echocardiogram which showed 35-40% EF. His BP medications were changed to spironolactone 25 mg QD, entresto 24-26 mg, coreg 12.5 mg BID and amlodipine 10 mg QD. He was recommended by neurology to be discharged on dual antiplatelet therapy for three weeks before transitioning to ASA monotherapy however he was discharged on ASA alone. He was also found to be factor V leidein  positive. This is a new diagnosis for him. He was smoking three packs per day and is now down to one. Declines cessation aids. He was also noncompliant with chronic medications but reports taking them now.   Reports daytime fatigue and drowsiness, reports episodes of snoring and choking.   Patient reports improvement in motor function but still has poor coordination. Upcoming neurology appointment 02/26/2020 [facility].    ----------------------------------------------------------------------------------------- - Results of the Epworth flowsheet 02/18/2020  Sitting and reading 3  Watching TV 2  Sitting, inactive in a public place (e.g. a theatre or a meeting) 2  As a passenger in a car for an hour without a break 3  Lying down to rest in the afternoon when circumstances permit 2  Sitting and talking to someone 0  Sitting quietly after a lunch without alcohol 2  In a car, while stopped for a few minutes in traffic 1  Total score 15         Medications: Outpatient Medications Prior to Visit  Medication Sig  . albuterol (VENTOLIN HFA) 108 (90 Base) MCG/ACT inhaler Inhale 2 puffs into the lungs every 6 (six) hours as needed for wheezing or shortness of breath.  Marland Kitchen aspirin EC 81 MG EC tablet Take 1 tablet (81 mg total) by mouth daily. Swallow whole.  Marland Kitchen atorvastatin (LIPITOR) 40 MG tablet Take 1 tablet (40 mg total) by mouth daily.  . carvedilol (COREG) 12.5 MG tablet Take 1 tablet (12.5 mg total) by mouth 2 (two) times daily with a meal.  . hydrALAZINE (APRESOLINE) 50 MG tablet Take 50 mg by mouth  2 (two) times daily.  Marland Kitchen ipratropium-albuterol (DUONEB) 0.5-2.5 (3) MG/3ML SOLN Inhale 3 mLs into the lungs 4 (four) times daily as needed.  . sacubitril-valsartan (ENTRESTO) 24-26 MG Take 1 tablet by mouth 2 (two) times daily.  Marland Kitchen Spacer/Aero-Holding Chambers (OPTICHAMBER DIAMOND-SM MASK) MISC Use with inhalers.  Marland Kitchen spironolactone (ALDACTONE) 25 MG tablet Take 0.5 tablets (12.5 mg total) by  mouth daily.  . TRELEGY ELLIPTA 100-62.5-25 MCG/INH AEPB INHALE 1 PUFF BY MOUTH EVERY DAY  . amLODipine (NORVASC) 10 MG tablet TAKE 1 TABLET BY MOUTH EVERY DAY (Patient not taking: Reported on 02/10/2020)  . cholecalciferol (VITAMIN D) 1000 units tablet Take 5,000 Units by mouth daily. (Patient not taking: Reported on 02/10/2020)  . fluticasone (FLONASE) 50 MCG/ACT nasal spray Place 1 spray into both nostrils 2 (two) times daily.  . nicotine (NICODERM CQ - DOSED IN MG/24 HOURS) 21 mg/24hr patch Place 1 patch (21 mg total) onto the skin daily. (Patient not taking: Reported on 02/18/2020)  . SUPER B COMPLEX/C PO Take 1 tablet by mouth 2 (two) times daily. (Patient not taking: Reported on 02/10/2020)   No facility-administered medications prior to visit.    Review of Systems  Respiratory: Negative.   Cardiovascular: Negative.   Hematological: Negative.       Objective    BP (!) 128/86 (BP Location: Left Arm, Patient Position: Sitting, Cuff Size: Normal)   Pulse 65   Temp (!) 96.9 F (36.1 C) (Temporal)   Wt (!) 228 lb 12.8 oz (103.8 kg)   SpO2 98%   BMI 32.83 kg/m    Physical Exam Constitutional:      Appearance: Normal appearance.  Eyes:     Extraocular Movements: Extraocular movements intact.     Pupils: Pupils are equal, round, and reactive to light.  Cardiovascular:     Rate and Rhythm: Normal rate and regular rhythm.     Heart sounds: Normal heart sounds.  Pulmonary:     Effort: Pulmonary effort is normal.     Breath sounds: Normal breath sounds.  Musculoskeletal:     Comments: Strength 5/5 bilateral upper extremities.   Skin:    General: Skin is warm and dry.  Neurological:     General: No focal deficit present.     Mental Status: He is alert and oriented to person, place, and time.     Cranial Nerves: No cranial nerve deficit.     Motor: No weakness.  Psychiatric:        Mood and Affect: Mood normal.        Behavior: Behavior normal.       No results found  for any visits on 02/18/20.  Assessment & Plan    1. Cerebrovascular accident (CVA), unspecified mechanism (HCC)  Some residual deficits from stroke, mainly coordination. Strength has largely returned.   2. Suspected sleep apnea  Ordered sleep study  3. Factor 5 Leiden mutation, heterozygous [facility])  This is a new diagnosis for him. I have started Plavix 75 mg daily in addition to his ASA as requested by hospital neurology. Referring to oncology for further anti-coagulation management, would appreciate their evaluation and recommendations.  - Ambulatory referral to Hematology / Oncology  4. Acute systolic heart failure (HCC)  We will establish him with [facility] cardiologist.   - Ambulatory referral to Cardiology  5. Essential hypertension  Well controlled, taking medications.  6. Chronic obstructive pulmonary disease, unspecified COPD type (HCC)  Continues with inhalers, down from 3 packs daily to one pack  daily. Advised to stop completely.   7. Hyperlipidemia, unspecified hyperlipidemia type  Patient reports he is taking medications now where he was not before.     No follow-ups on file.      ITrey Sailors, PA-C, have reviewed all documentation for this visit. The documentation on 02/18/20 for the exam, diagnosis, procedures, and orders are all accurate and complete.    Maryella Shivers  [facility] [phone] (phone) [phone] (fax)  [facility]

## 2020-02-22 ENCOUNTER — Ambulatory Visit (INDEPENDENT_AMBULATORY_CARE_PROVIDER_SITE_OTHER): Payer: 59 | Admitting: Cardiology

## 2020-02-22 ENCOUNTER — Other Ambulatory Visit: Payer: Self-pay

## 2020-02-22 ENCOUNTER — Encounter: Payer: Self-pay | Admitting: Cardiology

## 2020-02-22 VITALS — BP 110/84 | HR 69 | Ht 69.0 in | Wt 230.5 lb

## 2020-02-22 DIAGNOSIS — I10 Essential (primary) hypertension: Secondary | ICD-10-CM | POA: Diagnosis not present

## 2020-02-22 DIAGNOSIS — F17200 Nicotine dependence, unspecified, uncomplicated: Secondary | ICD-10-CM | POA: Diagnosis not present

## 2020-02-22 DIAGNOSIS — R931 Abnormal findings on diagnostic imaging of heart and coronary circulation: Secondary | ICD-10-CM | POA: Diagnosis not present

## 2020-02-22 DIAGNOSIS — E7800 Pure hypercholesterolemia, unspecified: Secondary | ICD-10-CM

## 2020-02-22 DIAGNOSIS — R69 Illness, unspecified: Secondary | ICD-10-CM | POA: Diagnosis not present

## 2020-02-22 MED ORDER — HYDRALAZINE HCL 25 MG PO TABS
25.0000 mg | ORAL_TABLET | Freq: Two times a day (BID) | ORAL | 3 refills | Status: DC
Start: 2020-02-22 — End: 2020-03-28

## 2020-02-22 NOTE — Patient Instructions (Addendum)
Medication Instructions:   Your physician has recommended you make the following change in your medication:   DECREASE your hydrALAZINE (APRESOLINE): Take 1 tablet (25 mg total) by mouth 2 (two) times daily  *If you need a refill on your cardiac medications before your next appointment, please call your pharmacy*   Lab Work: None Ordered If you have labs (blood work) drawn today and your tests are completely normal, you will receive your results only by: Marland Kitchen MyChart Message (if you have MyChart) OR . A paper copy in the mail If you have any lab test that is abnormal or we need to change your treatment, we will call you to review the results.   Testing/Procedures:  Your physician has requested that you have an echocardiogram. Echocardiography is a painless test that uses sound waves to create images of your heart. It provides your doctor with information about the size and shape of your heart and how well your heart's chambers and valves are working. This procedure takes approximately one hour. There are no restrictions for this procedure.     Follow-Up: At [facility], you and your health needs are our priority.  As part of our continuing mission to provide you with exceptional heart care, we have created designated Provider Care Teams.  These Care Teams include your primary Cardiologist (physician) and Advanced Practice Providers (APPs -  Physician Assistants and Nurse Practitioners) who all work together to provide you with the care you need, when you need it.  We recommend signing up for the patient portal called "MyChart".  Sign up information is provided on this After Visit Summary.  MyChart is used to connect with patients for Virtual Visits (Telemedicine).  Patients are able to view lab/test results, encounter notes, upcoming appointments, etc.  Non-urgent messages can be sent to your provider as well.   To learn more about what you can do with MyChart, go to  [email or web address].    Your next appointment:   Follow up after Echo   The format for your next appointment:   In Person  Provider:   Debbe Odea, MD   Other Instructions

## 2020-02-22 NOTE — Progress Notes (Signed)
Cardiology Office Note:    Date:  02/22/2020   ID:  Jon Yang, DOB [birth year], MRN [id number]  PCP:  Trey Sailors, PA-C  [facility] Cardiologist:  Debbe Odea, MD  [facility] Electrophysiologist:  None   Referring MD: Trey Sailors, PA-C   Chief Complaint  Patient presents with  . New Patient (Initial Visit)    Hospital F/U-CVA; Meds verbally reviewed with patient.   Jon Yang is a 54 y.o. male who is being seen today for the evaluation of cardiomyopathy at the request of Jodi Marble, Adriana M, [place].   History of Present Illness:    Jon Yang is a 54 y.o. male with a hx of hypertension, hyperlipidemia, COPD, current smoker x 40+yrs, hemorrhagic stroke 2017,  Ischemic CVA 01/2020 who presents to establish care.   Patient was seen on 02/11/2020 due to difficulty speaking and right-sided weakness including right facial droop right upper extremity weakness and weakness in the right lower extremity.  Presented to the ED where head CT did not show any acute change, MRI showed small left pontine infarct.  CTA neck without sick significant stenosis.  Echocardiogram in the hospital had reading of moderately reduced ejection fraction, EF 35 to 40%.  Patient denied chest pain.  Ischemic work-up was recommended as outpatient.  Patient started on Coreg, Entresto, Aldactone prior to discharge.  He states feeling well since discharge.  He is back to about 95% of his prior strength.  Tolerating his medications okay although he feels more tired due to some of his meds.  Past Medical History:  Diagnosis Date  . Hyperlipidemia   . Hypertension   . Stroke [facility]) 2017    Past Surgical History:  Procedure Laterality Date  . NO PAST SURGERIES      Current Medications: Current Meds  Medication Sig  . albuterol (VENTOLIN HFA) 108 (90 Base) MCG/ACT inhaler Inhale 2 puffs into the lungs every 6 (six) hours as needed for wheezing or shortness of breath.  Marland Kitchen aspirin EC 81 MG EC  tablet Take 1 tablet (81 mg total) by mouth daily. Swallow whole.  Marland Kitchen atorvastatin (LIPITOR) 40 MG tablet Take 1 tablet (40 mg total) by mouth daily.  . carvedilol (COREG) 12.5 MG tablet Take 1 tablet (12.5 mg total) by mouth 2 (two) times daily with a meal.  . fluticasone (FLONASE) 50 MCG/ACT nasal spray Place 1 spray into both nostrils 2 (two) times daily.  . Fluticasone-Umeclidin-Vilant (TRELEGY ELLIPTA) 100-62.5-25 MCG/INH AEPB Inhale 1 puff into the lungs daily as needed.  Marland Kitchen ipratropium-albuterol (DUONEB) 0.5-2.5 (3) MG/3ML SOLN Inhale 3 mLs into the lungs 4 (four) times daily as needed.  . sacubitril-valsartan (ENTRESTO) 24-26 MG Take 1 tablet by mouth 2 (two) times daily.  Marland Kitchen Spacer/Aero-Holding Chambers (OPTICHAMBER DIAMOND-SM MASK) MISC Use with inhalers.  Marland Kitchen spironolactone (ALDACTONE) 25 MG tablet Take 0.5 tablets (12.5 mg total) by mouth daily.  . [DISCONTINUED] hydrALAZINE (APRESOLINE) 50 MG tablet Take 50 mg by mouth 2 (two) times daily.     Allergies:   Shellfish allergy   Social History   Socioeconomic History  . Marital status: Single    Spouse name: Not on file  . Number of children: Not on file  . Years of education: Not on file  . Highest education level: Not on file  Occupational History  . Not on file  Tobacco Use  . Smoking status: Current Every Day Smoker    Packs/day: 1.00  . Smokeless tobacco: Never Used  Vaping Use  .  Vaping Use: Never used  Substance and Sexual Activity  . Alcohol use: No  . Drug use: No  . Sexual activity: Not on file  Other Topics Concern  . Not on file  Social History Narrative  . Not on file   Social Determinants of Health   Financial Resource Strain:   . Difficulty of Paying Living Expenses:   Food Insecurity:   . Worried About Programme researcher, broadcasting/film/video in the Last Year:   . Barista in the Last Year:   Transportation Needs:   . Freight forwarder (Medical):   Marland Kitchen Lack of Transportation (Non-Medical):   Physical  Activity:   . Days of Exercise per Week:   . Minutes of Exercise per Session:   Stress:   . Feeling of Stress :   Social Connections:   . Frequency of Communication with Friends and Family:   . Frequency of Social Gatherings with Friends and Family:   . Attends Religious Services:   . Active Member of Clubs or Organizations:   . Attends Banker Meetings:   Marland Kitchen Marital Status:      Family History: The patient's family history includes Healthy in his mother, sister, and sister; Hypertension in his brother; Lung cancer in his father; Prostate cancer in his father.  ROS:   Please see the history of present illness.     All other systems reviewed and are negative.  EKGs/Labs/Other Studies Reviewed:    The following studies were reviewed today:   EKG:  EKG is  ordered today.  The ekg ordered today demonstrates normal sinus rhythm, normal ECG.  Recent Labs: 02/10/2020: ALT 28; BUN 11; Creatinine, Ser 0.97; Hemoglobin 18.9; Platelets 232; Potassium 3.9; Sodium 137  Recent Lipid Panel    Component Value Date/Time   CHOL 233 (H) 02/11/2020 0349   CHOL 224 (H) 03/06/2019 0855   TRIG 169 (H) 02/11/2020 0349   HDL 33 (L) 02/11/2020 0349   HDL 37 (L) 03/06/2019 0855   CHOLHDL 7.1 02/11/2020 0349   VLDL 34 02/11/2020 0349   LDLCALC 166 (H) 02/11/2020 0349   LDLCALC 155 (H) 03/06/2019 0855    Physical Exam:    VS:  BP 110/84 (BP Location: Right Arm, Patient Position: Sitting, Cuff Size: Large)   Pulse 69   Ht 5\' 9"  (1.753 m)   Wt (!) 230 lb 8 oz (104.6 kg)   SpO2 94%   BMI 34.04 kg/m     Wt Readings from Last 3 Encounters:  02/22/20 (!) 230 lb 8 oz (104.6 kg)  02/18/20 (!) 228 lb 12.8 oz (103.8 kg)  02/10/20 235 lb (106.6 kg)     GEN:  Well nourished, well developed in no acute distress HEENT: Normal NECK: No JVD; No carotid bruits LYMPHATICS: No lymphadenopathy CARDIAC: RRR, no murmurs, rubs, gallops RESPIRATORY: Expiratory wheezing noted ABDOMEN: Soft,  non-tender, non-distended MUSCULOSKELETAL:  No edema; No deformity  SKIN: Warm and dry NEUROLOGIC:  Alert and oriented x 3 PSYCHIATRIC:  Normal affect   ASSESSMENT:    1. Abnormal echocardiogram   2. Essential hypertension   3. Pure hypercholesterolemia   4. Smoking    PLAN:    In order of problems listed above:  1. Patient with recent admission for stroke.  Echocardiogram was read as having moderately reduced ejection fraction with EF 35 to 40%.  Echo reviewed by myself, difficult study with probably normal function.  We will repeat echocardiogram with contrast if needed to evaluate  cardiac function.  If repeat echocardiogram shows reduced function, will plan for coronary CTA to evaluate CAD.  If repeat echocardiogram is normal, will adjust patient medications as appropriate. 2. hx of hypertension, blood pressure controlled.  Patient still very fatigued with recent medication adjustments.  Decrease hydralazine to 25 mg twice daily.  Continue Coreg, Entresto, spironolactone as prescribed. 3. History of hyperlipidemia, continue Lipitor as prescribed.  We will plan for repeat lipid panel in 3 months. 4. Patient is a current smoker, cessation advised, over 5 minutes spent counseling patient.  Smoking most likely contributed to stroke.  He has expiratory wheezing on my exam.  Pulmonary referral was recommended but patient would like to undergo current cardiac work-up prior to pulmonary referral.  Follow-up after echocardiogram.   Medication Adjustments/Labs and Tests Ordered: Current medicines are reviewed at length with the patient today.  Concerns regarding medicines are outlined above.  Orders Placed This Encounter  Procedures  . EKG 12-Lead  . ECHOCARDIOGRAM COMPLETE   Meds ordered this encounter  Medications  . hydrALAZINE (APRESOLINE) 25 MG tablet    Sig: Take 1 tablet (25 mg total) by mouth 2 (two) times daily.    Dispense:  60 tablet    Refill:  3    Patient Instructions    Medication Instructions:   Your physician has recommended you make the following change in your medication:   DECREASE your hydrALAZINE (APRESOLINE): Take 1 tablet (25 mg total) by mouth 2 (two) times daily  *If you need a refill on your cardiac medications before your next appointment, please call your pharmacy*   Lab Work: None Ordered If you have labs (blood work) drawn today and your tests are completely normal, you will receive your results only by: Marland Kitchen MyChart Message (if you have MyChart) OR . A paper copy in the mail If you have any lab test that is abnormal or we need to change your treatment, we will call you to review the results.   Testing/Procedures:  Your physician has requested that you have an echocardiogram. Echocardiography is a painless test that uses sound waves to create images of your heart. It provides your doctor with information about the size and shape of your heart and how well your heart's chambers and valves are working. This procedure takes approximately one hour. There are no restrictions for this procedure.     Follow-Up: At [facility], you and your health needs are our priority.  As part of our continuing mission to provide you with exceptional heart care, we have created designated Provider Care Teams.  These Care Teams include your primary Cardiologist (physician) and Advanced Practice Providers (APPs -  Physician Assistants and Nurse Practitioners) who all work together to provide you with the care you need, when you need it.  We recommend signing up for the patient portal called "MyChart".  Sign up information is provided on this After Visit Summary.  MyChart is used to connect with patients for Virtual Visits (Telemedicine).  Patients are able to view lab/test results, encounter notes, upcoming appointments, etc.  Non-urgent messages can be sent to your provider as well.   To learn more about what you can do with MyChart, go to  [email or web address].    Your next appointment:   Follow up after Echo   The format for your next appointment:   In Person  Provider:   Debbe Odea, MD   Other Instructions       Signed, Debbe Odea, MD  02/22/2020 5:05  PM    [facility]

## 2020-02-26 DIAGNOSIS — D6851 Activated protein C resistance: Secondary | ICD-10-CM | POA: Diagnosis not present

## 2020-02-26 DIAGNOSIS — I6381 Other cerebral infarction due to occlusion or stenosis of small artery: Secondary | ICD-10-CM | POA: Diagnosis not present

## 2020-02-26 DIAGNOSIS — I6350 Cerebral infarction due to unspecified occlusion or stenosis of unspecified cerebral artery: Secondary | ICD-10-CM | POA: Diagnosis not present

## 2020-03-04 ENCOUNTER — Other Ambulatory Visit: Payer: Self-pay

## 2020-03-04 ENCOUNTER — Inpatient Hospital Stay: Payer: 59 | Attending: Internal Medicine | Admitting: Internal Medicine

## 2020-03-04 ENCOUNTER — Encounter: Payer: Self-pay | Admitting: Internal Medicine

## 2020-03-04 DIAGNOSIS — Z8673 Personal history of transient ischemic attack (TIA), and cerebral infarction without residual deficits: Secondary | ICD-10-CM | POA: Insufficient documentation

## 2020-03-04 DIAGNOSIS — F17210 Nicotine dependence, cigarettes, uncomplicated: Secondary | ICD-10-CM | POA: Diagnosis not present

## 2020-03-04 DIAGNOSIS — Z7902 Long term (current) use of antithrombotics/antiplatelets: Secondary | ICD-10-CM | POA: Diagnosis not present

## 2020-03-04 DIAGNOSIS — Z79899 Other long term (current) drug therapy: Secondary | ICD-10-CM | POA: Diagnosis not present

## 2020-03-04 DIAGNOSIS — R69 Illness, unspecified: Secondary | ICD-10-CM | POA: Diagnosis not present

## 2020-03-04 DIAGNOSIS — D6851 Activated protein C resistance: Secondary | ICD-10-CM | POA: Insufficient documentation

## 2020-03-04 NOTE — Patient Instructions (Signed)
#   Talk to daughter/children re: Factor V heterozygous re: risk of blood clot.

## 2020-03-04 NOTE — Assessment & Plan Note (Addendum)
#   Factor V heterozygosity -diagnosed incidentally on work-up of stroke.  Factor V heterozygosity is usually associated with venous blood clots-DVT/PE.  Patient has multiple risk factors for acute stroke [arterial event]-poorly controlled blood pressure; hyperlipidemia; history of smoking.  Clinically I do not suspect patient's acute stroke was noted to factor V heterozygosity.  #I would recommend continued treatment with dual antiplatelet therapy [aspirin Plavix] as currently recommended by neurology.  I would not recommend anticoagulation with Eliquis or Xarelto at this time.  [Patient history of intracranial hemorrhage-secondary to poorly controlled blood pressure]  #Family screening: Patient has a daughter in her 35s; recommend informing the daughter regarding diagnosis of factor V Leiden heterozygosity.  I would recommend screening the daughter for factor V Leiden-given the risk of blood clots on BCP/pregnancies etc.   #Smoking: Recommend smoking cessation.  Understands the risk of continued smoking.  Thank you, Jodi Marble, PA-C for allowing me to participate in the care of your pleasant patient. Please do not hesitate to contact me with questions or concerns in the interim.  # 45 minutes face-to-face with the patient discussing the above plan of care; more than 50% of time spent on prognosis/ natural history; counseling and coordination.    # DISPOSITION:  # follow up as needed-Dr.B

## 2020-03-04 NOTE — Progress Notes (Signed)
[facility] CONSULT NOTE  Patient Care Team: Maryella Shivers as PCP - General (Physician Assistant) Debbe Odea, MD as PCP - Cardiology (Cardiology)  CHIEF COMPLAINTS/PURPOSE OF CONSULTATION: DVT/PE  # right sided CVA [july2021]- deficits resolved- on asprin/plavix; Work up-Factor V leiden heterozygosity  # CHF- EF- 40% Gwenlyn Perking [id number]; Dr.E]  # hemorraghic stroke [2017; [place]]- ? HTN; COPD  Oncology History   No history exists.     HISTORY OF PRESENTING ILLNESS:  Jon Yang 54 y.o.  male with prior medical history significant for hemorrhagic stroke 2017 hyperlipidemia hypertension COPD tobacco abuse; recent stroke has been referred to [place] for further evaluation recommendations for factor V Leiden heterozygosity.  Patient was recently admitted to hospital for stroke/left-sided with aphasic symptoms also weakness-MRI showed acute infarct in the pons.  Patient was discharged home on aspirin Plavix.  However hypercoagulable work-up showed factor V Leiden heterozygosity.  Patient has poorly controlled blood pressures and cholesterol.  Patient also noted to have low ejection fraction of approach 40% in the hospital.  Patient is recommended outpatient work-up  Patient is unclear however he thinks he might have had a clot in his lungs.  But never had been on any anticoagulation.   Review of Systems  Constitutional: Positive for malaise/fatigue. Negative for chills, diaphoresis, fever and weight loss.  HENT: Negative for nosebleeds and sore throat.   Eyes: Negative for double vision.  Respiratory: Negative for cough, hemoptysis, sputum production, shortness of breath and wheezing.   Cardiovascular: Negative for chest pain, palpitations, orthopnea and leg swelling.  Gastrointestinal: Negative for abdominal pain, blood in stool, constipation, diarrhea, heartburn, melena, nausea and vomiting.  Genitourinary: Negative for dysuria, frequency and urgency.   Musculoskeletal: Positive for back pain and joint pain.  Skin: Negative.  Negative for itching and rash.  Neurological: Negative for dizziness, tingling, focal weakness, weakness and headaches.  Endo/Heme/Allergies: Does not bruise/bleed easily.  Psychiatric/Behavioral: Negative for depression. The patient is not nervous/anxious and does not have insomnia.      MEDICAL HISTORY:  Past Medical History:  Diagnosis Date  . Hyperlipidemia   . Hypertension   . Stroke [facility]) 2017    SURGICAL HISTORY: Past Surgical History:  Procedure Laterality Date  . NO PAST SURGERIES      SOCIAL HISTORY: Social History   Socioeconomic History  . Marital status: Single    Spouse name: Not on file  . Number of children: Not on file  . Years of education: Not on file  . Highest education level: Not on file  Occupational History  . Not on file  Tobacco Use  . Smoking status: Current Every Day Smoker    Packs/day: 1.00  . Smokeless tobacco: Never Used  Vaping Use  . Vaping Use: Never used  Substance and Sexual Activity  . Alcohol use: No  . Drug use: No  . Sexual activity: Not on file  Other Topics Concern  . Not on file  Social History Narrative   Works for [employer] [place]; smoking- 1ppd [3-4 ppd]; no alcohol; lives in [place].    Social Determinants of Health   Financial Resource Strain:   . Difficulty of Paying Living Expenses:   Food Insecurity:   . Worried About Programme researcher, broadcasting/film/video in the Last Year:   . Barista in the Last Year:   Transportation Needs:   . Freight forwarder (Medical):   Marland Kitchen Lack of Transportation (Non-Medical):   Physical Activity:   .  Days of Exercise per Week:   . Minutes of Exercise per Session:   Stress:   . Feeling of Stress :   Social Connections:   . Frequency of Communication with Friends and Family:   . Frequency of Social Gatherings with Friends and Family:   . Attends Religious Services:   . Active Member of Clubs or  Organizations:   . Attends Banker Meetings:   Marland Kitchen Marital Status:   Intimate Partner Violence:   . Fear of Current or Ex-Partner:   . Emotionally Abused:   Marland Kitchen Physically Abused:   . Sexually Abused:     FAMILY HISTORY: Family History  Problem Relation Age of Onset  . Healthy Mother   . Lung cancer Father   . Prostate cancer Father   . Healthy Sister   . Hypertension Brother   . Healthy Sister     ALLERGIES:  is allergic to shellfish allergy.  MEDICATIONS:  Current Outpatient Medications  Medication Sig Dispense Refill  . albuterol (VENTOLIN HFA) 108 (90 Base) MCG/ACT inhaler Inhale 2 puffs into the lungs every 6 (six) hours as needed for wheezing or shortness of breath. 8 g 3  . aspirin EC 81 MG EC tablet Take 1 tablet (81 mg total) by mouth daily. Swallow whole. 30 tablet 11  . atorvastatin (LIPITOR) 40 MG tablet Take 1 tablet (40 mg total) by mouth daily. 30 tablet 11  . carvedilol (COREG) 12.5 MG tablet Take 1 tablet (12.5 mg total) by mouth 2 (two) times daily with a meal. 60 tablet 0  . clopidogrel (PLAVIX) 75 MG tablet Take 1 tablet (75 mg total) by mouth daily. 30 tablet 0  . fluticasone (FLONASE) 50 MCG/ACT nasal spray Place 1 spray into both nostrils 2 (two) times daily.  0  . Fluticasone-Umeclidin-Vilant (TRELEGY ELLIPTA) 100-62.5-25 MCG/INH AEPB Inhale 1 puff into the lungs daily as needed.    . hydrALAZINE (APRESOLINE) 25 MG tablet Take 1 tablet (25 mg total) by mouth 2 (two) times daily. 60 tablet 3  . ipratropium-albuterol (DUONEB) 0.5-2.5 (3) MG/3ML SOLN Inhale 3 mLs into the lungs 4 (four) times daily as needed. 360 mL 5  . sacubitril-valsartan (ENTRESTO) 24-26 MG Take 1 tablet by mouth 2 (two) times daily. 60 tablet 0  . Spacer/Aero-Holding Chambers (OPTICHAMBER DIAMOND-SM MASK) MISC Use with inhalers.    Marland Kitchen spironolactone (ALDACTONE) 25 MG tablet Take 0.5 tablets (12.5 mg total) by mouth daily. 30 tablet 0  . TRELEGY ELLIPTA 100-62.5-25 MCG/INH AEPB  INHALE 1 PUFF BY MOUTH EVERY DAY 60 each 2  . amLODipine (NORVASC) 10 MG tablet TAKE 1 TABLET BY MOUTH EVERY DAY (Patient not taking: Reported on 03/04/2020) 90 tablet 0  . cholecalciferol (VITAMIN D) 1000 units tablet Take 5,000 Units by mouth daily. (Patient not taking: Reported on 02/10/2020)    . nicotine (NICODERM CQ - DOSED IN MG/24 HOURS) 21 mg/24hr patch Place 1 patch (21 mg total) onto the skin daily. (Patient not taking: Reported on 02/18/2020) 28 patch 0  . SUPER B COMPLEX/C PO Take 1 tablet by mouth 2 (two) times daily. (Patient not taking: Reported on 02/10/2020)     No current facility-administered medications for this visit.      Marland Kitchen  PHYSICAL EXAMINATION:  Vitals:   03/04/20 1354  BP: 116/68  Pulse: 73  Resp: 18  Temp: 99.2 F (37.3 C)  SpO2: 100%   Filed Weights   03/04/20 1354  Weight: 232 lb (105.2 kg)    Physical  Exam HENT:     Head: Normocephalic and atraumatic.     Mouth/Throat:     Pharynx: No oropharyngeal exudate.  Eyes:     Pupils: Pupils are equal, round, and reactive to light.  Cardiovascular:     Rate and Rhythm: Normal rate and regular rhythm.  Pulmonary:     Effort: No respiratory distress.     Breath sounds: No wheezing.     Comments: Decreased air entry bilaterally. Abdominal:     General: Bowel sounds are normal. There is no distension.     Palpations: Abdomen is soft. There is no mass.     Tenderness: There is no abdominal tenderness. There is no guarding or rebound.  Musculoskeletal:        General: No tenderness. Normal range of motion.     Cervical back: Normal range of motion and neck supple.  Skin:    General: Skin is warm.  Neurological:     Mental Status: He is alert and oriented to person, place, and time.  Psychiatric:        Mood and Affect: Affect normal.      LABORATORY DATA:  I have reviewed the data as listed Lab Results  Component Value Date   WBC 10.2 02/10/2020   HGB 18.9 (H) 02/10/2020   HCT 53.1 (H)  02/10/2020   MCV 89.2 02/10/2020   PLT 232 02/10/2020   Recent Labs    03/06/19 0855 10/25/19 1055 02/10/20 1007  NA 139 136 137  K 4.1 4.5 3.9  CL 103 100 107  CO2 GLUCOSE 77 80 108*  BUN CREATININE 1.07 1.08 0.97  CALCIUM 8.8 9.7 9.0  GFRNONAA 79 78 >60  GFRAA 92 90 >60  PROT 6.9 7.3 7.8  ALBUMIN 4.3 4.3 4.1  AST ALT ALKPHOS 111 129* 90  BILITOT 1.5* 0.9 1.7*    RADIOGRAPHIC STUDIES: I have personally reviewed the radiological images as listed and agreed with the findings in the report. CT Angio Head W or Wo Contrast  Result Date: 02/10/2020 CLINICAL DATA:  Right-sided weakness and numbness.  Slurred speech. EXAM: CT ANGIOGRAPHY HEAD AND NECK CT PERFUSION BRAIN TECHNIQUE: Multidetector CT imaging of the head and neck was performed using the standard protocol during bolus administration of intravenous contrast. Multiplanar CT image reconstructions and MIPs were obtained to evaluate the vascular anatomy. Carotid stenosis measurements (when applicable) are obtained utilizing NASCET criteria, using the distal internal carotid diameter as the denominator. Multiphase CT imaging of the brain was performed following IV bolus contrast injection. Subsequent parametric perfusion maps were calculated using RAPID software. CONTRAST:  OMNIPAQUE IOHEXOL 350 MG/ML SOLN COMPARISON:  CT head 02/10/2020 FINDINGS: CTA NECK FINDINGS Aortic arch: Standard branching. Imaged portion shows no evidence of aneurysm or dissection. No significant stenosis of the major arch vessel origins. Right carotid system: Right carotid widely patent without stenosis. Left carotid system: Left carotid widely patent without stenosis. Vertebral arteries: Both vertebral arteries widely patent to the basilar without stenosis. Skeleton: No acute skeletal abnormality. Other neck: Negative for mass or adenopathy. Upper chest: Lung apices clear bilaterally. Review of the MIP images  confirms the above findings CTA HEAD FINDINGS Anterior circulation: Cavernous carotid patent bilaterally without stenosis. Anterior and middle cerebral arteries patent bilaterally without stenosis or large vessel occlusion. Posterior circulation: Both vertebral arteries patent to the basilar without stenosis. PICA patent bilaterally. AICA patent bilaterally. Basilar widely  patent. Superior cerebellar and posterior cerebral arteries patent bilaterally without stenosis. Venous sinuses: Normal venous enhancement. Anatomic variants: None Review of the MIP images confirms the above findings CT Brain Perfusion Findings: ASPECTS: 10 CBF (<30%) Volume: 58mL Perfusion (Tmax>6.0s) volume: 1mL Mismatch Volume: 64mL Infarction Location:None IMPRESSION: 1. CT perfusion negative for acute infarct or ischemia. 2. No carotid or vertebral artery stenosis on in the neck. 3. Negative for intracranial stenosis or large vessel occlusion. Electronically Signed   By: Marlan Palau M.D.   On: 02/10/2020 12:07   CT HEAD WO CONTRAST  Result Date: 02/10/2020 CLINICAL DATA:  Pt to ED via POV stating that he woke up this morning with at 0300 with slurred speech and right sided numbness and weakness. Pt states Pt states that he has hx/o brain bleed in 2017 that affected the left side. Pt unable to hold.*comment was truncated* EXAM: CT HEAD WITHOUT CONTRAST TECHNIQUE: Contiguous axial images were obtained from the base of the skull through the vertex without intravenous contrast. COMPARISON:  CT head 03/27/2017 FINDINGS: Brain: No evidence of acute infarction, hemorrhage, hydrocephalus, extra-axial collection or mass lesion/mass effect. Vascular: No hyperdense vessel or unexpected calcification. Skull: Normal. Negative for fracture or focal lesion. Sinuses/Orbits: Partial opacification of the right frontal sinus. Otherwise sinuses are clear. Normal appearance of the orbits. Other: None. IMPRESSION: 1. No acute intracranial findings. 2. Right  frontal sinus disease. Electronically Signed   By: Emmaline Kluver M.D.   On: 02/10/2020 11:02   CT Angio Neck W and/or Wo Contrast  Result Date: 02/10/2020 CLINICAL DATA:  Right-sided weakness and numbness.  Slurred speech. EXAM: CT ANGIOGRAPHY HEAD AND NECK CT PERFUSION BRAIN TECHNIQUE: Multidetector CT imaging of the head and neck was performed using the standard protocol during bolus administration of intravenous contrast. Multiplanar CT image reconstructions and MIPs were obtained to evaluate the vascular anatomy. Carotid stenosis measurements (when applicable) are obtained utilizing NASCET criteria, using the distal internal carotid diameter as the denominator. Multiphase CT imaging of the brain was performed following IV bolus contrast injection. Subsequent parametric perfusion maps were calculated using RAPID software. CONTRAST:  OMNIPAQUE IOHEXOL 350 MG/ML SOLN COMPARISON:  CT head 02/10/2020 FINDINGS: CTA NECK FINDINGS Aortic arch: Standard branching. Imaged portion shows no evidence of aneurysm or dissection. No significant stenosis of the major arch vessel origins. Right carotid system: Right carotid widely patent without stenosis. Left carotid system: Left carotid widely patent without stenosis. Vertebral arteries: Both vertebral arteries widely patent to the basilar without stenosis. Skeleton: No acute skeletal abnormality. Other neck: Negative for mass or adenopathy. Upper chest: Lung apices clear bilaterally. Review of the MIP images confirms the above findings CTA HEAD FINDINGS Anterior circulation: Cavernous carotid patent bilaterally without stenosis. Anterior and middle cerebral arteries patent bilaterally without stenosis or large vessel occlusion. Posterior circulation: Both vertebral arteries patent to the basilar without stenosis. PICA patent bilaterally. AICA patent bilaterally. Basilar widely patent. Superior cerebellar and posterior cerebral arteries patent bilaterally without  stenosis. Venous sinuses: Normal venous enhancement. Anatomic variants: None Review of the MIP images confirms the above findings CT Brain Perfusion Findings: ASPECTS: 10 CBF (<30%) Volume: 36mL Perfusion (Tmax>6.0s) volume: 71mL Mismatch Volume: 83mL Infarction Location:None IMPRESSION: 1. CT perfusion negative for acute infarct or ischemia. 2. No carotid or vertebral artery stenosis on in the neck. 3. Negative for intracranial stenosis or large vessel occlusion. Electronically Signed   By: Marlan Palau M.D.   On: 02/10/2020 12:07   MR BRAIN WO CONTRAST  Result  Date: 02/10/2020 CLINICAL DATA:  Right-sided weakness, resolved EXAM: MRI HEAD WITHOUT CONTRAST TECHNIQUE: Multiplanar, multiecho pulse sequences of the brain and surrounding structures were obtained without intravenous contrast. COMPARISON:  2018 FINDINGS: Brain: There is an 8 mm focus of reduced diffusion at the left aspect of the pons. There is no intracranial mass, mass effect, or edema. There is no hydrocephalus or extra-axial fluid collection. Ventricles and sulci are within normal limits in size and configuration. There is a chronic hemorrhagic small vessel infarct of the right basal ganglia and adjacent white matter. Few small foci of T2 hyperintensity in the supratentorial white matter are nonspecific but may reflect minor chronic microvascular ischemic changes. Vascular: Major vessel flow voids at the skull base are preserved. Skull and upper cervical spine: Normal marrow signal is preserved. Sinuses/Orbits: Mild mucosal thickening.  Orbits are unremarkable. Other: Sella is unremarkable.  Mastoid air cells are clear. IMPRESSION: Small acute infarct of the left pons. Stable chronic findings detailed above. Electronically Signed   By: Guadlupe Spanish M.D.   On: 02/10/2020 18:30   CT CEREBRAL PERFUSION W CONTRAST  Result Date: 02/10/2020 CLINICAL DATA:  Right-sided weakness and numbness.  Slurred speech. EXAM: CT ANGIOGRAPHY HEAD AND NECK CT  PERFUSION BRAIN TECHNIQUE: Multidetector CT imaging of the head and neck was performed using the standard protocol during bolus administration of intravenous contrast. Multiplanar CT image reconstructions and MIPs were obtained to evaluate the vascular anatomy. Carotid stenosis measurements (when applicable) are obtained utilizing NASCET criteria, using the distal internal carotid diameter as the denominator. Multiphase CT imaging of the brain was performed following IV bolus contrast injection. Subsequent parametric perfusion maps were calculated using RAPID software. CONTRAST:  OMNIPAQUE IOHEXOL 350 MG/ML SOLN COMPARISON:  CT head 02/10/2020 FINDINGS: CTA NECK FINDINGS Aortic arch: Standard branching. Imaged portion shows no evidence of aneurysm or dissection. No significant stenosis of the major arch vessel origins. Right carotid system: Right carotid widely patent without stenosis. Left carotid system: Left carotid widely patent without stenosis. Vertebral arteries: Both vertebral arteries widely patent to the basilar without stenosis. Skeleton: No acute skeletal abnormality. Other neck: Negative for mass or adenopathy. Upper chest: Lung apices clear bilaterally. Review of the MIP images confirms the above findings CTA HEAD FINDINGS Anterior circulation: Cavernous carotid patent bilaterally without stenosis. Anterior and middle cerebral arteries patent bilaterally without stenosis or large vessel occlusion. Posterior circulation: Both vertebral arteries patent to the basilar without stenosis. PICA patent bilaterally. AICA patent bilaterally. Basilar widely patent. Superior cerebellar and posterior cerebral arteries patent bilaterally without stenosis. Venous sinuses: Normal venous enhancement. Anatomic variants: None Review of the MIP images confirms the above findings CT Brain Perfusion Findings: ASPECTS: 10 CBF (<30%) Volume: 36mL Perfusion (Tmax>6.0s) volume: 46mL Mismatch Volume: 3mL Infarction  Location:None IMPRESSION: 1. CT perfusion negative for acute infarct or ischemia. 2. No carotid or vertebral artery stenosis on in the neck. 3. Negative for intracranial stenosis or large vessel occlusion. Electronically Signed   By: Marlan Palau M.D.   On: 02/10/2020 12:07   ECHOCARDIOGRAM COMPLETE  Result Date: 02/10/2020    ECHOCARDIOGRAM REPORT   Patient Name:   Jon Yang Date of Exam: 02/10/2020 Medical Rec #:  [id number]   Height:       70.0 in Accession #:    [id number]  Weight:       235.0 lb Date of Birth:  05/14/1966  BSA:          2.235 m Patient Age:    6  years    BP:           173/94 mmHg Patient Gender: M           HR:           81 bpm. Exam Location:  [facility] Procedure: 2D Echo, Color Doppler and Cardiac Doppler Indications:     Stroke 434.91  History:         Patient has no prior history of Echocardiogram examinations.                  Stroke; Risk Factors:Hypertension.  Sonographer:     Cristela BlueJerry Hege RDCS (AE) Referring Phys:  Kern [id number] Brien FewXILIN NIU Diagnosing Phys: Julien Nordmannimothy Gollan MD  Sonographer Comments: No apical window and no parasternal window. Image acquisition challenging due to COPD. IMPRESSIONS  1. Left ventricular ejection fraction, by estimation, is 35 to 40%. The left ventricle has moderately decreased function. The left ventricle demonstrates regional wall motion abnormalities (Hypokinesis of the basal inferior, lateral, anterior and anteroseptal regions). Left ventricular diastolic parameters are indeterminate.  2. Right ventricular systolic function is normal. The right ventricular size is normal. Tricuspid regurgitation signal is inadequate for assessing PA pressure.  3. Challenging images. FINDINGS  Left Ventricle: Left ventricular ejection fraction, by estimation, is 35 to 40%. The left ventricle has moderately decreased function. The left ventricle demonstrates regional wall motion abnormalities. The left ventricular internal cavity size was normal in size. There is no left ventricular  hypertrophy. Left ventricular diastolic parameters are indeterminate. Right Ventricle: The right ventricular size is normal. No increase in right ventricular wall thickness. Right ventricular systolic function is normal. Tricuspid regurgitation signal is inadequate for assessing PA pressure. Left Atrium: Left atrial size was normal in size. Right Atrium: Right atrial size was normal in size. Pericardium: There is no evidence of pericardial effusion. Mitral Valve: The mitral valve was not well visualized. Normal mobility of the mitral valve leaflets. No evidence of mitral valve regurgitation. No evidence of mitral valve stenosis. Tricuspid Valve: The tricuspid valve is not well visualized. Tricuspid valve regurgitation is not demonstrated. No evidence of tricuspid stenosis. Aortic Valve: The aortic valve was not well visualized. Aortic valve regurgitation is not visualized. No aortic stenosis is present. Pulmonic Valve: The pulmonic valve was not well visualized. Pulmonic valve regurgitation is not visualized. No evidence of pulmonic stenosis. Aorta: The aortic root is normal in size and structure. Venous: The inferior vena cava is normal in size with greater than 50% respiratory variability, suggesting right atrial pressure of 3 mmHg. IAS/Shunts: No atrial level shunt detected by color flow Doppler.  LEFT VENTRICLE PLAX 2D LVIDd:         3.39 cm LVIDs:         2.84 cm LV PW:         1.24 cm LV IVS:        1.37 cm  LEFT ATRIUM         Index LA diam:    4.70 cm 2.10 cm/m  PULMONIC VALVE PV Vmax:        1.06 m/s PV Peak grad:   4.5 mmHg RVOT Peak grad: 5 mmHg  Julien Nordmannimothy Gollan MD Electronically signed by Julien Nordmannimothy Gollan MD Signature Date/Time: 02/10/2020/4:26:30 PM    Final     ASSESSMENT & PLAN:   Heterozygous factor V Leiden mutation (HCC) # Factor V heterozygosity -diagnosed incidentally on work-up of stroke.  Factor V heterozygosity is usually associated with venous blood clots-DVT/PE.  Patient has  multiple risk  factors for acute stroke [arterial event]-poorly controlled blood pressure; hyperlipidemia; history of smoking.  Clinically I do not suspect patient's acute stroke was noted to factor V heterozygosity.  #I would recommend continued treatment with dual antiplatelet therapy [aspirin Plavix] as currently recommended by neurology.  I would not recommend anticoagulation with Eliquis or Xarelto at this time.  [Patient history of intracranial hemorrhage-secondary to poorly controlled blood pressure]  #Family screening: Patient has a daughter in her 69s; recommend informing the daughter regarding diagnosis of factor V Leiden heterozygosity.  I would recommend screening the daughter for factor V Leiden-given the risk of blood clots on BCP/pregnancies etc.   #Smoking: Recommend smoking cessation.  Understands the risk of continued smoking.  Thank you, Jodi Marble, PA-C for allowing me to participate in the care of your pleasant patient. Please do not hesitate to contact me with questions or concerns in the interim.  # 45 minutes face-to-face with the patient discussing the above plan of care; more than 50% of time spent on prognosis/ natural history; counseling and coordination.    # DISPOSITION:  # follow up as needed-Dr.B    All questions were answered. The patient knows to call the clinic with any problems, questions or concerns.       Earna Coder, MD 03/04/2020 3:59 PM

## 2020-03-12 ENCOUNTER — Other Ambulatory Visit: Payer: Self-pay | Admitting: Cardiology

## 2020-03-12 DIAGNOSIS — I429 Cardiomyopathy, unspecified: Secondary | ICD-10-CM

## 2020-03-12 DIAGNOSIS — R931 Abnormal findings on diagnostic imaging of heart and coronary circulation: Secondary | ICD-10-CM

## 2020-03-19 ENCOUNTER — Other Ambulatory Visit: Payer: Self-pay | Admitting: Physician Assistant

## 2020-03-19 DIAGNOSIS — I639 Cerebral infarction, unspecified: Secondary | ICD-10-CM

## 2020-03-20 ENCOUNTER — Other Ambulatory Visit: Payer: Self-pay

## 2020-03-20 ENCOUNTER — Ambulatory Visit (INDEPENDENT_AMBULATORY_CARE_PROVIDER_SITE_OTHER): Payer: 59

## 2020-03-20 DIAGNOSIS — I429 Cardiomyopathy, unspecified: Secondary | ICD-10-CM

## 2020-03-20 LAB — ECHOCARDIOGRAM COMPLETE
AR max vel: 2.7 cm2
AV Area VTI: 2.85 cm2
AV Area mean vel: 2.76 cm2
AV Mean grad: 5 mmHg
AV Peak grad: 9.7 mmHg
Ao pk vel: 1.56 m/s
Area-P 1/2: 3.77 cm2
Calc EF: 55 %
Single Plane A2C EF: 53.8 %
Single Plane A4C EF: 55.4 %

## 2020-03-20 MED ORDER — PERFLUTREN LIPID MICROSPHERE
1.0000 mL | INTRAVENOUS | Status: AC | PRN
  Administered 2020-03-20: 2 mL via INTRAVENOUS

## 2020-03-24 ENCOUNTER — Other Ambulatory Visit: Payer: Self-pay | Admitting: Physician Assistant

## 2020-03-24 DIAGNOSIS — J449 Chronic obstructive pulmonary disease, unspecified: Secondary | ICD-10-CM

## 2020-03-24 MED ORDER — TRELEGY ELLIPTA 100-62.5-25 MCG/INH IN AEPB: INHALATION_SPRAY | RESPIRATORY_TRACT | 2 refills | Status: DC

## 2020-03-24 NOTE — Telephone Encounter (Signed)
Medication Refill - Medication: Trelegy  Has the patient contacted their pharmacy? Yes.   (Agent: If no, request that the patient contact the pharmacy for the refill.) (Agent: If yes, when and what did the pharmacy advise?)  Preferred Pharmacy (with phone number or street name): [id number] [place], [place] - [address]  Agent: Please be advised that RX refills may take up to 3 business days. We ask that you follow-up with your pharmacy.

## 2020-03-24 NOTE — Telephone Encounter (Signed)
Please advise refill? Looks like two different providers have sent in refills for Trelegy.

## 2020-03-28 ENCOUNTER — Ambulatory Visit (INDEPENDENT_AMBULATORY_CARE_PROVIDER_SITE_OTHER): Payer: 59 | Admitting: Cardiology

## 2020-03-28 ENCOUNTER — Encounter: Payer: Self-pay | Admitting: Cardiology

## 2020-03-28 ENCOUNTER — Other Ambulatory Visit: Payer: Self-pay

## 2020-03-28 VITALS — BP 120/90 | HR 64 | Ht 69.0 in | Wt 228.5 lb

## 2020-03-28 DIAGNOSIS — R931 Abnormal findings on diagnostic imaging of heart and coronary circulation: Secondary | ICD-10-CM | POA: Diagnosis not present

## 2020-03-28 DIAGNOSIS — IMO0001 Reserved for inherently not codable concepts without codable children: Secondary | ICD-10-CM

## 2020-03-28 DIAGNOSIS — R69 Illness, unspecified: Secondary | ICD-10-CM | POA: Diagnosis not present

## 2020-03-28 DIAGNOSIS — I10 Essential (primary) hypertension: Secondary | ICD-10-CM | POA: Diagnosis not present

## 2020-03-28 DIAGNOSIS — F17200 Nicotine dependence, unspecified, uncomplicated: Secondary | ICD-10-CM | POA: Diagnosis not present

## 2020-03-28 DIAGNOSIS — E7800 Pure hypercholesterolemia, unspecified: Secondary | ICD-10-CM

## 2020-03-28 MED ORDER — CARVEDILOL 12.5 MG PO TABS: 12.5000 mg | ORAL_TABLET | Freq: Two times a day (BID) | ORAL | 5 refills | Status: DC

## 2020-03-28 MED ORDER — LOSARTAN POTASSIUM 50 MG PO TABS
50.0000 mg | ORAL_TABLET | Freq: Two times a day (BID) | ORAL | 5 refills | Status: DC
Start: 2020-03-28 — End: 2020-09-04

## 2020-03-28 NOTE — Progress Notes (Signed)
Cardiology Office Note:    Date:  03/28/2020   ID:  Rolf Fells, DOB 03/27/1966, MRN [id number]  PCP:  Trey Sailors, PA-C  [facility] Cardiologist:  Debbe Odea, MD  [facility] Electrophysiologist:  None   Referring MD: Trey Sailors, PA-C   Chief Complaint  Patient presents with  . other    Follow up Echo. Meds reviewed by the pt.'s bottles. Pt. c/o shortness of breath with over exertion.     History of Present Illness:    Troi Florendo is a 54 y.o. male with a hx of hypertension, hyperlipidemia, COPD, current smoker x 40+yrs, hemorrhagic stroke 2017,  Ischemic CVA 01/2020 who presents for follow-up.  Last seen to establish care after being hospitalized for stroke.  Echocardiogram was noted to be moderately reduced.  Repeat echocardiogram ordered to confirm ejection fraction.  Patient started on Coreg Entresto and Aldactone.  Hydralazine was decreased because patient was fatigued.  Patient still feels very fatigued and tired during the day.  Started on inhalers for COPD but takes it infrequently.  Prior notes Patient was seen on 02/11/2020 due to difficulty speaking and right-sided weakness including right facial droop right upper extremity weakness and weakness in the right lower extremity.  Presented to the ED where head CT did not show any acute change, MRI showed small left pontine infarct.  CTA neck without sick significant stenosis.  Echocardiogram in the hospital had reading of moderately reduced ejection fraction, EF 35 to 40%.  Patient denied chest pain.  Ischemic work-up was recommended as outpatient.  Patient started on Coreg, Entresto, Aldactone prior to discharge. .  Past Medical History:  Diagnosis Date  . Hyperlipidemia   . Hypertension   . Stroke [facility]) 2017    Past Surgical History:  Procedure Laterality Date  . NO PAST SURGERIES      Current Medications: Current Meds  Medication Sig  . albuterol (VENTOLIN HFA) 108 (90 Base) MCG/ACT  inhaler Inhale 2 puffs into the lungs every 6 (six) hours as needed for wheezing or shortness of breath.  Marland Kitchen aspirin EC 81 MG EC tablet Take 1 tablet (81 mg total) by mouth daily. Swallow whole.  Marland Kitchen atorvastatin (LIPITOR) 40 MG tablet Take 1 tablet (40 mg total) by mouth daily.  . carvedilol (COREG) 12.5 MG tablet Take 1 tablet (12.5 mg total) by mouth 2 (two) times daily with a meal.  . clopidogrel (PLAVIX) 75 MG tablet TAKE 1 TABLET BY MOUTH EVERY DAY  . fluticasone (FLONASE) 50 MCG/ACT nasal spray Place 1 spray into both nostrils 2 (two) times daily.  . Fluticasone-Umeclidin-Vilant (TRELEGY ELLIPTA) 100-62.5-25 MCG/INH AEPB INHALE 1 PUFF BY MOUTH EVERY DAY  . ipratropium-albuterol (DUONEB) 0.5-2.5 (3) MG/3ML SOLN Inhale 3 mLs into the lungs 4 (four) times daily as needed.  Marland Kitchen Spacer/Aero-Holding Chambers (OPTICHAMBER DIAMOND-SM MASK) MISC Use with inhalers.  . [DISCONTINUED] carvedilol (COREG) 12.5 MG tablet Take 1 tablet (12.5 mg total) by mouth 2 (two) times daily with a meal.  . [DISCONTINUED] hydrALAZINE (APRESOLINE) 25 MG tablet Take 1 tablet (25 mg total) by mouth 2 (two) times daily.  . [DISCONTINUED] sacubitril-valsartan (ENTRESTO) 24-26 MG Take 1 tablet by mouth 2 (two) times daily.  . [DISCONTINUED] spironolactone (ALDACTONE) 25 MG tablet Take 0.5 tablets (12.5 mg total) by mouth daily.     Allergies:   Shellfish allergy   Social History   Socioeconomic History  . Marital status: Single    Spouse name: Not on file  . Number of  children: Not on file  . Years of education: Not on file  . Highest education level: Not on file  Occupational History  . Not on file  Tobacco Use  . Smoking status: Current Every Day Smoker    Packs/day: 1.00  . Smokeless tobacco: Never Used  Vaping Use  . Vaping Use: Never used  Substance and Sexual Activity  . Alcohol use: No  . Drug use: No  . Sexual activity: Not on file  Other Topics Concern  . Not on file  Social History Narrative    Works for [employer]; smoking- 1ppd [3-4 ppd]; no alcohol; lives in [place].    Social Determinants of Health   Financial Resource Strain:   . Difficulty of Paying Living Expenses: Not on file  Food Insecurity:   . Worried About Programme researcher, broadcasting/film/videounning Out of Food in the Last Year: Not on file  . Ran Out of Food in the Last Year: Not on file  Transportation Needs:   . Lack of Transportation (Medical): Not on file  . Lack of Transportation (Non-Medical): Not on file  Physical Activity:   . Days of Exercise per Week: Not on file  . Minutes of Exercise per Session: Not on file  Stress:   . Feeling of Stress : Not on file  Social Connections:   . Frequency of Communication with Friends and Family: Not on file  . Frequency of Social Gatherings with Friends and Family: Not on file  . Attends Religious Services: Not on file  . Active Member of Clubs or Organizations: Not on file  . Attends BankerClub or Organization Meetings: Not on file  . Marital Status: Not on file     Family History: The patient's family history includes Healthy in his mother, sister, and sister; Hypertension in his brother; Lung cancer in his father; Prostate cancer in his father.  ROS:   Please see the history of present illness.     All other systems reviewed and are negative.  EKGs/Labs/Other Studies Reviewed:    The following studies were reviewed today:   EKG:  EKG is  ordered today.  The ekg ordered today demonstrates normal sinus rhythm, normal ECG.  Recent Labs: 02/10/2020: ALT 28; BUN 11; Creatinine, Ser 0.97; Hemoglobin 18.9; Platelets 232; Potassium 3.9; Sodium 137  Recent Lipid Panel    Component Value Date/Time   CHOL 233 (H) 02/11/2020 0349   CHOL 224 (H) 03/06/2019 0855   TRIG 169 (H) 02/11/2020 0349   HDL 33 (L) 02/11/2020 0349   HDL 37 (L) 03/06/2019 0855   CHOLHDL 7.1 02/11/2020 0349   VLDL 34 02/11/2020 0349   LDLCALC 166 (H) 02/11/2020 0349   LDLCALC 155 (H) 03/06/2019 0855    Physical  Exam:    VS:  BP 120/90 (BP Location: Left Arm, Patient Position: Sitting, Cuff Size: Large)   Pulse 64   Ht 5\' 9"  (1.753 m)   Wt 228 lb 8 oz (103.6 kg)   SpO2 97%   BMI 33.74 kg/m     Wt Readings from Last 3 Encounters:  03/28/20 228 lb 8 oz (103.6 kg)  03/04/20 232 lb (105.2 kg)  02/22/20 (!) 230 lb 8 oz (104.6 kg)     GEN:  Well nourished, well developed in no acute distress HEENT: Normal NECK: No JVD; No carotid bruits LYMPHATICS: No lymphadenopathy CARDIAC: RRR, no murmurs, rubs, gallops RESPIRATORY: Expiratory wheezing noted ABDOMEN: Soft, non-tender, non-distended MUSCULOSKELETAL:  No edema; No deformity  SKIN: Warm and  dry NEUROLOGIC:  Alert and oriented x 3 PSYCHIATRIC:  Normal affect   ASSESSMENT:    1. Abnormal echocardiogram   2. Essential hypertension   3. Pure hypercholesterolemia   4. Smoking    PLAN:    In order of problems listed above:  1. Patient with recent hospitalization for stroke, echo noted to have moderately reduced ejection fraction.  Repeat echocardiogram in the office showed low normal ejection fraction, EF 50 to 55%.  Patient reassured and made aware of results. 2. hx of hypertension, blood pressure controlled.  Since patient does not have heart failure, will make medication adjustments to manage hypertension.  Sherryll Burger is more costly than other guideline directed medical therapy for hypertension.  Stop hydralazine, stop Entresto, stop spironolactone.  Continue Coreg 12.5 mg twice daily, start losartan 50 mg twice daily.  If at follow-up visit, blood pressure still elevated, plan to start diuretic/HCTZ. 3. History of hyperlipidemia, continue Lipitor 40 mg daily. 4. Patient is a current smoker, cessation again advised.  Advised to take inhalers as prescribed per primary care provider.  Follow-up in 1 month.  Total encounter time more than 40 minutes  Greater than 50% was spent in counseling and coordination of care with the  patient    Medication Adjustments/Labs and Tests Ordered: Current medicines are reviewed at length with the patient today.  Concerns regarding medicines are outlined above.  Orders Placed This Encounter  Procedures  . EKG 12-Lead   Meds ordered this encounter  Medications  . carvedilol (COREG) 12.5 MG tablet    Sig: Take 1 tablet (12.5 mg total) by mouth 2 (two) times daily with a meal.    Dispense:  60 tablet    Refill:  5  . losartan (COZAAR) 50 MG tablet    Sig: Take 1 tablet (50 mg total) by mouth in the morning and at bedtime.    Dispense:  60 tablet    Refill:  5    Patient Instructions  Medication Instructions:  Your physician has recommended you make the following change in your medication:  1) STOP Entresto 2) STOP Hydralazine 3) STOP Spironolactone  4) START Losartan 50 mg twice daily. An Rx has been sent to your pharmacy.  Carvedilol has been refilled today.  *If you need a refill on your cardiac medications before your next appointment, please call your pharmacy*   Lab Work: None ordered If you have labs (blood work) drawn today and your tests are completely normal, you will receive your results only by: Marland Kitchen MyChart Message (if you have MyChart) OR . A paper copy in the mail If you have any lab test that is abnormal or we need to change your treatment, we will call you to review the results.   Testing/Procedures: None ordered   Follow-Up: At [facility], you and your health needs are our priority.  As part of our continuing mission to provide you with exceptional heart care, we have created designated Provider Care Teams.  These Care Teams include your primary Cardiologist (physician) and Advanced Practice Providers (APPs -  Physician Assistants and Nurse Practitioners) who all work together to provide you with the care you need, when you need it.  We recommend signing up for the patient portal called "MyChart".  Sign up information is provided on this  After Visit Summary.  MyChart is used to connect with patients for Virtual Visits (Telemedicine).  Patients are able to view lab/test results, encounter notes, upcoming appointments, etc.  Non-urgent messages can  be sent to your provider as well.   To learn more about what you can do with MyChart, go to [email or web address].    Your next appointment:   4 week(s)  The format for your next appointment:   In Person  Provider:   Debbe Odea, MD   Other Instructions N/A     Signed, Debbe Odea, MD  03/28/2020 5:07 PM    [facility]

## 2020-03-28 NOTE — Patient Instructions (Signed)
Medication Instructions:  Your physician has recommended you make the following change in your medication:  1) STOP Entresto 2) STOP Hydralazine 3) STOP Spironolactone  4) START Losartan 50 mg twice daily. An Rx has been sent to your pharmacy.  Carvedilol has been refilled today.  *If you need a refill on your cardiac medications before your next appointment, please call your pharmacy*   Lab Work: None ordered If you have labs (blood work) drawn today and your tests are completely normal, you will receive your results only by:  MyChart Message (if you have MyChart) OR  A paper copy in the mail If you have any lab test that is abnormal or we need to change your treatment, we will call you to review the results.   Testing/Procedures: None ordered   Follow-Up: At [facility], you and your health needs are our priority.  As part of our continuing mission to provide you with exceptional heart care, we have created designated Provider Care Teams.  These Care Teams include your primary Cardiologist (physician) and Advanced Practice Providers (APPs -  Physician Assistants and Nurse Practitioners) who all work together to provide you with the care you need, when you need it.  We recommend signing up for the patient portal called "MyChart".  Sign up information is provided on this After Visit Summary.  MyChart is used to connect with patients for Virtual Visits (Telemedicine).  Patients are able to view lab/test results, encounter notes, upcoming appointments, etc.  Non-urgent messages can be sent to your provider as well.   To learn more about what you can do with MyChart, go to [email or web address].    Your next appointment:   4 week(s)  The format for your next appointment:   In Person  Provider:   Debbe Odea, MD   Other Instructions N/A

## 2020-04-02 ENCOUNTER — Ambulatory Visit: Payer: Self-pay | Admitting: *Deleted

## 2020-04-02 NOTE — Progress Notes (Signed)
Established patient visit   Patient: Jon Yang   DOB: 08-18-1965   54 y.o. Male  MRN: [id number] Visit Date: 04/03/2020  Today's healthcare provider: Trey Sailors, PA-C   Chief Complaint  Patient presents with  . Dizziness  I,Porsha C McClurkin,acting as a scribe for Trey Sailors, PA-C.,have documented all relevant documentation on the behalf of Trey Sailors, PA-C,as directed by  Trey Sailors, PA-C while in the presence of Trey Sailors, PA-C.  Subjective    Dizziness This is a new problem. The current episode started 1 to 4 weeks ago. The problem occurs rarely. The problem has been resolved. Pertinent negatives include no fatigue, headaches, numbness, visual change or weakness. Nothing aggravates the symptoms. He has tried nothing for the symptoms. The treatment provided no relief.  Patient reports that he only had the episode once and was concerned do to recent stroke in July,202, his second in total. . He has had a left pontine stroke a couple of months ago and is dealing with the consequences of this. Previously he had a hemorrhagic stroke. He is heterozygous for Factor V Leiden but treatment has been deferred by oncology due to history of hemorrhagic stroke.Marland Kitchen He reports he has returned to work which has had variable outcomes.      Medications: Outpatient Medications Prior to Visit  Medication Sig  . albuterol (VENTOLIN HFA) 108 (90 Base) MCG/ACT inhaler Inhale 2 puffs into the lungs every 6 (six) hours as needed for wheezing or shortness of breath.  Marland Kitchen aspirin EC 81 MG EC tablet Take 1 tablet (81 mg total) by mouth daily. Swallow whole.  Marland Kitchen atorvastatin (LIPITOR) 40 MG tablet Take 1 tablet (40 mg total) by mouth daily.  . carvedilol (COREG) 12.5 MG tablet Take 1 tablet (12.5 mg total) by mouth 2 (two) times daily with a meal.  . clopidogrel (PLAVIX) 75 MG tablet TAKE 1 TABLET BY MOUTH EVERY DAY  . fluticasone (FLONASE) 50 MCG/ACT nasal spray Place 1 spray  into both nostrils 2 (two) times daily.  . Fluticasone-Umeclidin-Vilant (TRELEGY ELLIPTA) 100-62.5-25 MCG/INH AEPB INHALE 1 PUFF BY MOUTH EVERY DAY  . ipratropium-albuterol (DUONEB) 0.5-2.5 (3) MG/3ML SOLN Inhale 3 mLs into the lungs 4 (four) times daily as needed.  Marland Kitchen losartan (COZAAR) 50 MG tablet Take 1 tablet (50 mg total) by mouth in the morning and at bedtime.  . nicotine (NICODERM CQ - DOSED IN MG/24 HOURS) 21 mg/24hr patch Place 1 patch (21 mg total) onto the skin daily. (Patient not taking: Reported on 02/18/2020)  . Spacer/Aero-Holding Chambers (OPTICHAMBER DIAMOND-SM MASK) MISC Use with inhalers.   No facility-administered medications prior to visit.    Review of Systems  Constitutional: Negative for fatigue.  Respiratory: Negative.   Cardiovascular: Negative.   Neurological: Positive for dizziness. Negative for weakness, numbness and headaches.      Objective    BP (!) 162/102 (BP Location: Left Arm, Patient Position: Sitting, Cuff Size: Normal)   Pulse 70   Temp 98.5 F (36.9 C) (Oral)   Wt 230 lb 9.6 oz (104.6 kg)   SpO2 95%   BMI 34.05 kg/m    Physical Exam Constitutional:      Appearance: Normal appearance. He is obese.  Cardiovascular:     Rate and Rhythm: Normal rate and regular rhythm.     Heart sounds: Normal heart sounds.  Pulmonary:     Effort: Pulmonary effort is normal.     Breath sounds: Normal breath  sounds.  Skin:    General: Skin is warm and dry.  Neurological:     General: No focal deficit present.     Mental Status: He is alert and oriented to person, place, and time. Mental status is at baseline.  Psychiatric:        Mood and Affect: Mood normal.        Behavior: Behavior normal.       No results found for any visits on 04/03/20.  Assessment & Plan    1. Cerebrovascular accident (CVA), unspecified mechanism (HCC)  Talked about variable course after stroke. This is his second stroke. Discussed expectations and course of  recovery.  2. Heterozygous factor V Leiden mutation (HCC)     No follow-ups on file.      ITrey Sailors, PA-C, have reviewed all documentation for this visit. The documentation on 04/07/20 for the exam, diagnosis, procedures, and orders are all accurate and complete.  The entirety of the information documented in the History of Present Illness, Review of Systems and Physical Exam were personally obtained by me. Portions of this information were initially documented by [facility] and reviewed by me for thoroughness and accuracy.   I spent 20 minutes dedicated to the care of this patient on the date of this encounter to include pre-visit review of records, face-to-face time with the patient discussing stroke recovery, and post visit ordering of testing.     Maryella Shivers  [facility] [phone] (phone) [phone] (fax)  [facility]

## 2020-04-02 NOTE — Telephone Encounter (Signed)
Patient is recovering from recent hemorraghic stroke which effected the R side of his body. Patient states he does have weak spells and feels off balance at times.  Patient gets disoriented and off balance at times- mostly associated with head movement. Yesterday patient about fell backwards. Patient is having weakness at times. Patient is tired a lot- after release from hospital.  Patient had been taking new medications and he is trying to adjust to them.  Appointment schedule for tomorrow.  Reason for Disposition . [1] MILD weakness (i.e., does not interfere with ability to work, go to school, normal activities) AND [2] persists > 1 week . [1] MILD dizziness (e.g., walking normally) AND [2] has NOT been evaluated by physician for this  (Exception: dizziness caused by heat exposure, sudden standing, or poor fluid intake)  Answer Assessment - Initial Assessment Questions 1. DESCRIPTION: "Describe how you are feeling."     Patient has weak spells- feels run down in the morning and after work- sometimes at work 2. SEVERITY: "How bad is it?"  "Can you stand and walk?"   - MILD - Feels weak or tired, but does not interfere with work, school or normal activities   - MODERATE - Able to stand and walk; weakness interferes with work, school, or normal activities   - SEVERE - Unable to stand or walk     mild 3. ONSET:  "When did the weakness begin?"     Patient states he has had this starting 2 weeks after discharge 4. CAUSE: "What do you think is causing the weakness?"     Unsure cause 5. MEDICINES: "Have you recently started a new medicine or had a change in the amount of a medicine?"     New BP, cholesterol, blood thinner 6. OTHER SYMPTOMS: "Do you have any other symptoms?" (e.g., chest pain, fever, cough, SOB, vomiting, diarrhea, bleeding, other areas of pain)     Dizziness- off balance, constipation 7. PREGNANCY: "Is there any chance you are pregnant?" "When was your last menstrual period?"      n/a  Answer Assessment - Initial Assessment Questions 1. DESCRIPTION: "Describe your dizziness."     Off balance 2. LIGHTHEADED: "Do you feel lightheaded?" (e.g., somewhat faint, woozy, weak upon standing)     Not lightheaded 3. VERTIGO: "Do you feel like either you or the room is spinning or tilting?" (i.e. vertigo)     no 4. SEVERITY: "How bad is it?"  "Do you feel like you are going to faint?" "Can you stand and walk?"   - MILD: Feels slightly dizzy, but walking normally.   - MODERATE: Feels very unsteady when walking, but not falling; interferes with normal activities (e.g., school, work) .   - SEVERE: Unable to walk without falling, or requires assistance to walk without falling; feels like passing out now.      Mild/moderate 5. ONSET:  "When did the dizziness begin?"     Shortly after release from hospital 6. AGGRAVATING FACTORS: "Does anything make it worse?" (e.g., standing, change in head position)     Changing head position 7. HEART RATE: "Can you tell me your heart rate?" "How many beats in 15 seconds?"  (Note: not all patients can do this)       Not noticed 8. CAUSE: "What do you think is causing the dizziness?"     Not sure 9. RECURRENT SYMPTOM: "Have you had dizziness before?" If Yes, ask: "When was the last time?" "What happened that time?"  never 10. OTHER SYMPTOMS: "Do you have any other symptoms?" (e.g., fever, chest pain, vomiting, diarrhea, bleeding)       no 11. PREGNANCY: "Is there any chance you are pregnant?" "When was your last menstrual period?"       n/a  Protocols used: WEAKNESS (GENERALIZED) AND FATIGUE-A-AH, DIZZINESS - [facility]

## 2020-04-02 NOTE — Telephone Encounter (Signed)
FYI. Please review. Thanks!  

## 2020-04-03 ENCOUNTER — Encounter: Payer: Self-pay | Admitting: Physician Assistant

## 2020-04-03 ENCOUNTER — Ambulatory Visit (INDEPENDENT_AMBULATORY_CARE_PROVIDER_SITE_OTHER): Payer: 59 | Admitting: Physician Assistant

## 2020-04-03 ENCOUNTER — Other Ambulatory Visit: Payer: Self-pay

## 2020-04-03 VITALS — BP 145/86 | HR 70 | Temp 98.5°F | Wt 230.6 lb

## 2020-04-03 DIAGNOSIS — D6851 Activated protein C resistance: Secondary | ICD-10-CM | POA: Diagnosis not present

## 2020-04-03 DIAGNOSIS — I639 Cerebral infarction, unspecified: Secondary | ICD-10-CM

## 2020-04-07 ENCOUNTER — Encounter: Payer: Self-pay | Admitting: Physician Assistant

## 2020-04-25 ENCOUNTER — Ambulatory Visit (INDEPENDENT_AMBULATORY_CARE_PROVIDER_SITE_OTHER): Payer: 59 | Admitting: Physician Assistant

## 2020-04-25 ENCOUNTER — Other Ambulatory Visit: Payer: Self-pay

## 2020-04-25 ENCOUNTER — Ambulatory Visit: Payer: 59 | Admitting: Cardiology

## 2020-04-25 ENCOUNTER — Encounter: Payer: Self-pay | Admitting: Physician Assistant

## 2020-04-25 VITALS — BP 156/102 | HR 61 | Temp 98.2°F | Wt 227.8 lb

## 2020-04-25 DIAGNOSIS — I10 Essential (primary) hypertension: Secondary | ICD-10-CM

## 2020-04-25 DIAGNOSIS — Z1211 Encounter for screening for malignant neoplasm of colon: Secondary | ICD-10-CM

## 2020-04-25 DIAGNOSIS — E785 Hyperlipidemia, unspecified: Secondary | ICD-10-CM

## 2020-04-25 NOTE — Progress Notes (Signed)
Established patient visit   Patient: Jon Yang   DOB: Dec 21, 1965   54 y.o. Male  MRN: [id number] Visit Date: 04/25/2020  Today's healthcare provider: Trey Sailors, PA-C   Chief Complaint  Patient presents with  . Hyperlipidemia  . Hypertension  I,Lucerito Rosinski M Obie Silos,acting as a scribe for [employer], PA-C.,have documented all relevant documentation on the behalf of Trey Sailors, PA-C,as directed by  Trey Sailors, PA-C while in the presence of Trey Sailors, PA-C.  Subjective    HPI  Hypertension, follow-up  BP Readings from Last 3 Encounters:  04/25/20 (!) 156/102  04/03/20 (!) 145/86  03/28/20 120/90   Wt Readings from Last 3 Encounters:  04/25/20 227 lb 12.8 oz (103.3 kg)  04/03/20 230 lb 9.6 oz (104.6 kg)  03/28/20 228 lb 8 oz (103.6 kg)     He was last seen for hypertension 6 months ago.  BP at that visit was 148/100. Management since that visit includes start Metoprolol 25 mg 2 times daily in addition to other BP medications .  He reports good compliance with treatment. He is not having side effects.  He is following a Regular diet. He is not exercising. He does smoke.  Use of agents associated with hypertension: none.   Outside blood pressures are . Symptoms: No chest pain No chest pressure  No palpitations No syncope  No dyspnea No orthopnea  No paroxysmal nocturnal dyspnea No lower extremity edema   Pertinent labs: Lab Results  Component Value Date   CHOL 233 (H) 02/11/2020   HDL 33 (L) 02/11/2020   LDLCALC 166 (H) 02/11/2020   TRIG 169 (H) 02/11/2020   CHOLHDL 7.1 02/11/2020   Lab Results  Component Value Date   NA 137 02/10/2020   K 3.9 02/10/2020   CREATININE 0.97 02/10/2020   GFRNONAA >60 02/10/2020   GFRAA >60 02/10/2020   GLUCOSE 108 (H) 02/10/2020     The ASCVD Risk score (Goff DC Jr., et al., 2013) failed to calculate for the following reasons:   The patient has a prior MI or stroke diagnosis    --------------------------------------------------------------------------------------------------- Lipid/Cholesterol, Follow-up  Last lipid panel Other pertinent labs  Lab Results  Component Value Date   CHOL 233 (H) 02/11/2020   HDL 33 (L) 02/11/2020   LDLCALC 166 (H) 02/11/2020   TRIG 169 (H) 02/11/2020   CHOLHDL 7.1 02/11/2020   Lab Results  Component Value Date   ALT 28 02/10/2020   AST 28 02/10/2020   PLT 232 02/10/2020     He was last seen for this 6 months ago.  Management since that visit includes started Lipitor 10 mg qhs.  He reports good compliance with treatment. He is not having side effects.   Symptoms: No chest pain No chest pressure/discomfort  No dyspnea No lower extremity edema  No numbness or tingling of extremity No orthopnea  No palpitations No paroxysmal nocturnal dyspnea  No speech difficulty No syncope   Current diet: well balanced Current exercise: no regular exercise  The ASCVD Risk score Denman George DC Jr., et al., 2013) failed to calculate for the following reasons:   The patient has a prior MI or stroke diagnosis  ---------------------------------------------------------------------------------------------------      Medications: Outpatient Medications Prior to Visit  Medication Sig  . albuterol (VENTOLIN HFA) 108 (90 Base) MCG/ACT inhaler Inhale 2 puffs into the lungs every 6 (six) hours as needed for wheezing or shortness of breath.  Marland Kitchen aspirin EC 81  MG EC tablet Take 1 tablet (81 mg total) by mouth daily. Swallow whole.  Marland Kitchen atorvastatin (LIPITOR) 40 MG tablet Take 1 tablet (40 mg total) by mouth daily.  . carvedilol (COREG) 12.5 MG tablet Take 1 tablet (12.5 mg total) by mouth 2 (two) times daily with a meal.  . clopidogrel (PLAVIX) 75 MG tablet TAKE 1 TABLET BY MOUTH EVERY DAY  . fluticasone (FLONASE) 50 MCG/ACT nasal spray Place 1 spray into both nostrils 2 (two) times daily.  . Fluticasone-Umeclidin-Vilant (TRELEGY ELLIPTA)  100-62.5-25 MCG/INH AEPB INHALE 1 PUFF BY MOUTH EVERY DAY  . ipratropium-albuterol (DUONEB) 0.5-2.5 (3) MG/3ML SOLN Inhale 3 mLs into the lungs 4 (four) times daily as needed.  Marland Kitchen losartan (COZAAR) 50 MG tablet Take 1 tablet (50 mg total) by mouth in the morning and at bedtime.  Marland Kitchen Spacer/Aero-Holding Chambers (OPTICHAMBER DIAMOND-SM MASK) MISC Use with inhalers.  . nicotine (NICODERM CQ - DOSED IN MG/24 HOURS) 21 mg/24hr patch Place 1 patch (21 mg total) onto the skin daily. (Patient not taking: Reported on 02/18/2020)   No facility-administered medications prior to visit.    Review of Systems    Objective    BP (!) 156/102 (BP Location: Right Arm, Patient Position: Sitting, Cuff Size: Large)   Pulse 61   Temp 98.2 F (36.8 C) (Oral)   Wt 227 lb 12.8 oz (103.3 kg)   SpO2 98%   BMI 33.64 kg/m    Physical Exam Constitutional:      Appearance: Normal appearance.  Cardiovascular:     Rate and Rhythm: Normal rate and regular rhythm.     Pulses: Normal pulses.     Heart sounds: Normal heart sounds.  Pulmonary:     Effort: Pulmonary effort is normal.     Breath sounds: Normal breath sounds.  Skin:    General: Skin is warm and dry.  Neurological:     General: No focal deficit present.     Mental Status: He is alert and oriented to person, place, and time.  Psychiatric:        Mood and Affect: Mood normal.        Behavior: Behavior normal.       No results found for any visits on 04/25/20.  Assessment & Plan    1. Colon cancer screening  Patient reports first two cologuards never came to house. Will order again.   - Cologuard  2. Hyperlipidemia, unspecified hyperlipidemia type  Continue statin.   - Lipid Profile  3. Primary hypertension  BP better controlled during cardiology visit. Will continue to monitor.   - Comprehensive Metabolic Panel (CMET)    No follow-ups on file.      ITrey Sailors, PA-C, have reviewed all documentation for this visit. The  documentation on 04/29/20 for the exam, diagnosis, procedures, and orders are all accurate and complete.  The entirety of the information documented in the History of Present Illness, Review of Systems and Physical Exam were personally obtained by me. Portions of this information were initially documented by [facility] and reviewed by me for thoroughness and accuracy.     Maryella Shivers  [facility] [phone] (phone) [phone] (fax)  [facility]

## 2020-04-28 ENCOUNTER — Encounter: Payer: Self-pay | Admitting: Physician Assistant

## 2020-04-29 ENCOUNTER — Encounter: Payer: Self-pay | Admitting: Physician Assistant

## 2020-05-02 ENCOUNTER — Ambulatory Visit (INDEPENDENT_AMBULATORY_CARE_PROVIDER_SITE_OTHER): Payer: 59 | Admitting: Physician Assistant

## 2020-05-02 ENCOUNTER — Other Ambulatory Visit: Payer: Self-pay

## 2020-05-02 ENCOUNTER — Encounter: Payer: Self-pay | Admitting: Physician Assistant

## 2020-05-02 VITALS — BP 159/86 | HR 60 | Temp 98.7°F | Resp 16 | Wt 231.4 lb

## 2020-05-02 DIAGNOSIS — F32A Depression, unspecified: Secondary | ICD-10-CM

## 2020-05-02 DIAGNOSIS — R69 Illness, unspecified: Secondary | ICD-10-CM | POA: Diagnosis not present

## 2020-05-02 DIAGNOSIS — F419 Anxiety disorder, unspecified: Secondary | ICD-10-CM

## 2020-05-02 MED ORDER — FLUOXETINE HCL 20 MG PO TABS: 20.0000 mg | ORAL_TABLET | Freq: Every day | ORAL | 1 refills | Status: DC

## 2020-05-02 NOTE — Progress Notes (Signed)
Established patient visit   Patient: Jon Yang   DOB: 1966/04/30   54 y.o. Male  MRN: [id number] Visit Date: 05/02/2020  Today's healthcare provider: Trey Sailors, PA-C   Chief Complaint  Patient presents with  . Depression   Subjective    HPI  Anxiety, Follow-up   He feels his anxiety is moderate and severe and Improved since last visit. He reports his symptoms have worsened since he had his stroke. One time he had a panic attack two years ago - he was driving to work in the dark and felt suddenly unfamiliar with the surroundings which caused him to panic. Other than this, he has not been diagnosed with anxiety and depression. Reports he has good days and bad days. Reports he has had difficulty adjusting to his new baseline after having a stroke.   Symptoms: No chest pain No difficulty concentrating  No dizziness Yes fatigue  No feelings of losing control No insomnia  Yes irritable No palpitations  No panic attacks No racing thoughts  No shortness of breath No sweating  No tremors/shakes    GAD-7 Results GAD-7 Generalized Anxiety Disorder Screening Tool 05/02/2020  1. Feeling Nervous, Anxious, or on Edge 3  2. Not Being Able to Stop or Control Worrying 2  3. Worrying Too Much About Different Things 2  4. Trouble Relaxing 2  5. Being So Restless it's Hard To Sit Still 2  6. Becoming Easily Annoyed or Irritable 3  7. Feeling Afraid As If Something Awful Might Happen 2  Total GAD-7 Score 16  Difficulty At Work, Home, or Getting  Along With Others? Somewhat difficult    PHQ-9 Scores PHQ9 SCORE ONLY 04/25/2020 03/06/2019 08/17/2017  PHQ-9 Total Score 0 0 5   Depression, Follow-up  He reports not wanting to socialize when he normally does. He reports being less social.   Depression screen [facility] 2/9 04/25/2020 03/06/2019 08/17/2017  Decreased Interest 0 0 0  Down, Depressed, Hopeless 0 0 0  PHQ - 2 Score 0 0 0  Altered sleeping 0 - 0  Tired, decreased energy 0 - 1   Change in appetite 0 - 1  Feeling bad or failure about yourself  0 - 0  Trouble concentrating 0 - 1  Moving slowly or fidgety/restless 0 - 2  Suicidal thoughts 0 - 0  PHQ-9 Score 0 - 5  Difficult doing work/chores Not difficult at all - Not difficult at all    -----------------------------------------------------------------------------------------  BP Readings from Last 3 Encounters:  05/02/20 (!) 159/86  04/25/20 (!) 156/102  04/03/20 (!) 145/86       Medications: Outpatient Medications Prior to Visit  Medication Sig  . albuterol (VENTOLIN HFA) 108 (90 Base) MCG/ACT inhaler Inhale 2 puffs into the lungs every 6 (six) hours as needed for wheezing or shortness of breath.  Marland Kitchen aspirin EC 81 MG EC tablet Take 1 tablet (81 mg total) by mouth daily. Swallow whole.  Marland Kitchen atorvastatin (LIPITOR) 40 MG tablet Take 1 tablet (40 mg total) by mouth daily.  . carvedilol (COREG) 12.5 MG tablet Take 1 tablet (12.5 mg total) by mouth 2 (two) times daily with a meal.  . clopidogrel (PLAVIX) 75 MG tablet TAKE 1 TABLET BY MOUTH EVERY DAY  . fluticasone (FLONASE) 50 MCG/ACT nasal spray Place 1 spray into both nostrils 2 (two) times daily.  . Fluticasone-Umeclidin-Vilant (TRELEGY ELLIPTA) 100-62.5-25 MCG/INH AEPB INHALE 1 PUFF BY MOUTH EVERY DAY  . ipratropium-albuterol (DUONEB) 0.5-2.5 (3) MG/3ML  SOLN Inhale 3 mLs into the lungs 4 (four) times daily as needed.  Marland Kitchen losartan (COZAAR) 50 MG tablet Take 1 tablet (50 mg total) by mouth in the morning and at bedtime.  . nicotine (NICODERM CQ - DOSED IN MG/24 HOURS) 21 mg/24hr patch Place 1 patch (21 mg total) onto the skin daily. (Patient not taking: Reported on 02/18/2020)  . Spacer/Aero-Holding Chambers (OPTICHAMBER DIAMOND-SM MASK) MISC Use with inhalers.   No facility-administered medications prior to visit.    Review of Systems    Objective    BP (!) 159/86   Pulse 60   Temp 98.7 F (37.1 C)   Resp 16   Wt 231 lb 6.4 oz (105 kg)   BMI 34.17  kg/m    Physical Exam Constitutional:      Appearance: Normal appearance.  Cardiovascular:     Rate and Rhythm: Normal rate and regular rhythm.     Heart sounds: Normal heart sounds.  Pulmonary:     Effort: Pulmonary effort is normal.     Breath sounds: Normal breath sounds.  Skin:    General: Skin is warm and dry.  Neurological:     Mental Status: He is alert and oriented to person, place, and time. Mental status is at baseline.  Psychiatric:        Mood and Affect: Mood normal.        Behavior: Behavior normal.       No results found for any visits on 05/02/20.  Assessment & Plan     1. Anxiety and depression  Patient declines counseling at this point.   Will Start prozac 20 mg daily Discussed potential side effects, incl GI upset, sexual dysfunction, increased anxiety, and SI Discussed that it can take 6-8 weeks to reach full efficacy Contracted for safety - no SI/HI Discussed synergistic effects of medications and therapy  - FLUoxetine (PROZAC) 20 MG tablet; Take 1 tablet (20 mg total) by mouth daily.  Dispense: 90 tablet; Refill: 1   Return in about 6 weeks (around 06/13/2020).      ITrey Sailors, PA-C, have reviewed all documentation for this visit. The documentation on 05/08/20 for the exam, diagnosis, procedures, and orders are all accurate and complete.  The entirety of the information documented in the History of Present Illness, Review of Systems and Physical Exam were personally obtained by me. Portions of this information were initially documented by [place], CMA and reviewed by me for thoroughness and accuracy.     Maryella Shivers  [facility] [phone] (phone) [phone] (fax)  [facility]

## 2020-05-12 ENCOUNTER — Ambulatory Visit: Payer: 59 | Admitting: Cardiology

## 2020-05-15 DIAGNOSIS — Z1211 Encounter for screening for malignant neoplasm of colon: Secondary | ICD-10-CM | POA: Diagnosis not present

## 2020-05-30 DIAGNOSIS — H524 Presbyopia: Secondary | ICD-10-CM | POA: Diagnosis not present

## 2020-05-30 LAB — COLOGUARD
COLOGUARD: NEGATIVE
Cologuard: NEGATIVE

## 2020-06-12 NOTE — Progress Notes (Signed)
Established patient visit   Patient: Jon Yang   DOB: Sep 04, 1965   54 y.o. Male  MRN: [id number] Visit Date: 06/13/2020  Today's healthcare provider: Trey Sailors, PA-C   Chief Complaint  Patient presents with  . Anxiety  I,Adriana M Pollak,acting as a scribe for Trey Sailors, PA-C.,have documented all relevant documentation on the behalf of Trey Sailors, PA-C,as directed by  Trey Sailors, PA-C while in the presence of Trey Sailors, PA-C.  Subjective    HPI  Anxiety & Depression, Follow-up  He was last seen for anxiety 6 weeks ago. Changes made at last visit include Prozac 20 mg daily .   He reports poor compliance with treatment. He reports fair to poor tolerance of treatment. He is not having side effects. He is not taking the medication.   He feels his anxiety is mild and Improved since last visit.  Symptoms: No chest pain No difficulty concentrating  No dizziness No fatigue  No feelings of losing control No insomnia  No irritable No palpitations  No panic attacks No racing thoughts  No shortness of breath No sweating  No tremors/shakes    GAD-7 Results GAD-7 Generalized Anxiety Disorder Screening Tool 05/02/2020  1. Feeling Nervous, Anxious, or on Edge 3  2. Not Being Able to Stop or Control Worrying 2  3. Worrying Too Much About Different Things 2  4. Trouble Relaxing 2  5. Being So Restless it's Hard To Sit Still 2  6. Becoming Easily Annoyed or Irritable 3  7. Feeling Afraid As If Something Awful Might Happen 2  Total GAD-7 Score 16  Difficulty At Work, Home, or Getting  Along With Others? Somewhat difficult    PHQ-9 Scores PHQ9 SCORE ONLY 06/13/2020 04/25/2020 03/06/2019  PHQ-9 Total Score 0 0 0    ---------------------------------------------------------------------------------------------------      Medications: Outpatient Medications Prior to Visit  Medication Sig  . albuterol (VENTOLIN HFA) 108 (90 Base) MCG/ACT  inhaler Inhale 2 puffs into the lungs every 6 (six) hours as needed for wheezing or shortness of breath.  Marland Kitchen aspirin EC 81 MG EC tablet Take 1 tablet (81 mg total) by mouth daily. Swallow whole.  Marland Kitchen atorvastatin (LIPITOR) 40 MG tablet Take 1 tablet (40 mg total) by mouth daily.  . carvedilol (COREG) 12.5 MG tablet Take 1 tablet (12.5 mg total) by mouth 2 (two) times daily with a meal.  . clopidogrel (PLAVIX) 75 MG tablet TAKE 1 TABLET BY MOUTH EVERY DAY  . fluticasone (FLONASE) 50 MCG/ACT nasal spray Place 1 spray into both nostrils 2 (two) times daily.  . Fluticasone-Umeclidin-Vilant (TRELEGY ELLIPTA) 100-62.5-25 MCG/INH AEPB INHALE 1 PUFF BY MOUTH EVERY DAY  . ipratropium-albuterol (DUONEB) 0.5-2.5 (3) MG/3ML SOLN Inhale 3 mLs into the lungs 4 (four) times daily as needed.  Marland Kitchen losartan (COZAAR) 50 MG tablet Take 1 tablet (50 mg total) by mouth in the morning and at bedtime.  Marland Kitchen Spacer/Aero-Holding Chambers (OPTICHAMBER DIAMOND-SM MASK) MISC Use with inhalers.  Marland Kitchen FLUoxetine (PROZAC) 20 MG tablet Take 1 tablet (20 mg total) by mouth daily. (Patient not taking: Reported on 06/13/2020)  . [DISCONTINUED] nicotine (NICODERM CQ - DOSED IN MG/24 HOURS) 21 mg/24hr patch Place 1 patch (21 mg total) onto the skin daily. (Patient not taking: Reported on 02/18/2020)   No facility-administered medications prior to visit.    Review of Systems  Constitutional: Negative.   Respiratory: Negative.   Cardiovascular: Negative.   Neurological: Negative.   Psychiatric/Behavioral:  Negative for agitation, confusion, decreased concentration, self-injury, sleep disturbance and suicidal ideas. The patient is not nervous/anxious.       Objective    BP (!) 156/90 (BP Location: Left Arm, Patient Position: Sitting, Cuff Size: Large)   Pulse 67   Temp 98.3 F (36.8 C) (Oral)   Wt 229 lb 12.8 oz (104.2 kg)   SpO2 97%   BMI 33.94 kg/m    Physical Exam Constitutional:      Appearance: Normal appearance. He is obese.   Skin:    General: Skin is warm and dry.  Neurological:     General: No focal deficit present.     Mental Status: He is alert and oriented to person, place, and time.  Psychiatric:        Mood and Affect: Mood normal.        Behavior: Behavior normal.       No results found for any visits on 06/13/20.  Assessment & Plan    1. Cerebrovascular accident (CVA), unspecified mechanism (HCC)  He did not take Prozac. He feels better and does not need the medication.    Return if symptoms worsen or fail to improve.      ITrey Sailors, PA-C, have reviewed all documentation for this visit. The documentation on 06/13/20 for the exam, diagnosis, procedures, and orders are all accurate and complete.  The entirety of the information documented in the History of Present Illness, Review of Systems and Physical Exam were personally obtained by me. Portions of this information were initially documented by [facility] and reviewed by me for thoroughness and accuracy.     Maryella Shivers  [facility] [phone] (phone) [phone] (fax)  [facility]

## 2020-06-13 ENCOUNTER — Other Ambulatory Visit: Payer: Self-pay

## 2020-06-13 ENCOUNTER — Encounter: Payer: Self-pay | Admitting: Physician Assistant

## 2020-06-13 ENCOUNTER — Ambulatory Visit (INDEPENDENT_AMBULATORY_CARE_PROVIDER_SITE_OTHER): Payer: 59 | Admitting: Physician Assistant

## 2020-06-13 VITALS — BP 156/90 | HR 67 | Temp 98.3°F | Wt 229.8 lb

## 2020-06-13 DIAGNOSIS — I639 Cerebral infarction, unspecified: Secondary | ICD-10-CM

## 2020-06-13 NOTE — Patient Instructions (Signed)

## 2020-07-08 DIAGNOSIS — Z03818 Encounter for observation for suspected exposure to other biological agents ruled out: Secondary | ICD-10-CM | POA: Diagnosis not present

## 2020-07-08 DIAGNOSIS — Z20822 Contact with and (suspected) exposure to covid-19: Secondary | ICD-10-CM | POA: Diagnosis not present

## 2020-07-19 ENCOUNTER — Encounter: Payer: Self-pay | Admitting: Emergency Medicine

## 2020-07-19 ENCOUNTER — Other Ambulatory Visit: Payer: Self-pay

## 2020-07-19 ENCOUNTER — Emergency Department: Payer: No Typology Code available for payment source

## 2020-07-19 ENCOUNTER — Emergency Department
Admission: EM | Admit: 2020-07-19 | Discharge: 2020-07-19 | Disposition: A | Discharge status: Home / Self Care | Payer: No Typology Code available for payment source | Attending: Student in an Organized Health Care Education/Training Program | Admitting: Student in an Organized Health Care Education/Training Program

## 2020-07-19 DIAGNOSIS — W228XXA Striking against or struck by other objects, initial encounter: Secondary | ICD-10-CM | POA: Insufficient documentation

## 2020-07-19 DIAGNOSIS — Z7901 Long term (current) use of anticoagulants: Secondary | ICD-10-CM | POA: Insufficient documentation

## 2020-07-19 DIAGNOSIS — I10 Essential (primary) hypertension: Secondary | ICD-10-CM | POA: Insufficient documentation

## 2020-07-19 DIAGNOSIS — S20211A Contusion of right front wall of thorax, initial encounter: Secondary | ICD-10-CM | POA: Insufficient documentation

## 2020-07-19 DIAGNOSIS — J449 Chronic obstructive pulmonary disease, unspecified: Secondary | ICD-10-CM | POA: Diagnosis not present

## 2020-07-19 DIAGNOSIS — R1011 Right upper quadrant pain: Secondary | ICD-10-CM | POA: Diagnosis not present

## 2020-07-19 DIAGNOSIS — R109 Unspecified abdominal pain: Secondary | ICD-10-CM

## 2020-07-19 DIAGNOSIS — Z7982 Long term (current) use of aspirin: Secondary | ICD-10-CM | POA: Diagnosis not present

## 2020-07-19 DIAGNOSIS — S301XXA Contusion of abdominal wall, initial encounter: Secondary | ICD-10-CM | POA: Diagnosis not present

## 2020-07-19 DIAGNOSIS — R0781 Pleurodynia: Secondary | ICD-10-CM

## 2020-07-19 DIAGNOSIS — Z7951 Long term (current) use of inhaled steroids: Secondary | ICD-10-CM | POA: Diagnosis not present

## 2020-07-19 DIAGNOSIS — M79602 Pain in left arm: Secondary | ICD-10-CM | POA: Insufficient documentation

## 2020-07-19 DIAGNOSIS — F17200 Nicotine dependence, unspecified, uncomplicated: Secondary | ICD-10-CM | POA: Insufficient documentation

## 2020-07-19 DIAGNOSIS — S299XXA Unspecified injury of thorax, initial encounter: Secondary | ICD-10-CM | POA: Diagnosis present

## 2020-07-19 DIAGNOSIS — Z79899 Other long term (current) drug therapy: Secondary | ICD-10-CM | POA: Diagnosis not present

## 2020-07-19 DIAGNOSIS — Y990 Civilian activity done for income or pay: Secondary | ICD-10-CM | POA: Diagnosis not present

## 2020-07-19 LAB — COMPREHENSIVE METABOLIC PANEL
ALT: 34 U/L (ref 0–44)
AST: 32 U/L (ref 15–41)
Albumin: 4.2 g/dL (ref 3.5–5.0)
Alkaline Phosphatase: 92 U/L (ref 38–126)
Anion gap: 9 (ref 5–15)
BUN: 16 mg/dL (ref 6–20)
CO2: 24 mmol/L (ref 22–32)
Calcium: 9 mg/dL (ref 8.9–10.3)
Chloride: 103 mmol/L (ref 98–111)
Creatinine, Ser: 0.93 mg/dL (ref 0.61–1.24)
GFR, Estimated: >60 mL/min (ref >60)
Glucose, Bld: 92 mg/dL (ref 70–99)
Potassium: 4.3 mmol/L (ref 3.5–5.1)
Sodium: 136 mmol/L (ref 135–145)
Total Bilirubin: 2.8 mg/dL — ABNORMAL HIGH (ref 0.3–1.2)
Total Protein: 7.7 g/dL (ref 6.5–8.1)

## 2020-07-19 LAB — CBC WITH DIFFERENTIAL/PLATELET
Abs Immature Granulocytes: 0.05 10*3/uL (ref 0.00–0.07)
Basophils Absolute: 0.1 10*3/uL (ref 0.0–0.1)
Basophils Relative: 1 %
Eosinophils Absolute: 0.2 10*3/uL (ref 0.0–0.5)
Eosinophils Relative: 2 %
HCT: 52.5 % — ABNORMAL HIGH (ref 39.0–52.0)
Hemoglobin: 18.4 g/dL — ABNORMAL HIGH (ref 13.0–17.0)
Immature Granulocytes: 0 %
Lymphocytes Relative: 21 %
Lymphs Abs: 2.6 10*3/uL (ref 0.7–4.0)
MCH: 31.2 pg (ref 26.0–34.0)
MCHC: 35 g/dL (ref 30.0–36.0)
MCV: 89 fL (ref 80.0–100.0)
Monocytes Absolute: 1.3 10*3/uL — ABNORMAL HIGH (ref 0.1–1.0)
Monocytes Relative: 10 %
Neutro Abs: 8.4 10*3/uL — ABNORMAL HIGH (ref 1.7–7.7)
Neutrophils Relative %: 66 %
Platelets: 218 10*3/uL (ref 150–400)
RBC: 5.9 MIL/uL — ABNORMAL HIGH (ref 4.22–5.81)
RDW: 12.3 % (ref 11.5–15.5)
WBC: 12.6 10*3/uL — ABNORMAL HIGH (ref 4.0–10.5)
nRBC: 0 % (ref 0.0–0.2)

## 2020-07-19 MED ORDER — IOHEXOL 300 MG/ML  SOLN
125.0000 mL | Freq: Once | INTRAMUSCULAR | Status: AC | PRN
  Administered 2020-07-19: 125 mL via INTRAVENOUS

## 2020-07-19 MED ORDER — CYCLOBENZAPRINE HCL 5 MG PO TABS: 5.0000 mg | ORAL_TABLET | Freq: Three times a day (TID) | ORAL | 0 refills | Status: DC | PRN

## 2020-07-19 MED ORDER — KETOROLAC TROMETHAMINE 30 MG/ML IJ SOLN
15.0000 mg | Freq: Once | INTRAMUSCULAR | Status: AC
  Administered 2020-07-19: 15 mg via INTRAVENOUS
  Filled 2020-07-19: qty 1

## 2020-07-19 NOTE — ED Notes (Signed)
X-ray at bedside

## 2020-07-19 NOTE — ED Provider Notes (Signed)
[facility] Emergency Department Provider Note    Event Date/Time   First MD Initiated Contact with Patient 07/19/20 [phone]     (approximate)  I have reviewed the triage vital signs and the nursing notes.   HISTORY  Chief Complaint Arm Pain and Rib Injury    HPI Jon Yang is a 54 y.o. 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  The dug out roughly 5 foot deep ditch and while there wrapping up the patient states that he unlocked the tailgate on a dump truck but was under significant pressure from the gravel and when it unlocked it swung open striking him lifting him off of his feet and throwing him several feet onto the opposite embankment and then falling into a ditch.  No LOC.  No neck pain.  Does have significant right-sided chest wall pain and right upper quadrant pain with some bruising.  No nausea or vomiting.  Rates the pain is moderate.   Past Medical History:  Diagnosis Date  . Hyperlipidemia   . Hypertension   . Stroke [facility]) 2017   Family History  Problem Relation Age of Onset  . Healthy Mother   . Lung cancer Father   . Prostate cancer Father   . Healthy Sister   . Hypertension Brother   . Healthy Sister    Past Surgical History:  Procedure Laterality Date  . NO PAST SURGERIES     Patient Active Problem List   Diagnosis Date Noted  . Heterozygous factor V Leiden mutation (HCC) 03/04/2020  . Aphasia   . Hyperlipidemia 10/25/2019  . History of hemorrhagic stroke with residual hemiparesis (HCC) 03/06/2019  . Thrombus of pulmonary vein (HCC) 11/21/2017  . Migraines 11/03/2017  . Chest pain 10/21/2017  . Headache 10/21/2017  . COPD (chronic obstructive pulmonary disease) (HCC) 08/09/2017  . Bronchitis 08/09/2017  . Hypertension 11/10/2015  . ICH (intracerebral hemorrhage) (HCC) 11/10/2015  . Nontraumatic subcortical hemorrhage of right cerebral hemisphere (HCC)  11/10/2015  . Tobacco abuse 11/10/2015  . Stroke [facility]) 2017      Prior to Admission medications   Medication Sig Start Date End Date Taking? Authorizing Provider  albuterol (VENTOLIN HFA) 108 (90 Base) MCG/ACT inhaler Inhale 2 puffs into the lungs every 6 (six) hours as needed for wheezing or shortness of breath. 11/16/19   Trey Sailors, PA-C  aspirin EC 81 MG EC tablet Take 1 tablet (81 mg total) by mouth daily. Swallow whole. 02/12/20   Arnetha Courser, MD  atorvastatin (LIPITOR) 40 MG tablet Take 1 tablet (40 mg total) by mouth daily. 02/11/20 02/10/21  Arnetha Courser, MD  carvedilol (COREG) 12.5 MG tablet Take 1 tablet (12.5 mg total) by mouth 2 (two) times daily with a meal. 03/28/20   Debbe Odea, MD  clopidogrel (PLAVIX) 75 MG tablet TAKE 1 TABLET BY MOUTH EVERY DAY 03/19/20   Osvaldo Angst M, PA-C  FLUoxetine (PROZAC) 20 MG tablet Take 1 tablet (20 mg total) by mouth daily. Patient not taking: Reported on 06/13/2020 05/02/20   Trey Sailors, PA-C  fluticasone [facility]) 50 MCG/ACT nasal spray Place 1 spray into both nostrils 2 (two) times daily. 01/24/17   [provider]  Fluticasone-Umeclidin-Vilant (TRELEGY ELLIPTA) 100-62.5-25 MCG/INH AEPB INHALE 1 PUFF BY MOUTH EVERY DAY 03/24/20   Trey Sailors, PA-C  ipratropium-albuterol (DUONEB) 0.5-2.5 (3) MG/3ML SOLN Inhale 3 mLs into the lungs 4 (four)  times daily as needed. 12/17/19   Trey Sailors, PA-C  losartan (COZAAR) 50 MG tablet Take 1 tablet (50 mg total) by mouth in the morning and at bedtime. 03/28/20 06/26/20  Debbe Odea, MD  Spacer/Aero-Holding Chambers (OPTICHAMBER DIAMOND-SM MASK) MISC Use with inhalers. 08/11/17   [provider]    Allergies Shellfish allergy    Social History Social History   Tobacco Use  . Smoking status: Current Every Day Smoker    Packs/day: 1.00  . Smokeless tobacco: Never Used  Vaping Use  . Vaping Use: Never used  Substance Use Topics  . Alcohol use: No   . Drug use: No    Review of Systems Patient denies headaches, rhinorrhea, blurry vision, numbness, shortness of breath, chest pain, edema, cough, abdominal pain, nausea, vomiting, diarrhea, dysuria, fevers, rashes or hallucinations unless otherwise stated above in HPI. ____________________________________________   PHYSICAL EXAM:  VITAL SIGNS: Vitals:   07/19/20 0900 07/19/20 0903  BP: (!) 169/90   Pulse: 68   Resp: 16   Temp:  98.1 F (36.7 C)  SpO2: 96%     Constitutional: Alert and oriented.  Eyes: Conjunctivae are normal.  Head: Atraumatic. Nose: No congestion/rhinnorhea. Mouth/Throat: Mucous membranes are moist.   Neck: No stridor. Painless ROM.  Cardiovascular: Normal rate, regular rhythm. Grossly normal heart sounds.  Good peripheral circulation. Respiratory: Normal respiratory effort.  No retractions. Lungs CTAB. Gastrointestinal: Soft and nontender. No distention. No abdominal bruits. No CVA tenderness. Genitourinary:  Musculoskeletal: No osseous deformity.  Tenderness palpation of the mid upper arm.  No pain at the elbow with more proximal shoulder.  Neurovascular intact distally.  No step-offs or deformities CT or L-spine.  Does have tenderness palpation without crepitus of the right chest wall with some ecchymosis overlying the right upper quadrant.  No peritonitis.  No lower extremity tenderness nor edema.  No joint effusions. Neurologic:  Normal speech and language. No gross focal neurologic deficits are appreciated. No facial droop Skin:  Skin is warm, dry and intact. No rash noted. Psychiatric: Mood and affect are normal. Speech and behavior are normal.  ____________________________________________   LABS (all labs ordered are listed, but only abnormal results are displayed)  No results found for this or any previous visit (from the past 24 hour(s)). ____________________________________________ ____________________________________________  RADIOLOGY  I  personally reviewed all radiographic images ordered to evaluate for the above acute complaints and reviewed radiology reports and findings.  These findings were personally discussed with the patient.  Please see medical record for radiology report.  ____________________________________________   PROCEDURES  Procedure(s) performed:  Procedures    Critical Care performed: no ____________________________________________   INITIAL IMPRESSION / ASSESSMENT AND PLAN / ED COURSE  Pertinent labs & imaging results that were available during my care of the patient were reviewed by me and considered in my medical decision making (see chart for details).   DDX:  fracture, contusion, soft tissue injury, viscous injury, concussion, hemorrhage, iph   Nicholas Trompeter is a 54 y.o. who presents to the ED with presentation as described above.  Patient is not toxic appearing as he ambulated into the ER room but is having significant right-sided pain given the mechanism of injury I am concerned for multiple rib fractures, contusion, liver injury and the above differential.  Will order CT imaging to further evaluate     The patient was evaluated in Emergency Department today for the symptoms described in the history of present illness. He/she was evaluated in the context  of the global COVID-19 pandemic, which necessitated consideration that the patient might be at risk for infection with the SARS-CoV-2 virus that causes COVID-19. Institutional protocols and algorithms that pertain to the evaluation of patients at risk for COVID-19 are in a state of rapid change based on information released by regulatory bodies including the CDC and federal and state organizations. These policies and algorithms were followed during the patient's care in the ED.  As part of my medical decision making, I reviewed the following data within the electronic MEDICAL RECORD NUMBER Nursing notes reviewed and incorporated, Labs reviewed, notes  from prior ED visits and [place] Controlled Substance Database   ____________________________________________   FINAL CLINICAL IMPRESSION(S) / ED DIAGNOSES  Final diagnoses:  None      NEW MEDICATIONS STARTED DURING THIS VISIT:  New Prescriptions   No medications on file     Note:  This document was prepared using Dragon voice recognition software and may include unintentional dictation errors.    Willy Eddy, MD 07/19/20 [phone]

## 2020-07-19 NOTE — ED Triage Notes (Signed)
Pt to ED via POV stating that he was at work this morning and got injured. Pt works for the [employer] and was fixing a water leak. Pt reports that the tailgate of a dump truck flew open and knocked him over into a the hole, pt states that the hole is about 5 foot deep. Pt is having pain in his left upper arm and right rib cage. Pt is in NAD.

## 2020-07-19 NOTE — ED Notes (Addendum)
Pt was unloading large dumptruck, opened tailgate and tailgate of dumptruck came down fast and hit him on R side of chest. R lateral chest/rib area is sore, nagging 7/10 pain that is worse with deep breath.  L arm also sharply painful with movement. 9/10 sharp pain L arm with movement.

## 2020-07-19 NOTE — ED Notes (Signed)
Worker Comp has been completed and walked to lab.

## 2020-07-22 ENCOUNTER — Ambulatory Visit: Payer: 59 | Admitting: Physician Assistant

## 2020-07-22 ENCOUNTER — Other Ambulatory Visit: Payer: Self-pay

## 2020-07-22 VITALS — BP 201/113 | HR 59 | Temp 97.7°F | Resp 22

## 2020-07-22 DIAGNOSIS — M7918 Myalgia, other site: Secondary | ICD-10-CM

## 2020-07-22 MED ORDER — CYCLOBENZAPRINE HCL 10 MG PO TABS: 10.0000 mg | ORAL_TABLET | Freq: Three times a day (TID) | ORAL | 0 refills | Status: DC | PRN

## 2020-07-22 MED ORDER — KETOROLAC TROMETHAMINE 10 MG PO TABS: 10.0000 mg | ORAL_TABLET | Freq: Four times a day (QID) | ORAL | 0 refills | Status: DC | PRN

## 2020-07-22 MED ORDER — LIDOCAINE 5 % EX PTCH: 1.0000 | MEDICATED_PATCH | CUTANEOUS | 0 refills | Status: DC

## 2020-07-22 MED ORDER — TRAMADOL HCL 50 MG PO TABS: 50.0000 mg | ORAL_TABLET | Freq: Two times a day (BID) | ORAL | 0 refills | Status: DC | PRN

## 2020-07-22 NOTE — Progress Notes (Signed)
Offered pain medication at ED bu declined it, states he wishes he hadn't because Tylenol & Ibuprofen not helping much.  Left arm xrayed & no Fx  Hurting on right side - rubs hand along rib cage.

## 2020-07-22 NOTE — Progress Notes (Addendum)
   Subjective: Musculoskeletal pain    Patient ID: Jon Yang, male    DOB: 03/31/1966, 54 y.o.   MRN: [id number]  HPI Patient presents with muscular pain involving the right ribs in the left arm.  Complaint is secondary to contusion by tailgate of a truck which occurred on Christmas Day.  Patient had extensive evaluation at the [facility] ED department.  Patient was cleared after obtaining CT of the chest, abdomen, and pelvis.  X-ray was negative of the right forearm.  Patient declined pain medication prescription and given the ER.Marland Kitchen  Patient stated in the past 3 days he has had increasing right rib pain.  Patient state he was given a prescription for 5 mg of Flexeril which has not helped.  Presents with a blood pressure of 201/113.  Patient states he took his blood pressure medicine just prior to arrival.  Patient rates his pain as a 10/10.  Patient described pain is "achy". Review of Systems    Asthma, hyperlipidemia, and hypertension. Objective:   Physical Exam Moderate distress.  Elevated blood pressure.  HEENT is unremarkable.  Neck is supple adenectomy or bruits.  Lungs clear to auscultation.  Patient initially splinting with inspiration.  Heart regular rate and rhythm.  No abrasion or ecchymosis to chest wall or left arm.      Assessment & Plan:  Patient given 30 mg of Toradol and will have blood pressure rechecked in 30 minutes.  Status post 30 minutes patient state decreased pain with inspiration it was noticed blood pressure has decreased to 172/108.  Patient given prescription for Toradol 10 mg 4 times daily for 5 days, Flexeril 10 mg 3 times daily for 5 days, tramadol twice daily for 3 days, and Lidoderm patches to apply 1 every 24 hours.  Patient will follow up on 28 July 2020.  Advised patient to follow-up with his PCP secondary to continue elevated blood pressures.

## 2020-07-28 ENCOUNTER — Ambulatory Visit: Payer: 59 | Admitting: Nurse Practitioner

## 2020-07-28 ENCOUNTER — Ambulatory Visit: Payer: Self-pay | Admitting: Nurse Practitioner

## 2020-07-28 ENCOUNTER — Other Ambulatory Visit: Payer: 59

## 2020-07-28 ENCOUNTER — Other Ambulatory Visit: Payer: Self-pay

## 2020-07-28 VITALS — BP 150/100 | HR 66 | Temp 98.3°F | Resp 20

## 2020-07-28 DIAGNOSIS — Z1152 Encounter for screening for COVID-19: Secondary | ICD-10-CM

## 2020-07-28 DIAGNOSIS — Z20822 Contact with and (suspected) exposure to covid-19: Secondary | ICD-10-CM

## 2020-07-28 DIAGNOSIS — R0789 Other chest pain: Secondary | ICD-10-CM

## 2020-07-28 NOTE — Progress Notes (Signed)
Subjective:    Patient ID: Jon Yang, male    DOB: 07/04/1966, 55 y.o.   MRN: [id number]  HPI  55 year old male presenting for follow up on [facility] originally seen in ED on 07/19/20.   See previous note for work injury description, fell into 5 foot hole   CT and XRAY performed in ED of chest/abdomen and pelvis, XRAY left arm Without evidence of fracture.   Today he has ongoing pain that is preventing him from sleeping. He has increased pain in right chest wall with deep breaths.   He has taken tramadol, has not taken Toradol or flexeril  Is currently using tylenol and ibuprofen for break through pain Has used lidocaine patch at night only.   Patient has a history of COPD, HTN has PCP he sees for management.   Had a stroke in July 2021 is seeing cardiology now  Wife tested positive for COVID over the weekend, patient denies symptoms  He has not been vaccinated for COVID at this time  He was at work this am  Today's Vitals   07/28/20 0918  BP: (!) 150/100  Pulse: 66  Resp: 20  Temp: 98.3 F (36.8 C)  TempSrc: Oral  SpO2: 95%   There is no height or weight on file to calculate BMI.  Review of Systems  Constitutional: Negative.   HENT: Negative.   Gastrointestinal: Negative.   Musculoskeletal: Positive for myalgias.   Past Medical History:  Diagnosis Date  . Hyperlipidemia   . Hypertension   . Stroke [facility]) 2017      Objective:   Physical Exam Cardiovascular:     Rate and Rhythm: Normal rate.     Heart sounds: Normal heart sounds.  Pulmonary:     Breath sounds: Examination of the right-upper field reveals wheezing. Examination of the left-upper field reveals wheezing. Wheezing present.     Comments: Lung sounds present throughout lung fields Musculoskeletal:        General: Tenderness present.     Comments: Strength 5/5 in bilateral upper extremities Able to shrug shoulders against resistance  Tender to right chest wall over ribs without increased pain  with compression of chest wall  When shoulders are raised pain is referred to right chest wall   Skin:    Findings: Bruising present.       Neurological:     Mental Status: He is alert and oriented to person, place, and time.           Assessment & Plan:  Discussed with patient pain management is major focus for returning to work.  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  Will f/u with patient once COVID results are returned and determine return to work based on functional capacity, pain control and COVID status.  Discussed with patient instruction on pain management as follows:  Pain management recommendations:  Ibuprofen 600mg  every 6 hours as needed for pain   Tramadol 50mg  every 12 hours as needed for pain   Lidocaine Patch may place every 6 hours as needed for pain. Place one patch at a time then discard.   Cyclobenzaprine (Flexeril ) muscle relaxer at bedtime as needed  Consistent use of inhalers as prescribed   Make appointment with primary care  to address blood pressure ASAP  Return appointment will be Wednesday or Thursday pending COVID results. RIB/ChestXRAY may be considered at that time   DO not return to work until advised by [facility].

## 2020-07-28 NOTE — Progress Notes (Signed)
Wife tested positive for covid on 07/22/20.  AMD

## 2020-07-28 NOTE — Progress Notes (Signed)
States arm has improved - very little restrictions on what he can do with it.  Right side rib area pain - Rates pain 7-8/10 when he has a flare up.  Describes as sharp knife like pain. States pain medication that Ron prescribed not helping now.  Does smoke.  Took BP medication this morning.  States didn't sleep well last night.  Was in a lot of pain & couldn't find a comfortable position to sleep.  States he's been taking intermittent ibuprofen & Tylenol.    Hasn't been taking Toradol - didn't realize it was for pain.  AMD

## 2020-07-28 NOTE — Patient Instructions (Addendum)
Pain management recommendations:  Ibuprofen 600mg  every 6 hours as needed for pain   Tramadol 50mg  every 12 hours as needed for pain   Lidocaine Patch may place every 6 hours as needed for pain. Place one patch at a time then discard.   Cyclobenzaprine (Flexeril ) muscle relaxer at bedtime as needed  Consistent use of inhalers as prescribed   Make appointment with primary care to address blood pressure ASAP  Return appointment will be Wednesday or Thursday pending COVID results. RIB/ChestXRAY may be considered at that time   DO not return to work until advised by [facility]

## 2020-07-31 LAB — NOVEL CORONAVIRUS, NAA: SARS-CoV-2, NAA: DETECTED — AB

## 2020-08-04 ENCOUNTER — Other Ambulatory Visit: Payer: Self-pay | Admitting: Physician Assistant

## 2020-08-04 DIAGNOSIS — I639 Cerebral infarction, unspecified: Secondary | ICD-10-CM

## 2020-08-06 MED ORDER — CLOPIDOGREL BISULFATE 75 MG PO TABS: 75.0000 mg | ORAL_TABLET | Freq: Every day | ORAL | 0 refills | Status: DC

## 2020-09-01 ENCOUNTER — Other Ambulatory Visit: Payer: Self-pay | Admitting: Physician Assistant

## 2020-09-03 ENCOUNTER — Other Ambulatory Visit: Payer: Self-pay | Admitting: Physician Assistant

## 2020-09-03 DIAGNOSIS — F419 Anxiety disorder, unspecified: Secondary | ICD-10-CM

## 2020-09-03 DIAGNOSIS — F32A Depression, unspecified: Secondary | ICD-10-CM

## 2020-09-04 ENCOUNTER — Other Ambulatory Visit: Payer: Self-pay

## 2020-09-04 MED ORDER — LOSARTAN POTASSIUM 50 MG PO TABS: 50.0000 mg | ORAL_TABLET | Freq: Two times a day (BID) | ORAL | 0 refills | Status: DC

## 2020-09-24 ENCOUNTER — Other Ambulatory Visit: Payer: Self-pay

## 2020-09-24 MED ORDER — CARVEDILOL 12.5 MG PO TABS: 12.5000 mg | ORAL_TABLET | Freq: Two times a day (BID) | ORAL | 0 refills | Status: DC

## 2020-10-02 ENCOUNTER — Other Ambulatory Visit: Payer: Self-pay

## 2020-10-02 MED ORDER — LOSARTAN POTASSIUM 50 MG PO TABS
50.0000 mg | ORAL_TABLET | Freq: Two times a day (BID) | ORAL | 0 refills | Status: DC
Start: 2020-10-02 — End: 2020-10-15

## 2020-10-15 ENCOUNTER — Other Ambulatory Visit: Payer: Self-pay

## 2020-10-15 MED ORDER — LOSARTAN POTASSIUM 50 MG PO TABS
50.0000 mg | ORAL_TABLET | Freq: Two times a day (BID) | ORAL | 0 refills | Status: DC
Start: 2020-10-15 — End: 2020-10-29

## 2020-10-17 ENCOUNTER — Ambulatory Visit: Payer: Self-pay | Admitting: Cardiology

## 2020-10-24 ENCOUNTER — Ambulatory Visit: Payer: Self-pay | Admitting: Physician Assistant

## 2020-10-27 ENCOUNTER — Other Ambulatory Visit: Payer: Self-pay

## 2020-10-27 ENCOUNTER — Ambulatory Visit (INDEPENDENT_AMBULATORY_CARE_PROVIDER_SITE_OTHER): Payer: 59 | Admitting: Family Medicine

## 2020-10-27 VITALS — BP 160/109 | HR 73 | Temp 98.1°F | Ht 69.0 in | Wt 232.6 lb

## 2020-10-27 DIAGNOSIS — Z8679 Personal history of other diseases of the circulatory system: Secondary | ICD-10-CM | POA: Diagnosis not present

## 2020-10-27 DIAGNOSIS — I10 Essential (primary) hypertension: Secondary | ICD-10-CM

## 2020-10-27 DIAGNOSIS — J449 Chronic obstructive pulmonary disease, unspecified: Secondary | ICD-10-CM

## 2020-10-27 MED ORDER — AMLODIPINE BESYLATE 2.5 MG PO TABS: 2.5000 mg | ORAL_TABLET | Freq: Every evening | ORAL | 1 refills | Status: DC

## 2020-10-27 NOTE — Progress Notes (Signed)
Established patient visit   Patient: Jon Yang   DOB: 1965/08/14   55 y.o. Male  MRN: [id number] Visit Date: 10/27/2020  Today's healthcare provider: Mila Merry, MD   Chief Complaint  Patient presents with  . Hypertension  . Hyperlipidemia  . COPD   Subjective    HPI  Hypertension, follow-up  BP Readings from Last 3 Encounters:  07/28/20 (!) 150/100  07/22/20 (!) 201/113  07/19/20 (!) 155/106   Wt Readings from Last 3 Encounters:  07/19/20 240 lb (108.9 kg)  06/13/20 229 lb 12.8 oz (104.2 kg)  05/02/20 231 lb 6.4 oz (105 kg)     He was last seen for hypertension 6 months ago.  BP at that visit was 156/102. Management since that visit includes none; continue current medications.  He reports excellent compliance with treatment. He is not having side effects.  He is following a Regular diet. - trying to cut back He is exercising.  - walking He does smoke.  Use of agents associated with hypertension: NSAIDS.   Outside blood pressures are n/a. Symptoms: No chest pain No chest pressure  No palpitations No syncope  No dyspnea No orthopnea  No paroxysmal nocturnal dyspnea No lower extremity edema   Pertinent labs: Lab Results  Component Value Date   CHOL 233 (H) 02/11/2020   HDL 33 (L) 02/11/2020   LDLCALC 166 (H) 02/11/2020   TRIG 169 (H) 02/11/2020   CHOLHDL 7.1 02/11/2020   Lab Results  Component Value Date   NA 136 07/19/2020   K 4.3 07/19/2020   CREATININE 0.93 07/19/2020   GFRNONAA >60 07/19/2020   GFRAA >60 02/10/2020   GLUCOSE 92 07/19/2020     The ASCVD Risk score (Goff DC Jr., et al., 2013) failed to calculate for the following reasons:   The patient has a prior MI or stroke diagnosis   --------------------------------------------------------------------------------------------------- Lipid/Cholesterol, Follow-up  Last lipid panel Other pertinent labs  Lab Results  Component Value Date   CHOL 233 (H) 02/11/2020   HDL 33 (L)  02/11/2020   LDLCALC 166 (H) 02/11/2020   TRIG 169 (H) 02/11/2020   CHOLHDL 7.1 02/11/2020   Lab Results  Component Value Date   ALT 34 07/19/2020   AST 32 07/19/2020   PLT 218 07/19/2020     He was last seen for this 6 months ago.  Management since that visit includes none; continue current medications.  He reports excellent compliance with treatment. He is not having side effects.   Symptoms: No chest pain No chest pressure/discomfort  No dyspnea No lower extremity edema  No numbness or tingling of extremity No orthopnea  No palpitations No paroxysmal nocturnal dyspnea  No speech difficulty No syncope   Current diet: in general, a "healthy" diet   Current exercise: walking  The ASCVD Risk score Denman George DC Jr., et al., 2013) failed to calculate for the following reasons:   The patient has a prior MI or stroke diagnosis  --------------------------------------------------------------------------------------------------- COPD, Follow up  He was last seen for this 9 months ago. Changes made include continue inhalers.   He reports excellent compliance with treatment. Been using inhaler PRN.  He is not having side effects.  he uses rescue inhaler 1 per weeks. He IS experiencing cough, wheezing and some sputum when coughing. He is NOT experiencing dyspnea, weight increase, weight loss, chills or fever. he reports breathing is Improved.  He states he had an injury 07/18/2020 to right side of rib cage  that still hurts (when laying down, breathing deeply, picking up heavy objects) that he would like to discuss.  Pulmonary Functions Testing Results:  No results found for: FEV1, FVC, FEV1FVC, TLC  -----------------------------------------------------------------------------------------       Medications: Outpatient Medications Prior to Visit  Medication Sig  . albuterol (VENTOLIN HFA) 108 (90 Base) MCG/ACT inhaler Inhale 2 puffs into the lungs every 6 (six) hours as needed  for wheezing or shortness of breath.  Marland Kitchen aspirin EC 81 MG EC tablet Take 1 tablet (81 mg total) by mouth daily. Swallow whole.  Marland Kitchen atorvastatin (LIPITOR) 40 MG tablet Take 1 tablet (40 mg total) by mouth daily.  . B Complex Vitamins (VITAMIN B COMPLEX PO) Take 1 tablet by mouth daily as needed.  . carvedilol (COREG) 12.5 MG tablet Take 1 tablet (12.5 mg total) by mouth 2 (two) times daily with a meal. Please schedule office visit for further refills. Thank you!  . clopidogrel (PLAVIX) 75 MG tablet Take 1 tablet (75 mg total) by mouth daily.  . cyclobenzaprine (FLEXERIL) 10 MG tablet Take 1 tablet (10 mg total) by mouth 3 (three) times daily as needed for muscle spasms. (Patient not taking: Reported on 07/28/2020)  . FLUoxetine (PROZAC) 20 MG tablet TAKE 1 TABLET BY MOUTH EVERY DAY  . fluticasone (FLONASE) 50 MCG/ACT nasal spray Place 1 spray into both nostrils 2 (two) times daily.  . Fluticasone-Umeclidin-Vilant (TRELEGY ELLIPTA) 100-62.5-25 MCG/INH AEPB INHALE 1 PUFF BY MOUTH EVERY DAY  . hydrALAZINE (APRESOLINE) 25 MG tablet Take by mouth.  Marland Kitchen ipratropium-albuterol (DUONEB) 0.5-2.5 (3) MG/3ML SOLN INHALE 3 MLS INTO THE LUNGS 4 (FOUR) TIMES DAILY AS NEEDED.  Marland Kitchen ketorolac (TORADOL) 10 MG tablet Take 1 tablet (10 mg total) by mouth every 6 (six) hours as needed. (Patient not taking: Reported on 07/28/2020)  . lidocaine (LIDODERM) 5 % Place 1 patch onto the skin daily. Remove & Discard patch within 12 hours or as directed by MD  . losartan (COZAAR) 50 MG tablet Take 1 tablet (50 mg total) by mouth in the morning and at bedtime.  Marland Kitchen Spacer/Aero-Holding Chambers (OPTICHAMBER DIAMOND-SM MASK) MISC Use with inhalers.  Marland Kitchen spironolactone (ALDACTONE) 25 MG tablet Take 25 mg by mouth daily.  . traMADol (ULTRAM) 50 MG tablet Take 1 tablet (50 mg total) by mouth every 12 (twelve) hours as needed for moderate pain.   No facility-administered medications prior to visit.        Objective    BP (!) 160/109 (BP  Location: Right Arm, Patient Position: Sitting, Cuff Size: Large)   Pulse 73   Temp 98.1 F (36.7 C) (Temporal)   Ht 5\' 9"  (1.753 m)   Wt 232 lb 9.6 oz (105.5 kg)   BMI 34.35 kg/m     Physical Exam   General appearance: Obese male, cooperative and in no acute distress Head: Normocephalic, without obvious abnormality, atraumatic Respiratory: Respirations even and unlabored, normal respiratory rate Extremities: All extremities are intact.  Skin: Skin color, texture, turgor normal. No rashes seen  Psych: Appropriate mood and affect. Neurologic: Mental status: Alert, oriented to person, place, and time, thought content appropriate.     No results found for any visits on 10/27/20.  Assessment & Plan     1. Primary hypertension Previously on 10mg  amlodipine and hydralazine, apparently stopped due to extreme fatigue. Will restart lower dose of amLODipine (NORVASC) 2.5 MG tablet; Take 1 tablet (2.5 mg total) by mouth every evening.  Dispense: 90 tablet; Refill: 1  Follow  up cardiology later this month as scheduled. Follow up here 3 months  2. Chronic obstructive pulmonary disease, unspecified COPD type (HCC) Doing well current regiment of inhalers.   3. History of cerebral hemorrhage Work on tighter blood pressure control.         The entirety of the information documented in the History of Present Illness, Review of Systems and Physical Exam were personally obtained by me. Portions of this information were initially documented by the CMA and reviewed by me for thoroughness and accuracy.  Mila Merry, MD  [facility] [phone] (phone) [phone] (fax)  [facility]

## 2020-10-28 ENCOUNTER — Other Ambulatory Visit: Payer: Self-pay | Admitting: *Deleted

## 2020-10-28 MED ORDER — CARVEDILOL 12.5 MG PO TABS: 12.5000 mg | ORAL_TABLET | Freq: Two times a day (BID) | ORAL | 0 refills | Status: DC

## 2020-10-29 ENCOUNTER — Other Ambulatory Visit: Payer: Self-pay | Admitting: *Deleted

## 2020-10-29 MED ORDER — LOSARTAN POTASSIUM 50 MG PO TABS: 50.0000 mg | ORAL_TABLET | Freq: Two times a day (BID) | ORAL | 0 refills | Status: DC

## 2020-10-29 NOTE — Telephone Encounter (Signed)
Received request from pharmacy stating patient would like #90 day supply of Losartan, rather than #30. New prescription for #90 sent to pharmacy on file per request.

## 2020-10-30 ENCOUNTER — Other Ambulatory Visit: Payer: Self-pay | Admitting: Physician Assistant

## 2020-10-30 DIAGNOSIS — J449 Chronic obstructive pulmonary disease, unspecified: Secondary | ICD-10-CM

## 2020-11-10 ENCOUNTER — Ambulatory Visit: Payer: Self-pay | Admitting: Cardiology

## 2020-11-12 ENCOUNTER — Other Ambulatory Visit: Payer: Self-pay

## 2020-11-12 MED ORDER — CARVEDILOL 12.5 MG PO TABS: 12.5000 mg | ORAL_TABLET | Freq: Two times a day (BID) | ORAL | 0 refills | Status: DC

## 2020-11-12 MED ORDER — LOSARTAN POTASSIUM 50 MG PO TABS: 50.0000 mg | ORAL_TABLET | Freq: Two times a day (BID) | ORAL | 0 refills | Status: DC

## 2020-12-08 ENCOUNTER — Telehealth: Payer: Self-pay | Admitting: Cardiology

## 2020-12-08 ENCOUNTER — Encounter: Payer: Self-pay | Admitting: Family Medicine

## 2020-12-08 DIAGNOSIS — I639 Cerebral infarction, unspecified: Secondary | ICD-10-CM

## 2020-12-08 NOTE — Telephone Encounter (Signed)
-----   Message from Andi Devon sent at 12/08/2020  9:28 AM EDT ----- Regarding: needs appointment Please schedule overdue F/U appointment for refills. Thank you!

## 2020-12-08 NOTE — Telephone Encounter (Signed)
Attempted to schedule.  LMOV to call office.  ° °

## 2020-12-09 NOTE — Telephone Encounter (Signed)
Is Plavix supposed to be managed by Cardiology? Patient is requesting a refill. Please advise.

## 2020-12-11 MED ORDER — CLOPIDOGREL BISULFATE 75 MG PO TABS: 75.0000 mg | ORAL_TABLET | Freq: Every day | ORAL | 1 refills | Status: DC

## 2020-12-23 ENCOUNTER — Encounter: Payer: Self-pay | Admitting: Family Medicine

## 2020-12-23 DIAGNOSIS — J449 Chronic obstructive pulmonary disease, unspecified: Secondary | ICD-10-CM

## 2020-12-23 MED ORDER — TRELEGY ELLIPTA 100-62.5-25 MCG/INH IN AEPB: INHALATION_SPRAY | RESPIRATORY_TRACT | 2 refills | Status: DC

## 2021-01-13 ENCOUNTER — Telehealth: Payer: Self-pay

## 2021-01-13 DIAGNOSIS — M79673 Pain in unspecified foot: Secondary | ICD-10-CM

## 2021-01-13 NOTE — Telephone Encounter (Signed)
Order placed

## 2021-01-13 NOTE — Telephone Encounter (Signed)
Copied from CRM [id number]. Topic: Appointment Scheduling - Scheduling Inquiry for Clinic >> Jan 13, 2021  2:14 PM Jon Yang wrote: Reason for CRM patient experiencing left heel pain, any cancellations please follow up with patient as soon as possible. Patient states if appointment is not necessary he would like a referral placed with [facility] Dr. Al Corpus.

## 2021-01-21 ENCOUNTER — Encounter: Payer: Self-pay | Admitting: Podiatry

## 2021-01-21 ENCOUNTER — Other Ambulatory Visit: Payer: Self-pay

## 2021-01-21 ENCOUNTER — Ambulatory Visit (INDEPENDENT_AMBULATORY_CARE_PROVIDER_SITE_OTHER): Payer: 59

## 2021-01-21 ENCOUNTER — Ambulatory Visit (INDEPENDENT_AMBULATORY_CARE_PROVIDER_SITE_OTHER): Payer: 59 | Admitting: Podiatry

## 2021-01-21 DIAGNOSIS — M722 Plantar fascial fibromatosis: Secondary | ICD-10-CM

## 2021-01-21 MED ORDER — TRIAMCINOLONE ACETONIDE 40 MG/ML IJ SUSP
20.0000 mg | Freq: Once | INTRAMUSCULAR | Status: AC
  Administered 2021-01-21: 20 mg

## 2021-01-21 MED ORDER — MELOXICAM 15 MG PO TABS: 15.0000 mg | ORAL_TABLET | Freq: Every day | ORAL | 3 refills | Status: DC

## 2021-01-21 MED ORDER — METHYLPREDNISOLONE 4 MG PO TBPK: ORAL_TABLET | ORAL | 0 refills | Status: DC

## 2021-01-21 NOTE — Patient Instructions (Signed)

## 2021-01-21 NOTE — Progress Notes (Signed)
Subjective:  Patient ID: Jon Yang, male    DOB: 09/20/1965,  MRN: [id number] HPI Chief Complaint  Patient presents with   Foot Pain    Plantar heel left - aching x 4 months, AM pain, sharp sensations, tried Ibuprofen PRN   New Patient (Initial Visit)    55 y.o. male presents with the above complaint.   ROS: Denies fever chills nausea vomiting muscle aches pains calf pain back pain chest pain shortness of breath.  Past Medical History:  Diagnosis Date   Hyperlipidemia    Hypertension    Stroke (HCC) 2017   Past Surgical History:  Procedure Laterality Date   NO PAST SURGERIES      Current Outpatient Medications:    Cholecalciferol (D3-1000) 25 MCG (1000 UT) tablet, , Disp: , Rfl:    meloxicam (MOBIC) 15 MG tablet, Take 1 tablet (15 mg total) by mouth daily., Disp: 30 tablet, Rfl: 3   methylPREDNISolone (MEDROL DOSEPAK) 4 MG TBPK tablet, 6 day dose pack - take as directed, Disp: 21 tablet, Rfl: 0   albuterol (VENTOLIN HFA) 108 (90 Base) MCG/ACT inhaler, Inhale 2 puffs into the lungs every 6 (six) hours as needed for wheezing or shortness of breath., Disp: 8 g, Rfl: 3   amLODipine (NORVASC) 2.5 MG tablet, Take 1 tablet (2.5 mg total) by mouth every evening., Disp: 90 tablet, Rfl: 1   aspirin EC 81 MG EC tablet, Take 1 tablet (81 mg total) by mouth daily. Swallow whole., Disp: 30 tablet, Rfl: 11   atorvastatin (LIPITOR) 40 MG tablet, Take 1 tablet (40 mg total) by mouth daily., Disp: 30 tablet, Rfl: 11   B Complex Vitamins (VITAMIN B COMPLEX PO), Take 1 tablet by mouth daily as needed., Disp: , Rfl:    carvedilol (COREG) 12.5 MG tablet, Take 1 tablet (12.5 mg total) by mouth 2 (two) times daily with a meal. Please contact our office for a follow up appointment for further refills., Disp: 60 tablet, Rfl: 0   clopidogrel (PLAVIX) 75 MG tablet, Take 1 tablet (75 mg total) by mouth daily., Disp: 30 tablet, Rfl: 1   FLUoxetine (PROZAC) 20 MG tablet, TAKE 1 TABLET BY MOUTH EVERY DAY,  Disp: 90 tablet, Rfl: 0   fluticasone (FLONASE) 50 MCG/ACT nasal spray, Place 1 spray into both nostrils 2 (two) times daily., Disp: , Rfl: 0   Fluticasone-Umeclidin-Vilant (TRELEGY ELLIPTA) 100-62.5-25 MCG/INH AEPB, INHALE 1 PUFF BY MOUTH EVERY DAY, Disp: 60 each, Rfl: 2   ipratropium-albuterol (DUONEB) 0.5-2.5 (3) MG/3ML SOLN, INHALE 3 MLS INTO THE LUNGS 4 (FOUR) TIMES DAILY AS NEEDED., Disp: 360 mL, Rfl: 1   losartan (COZAAR) 50 MG tablet, Take 1 tablet (50 mg total) by mouth in the morning and at bedtime., Disp: 60 tablet, Rfl: 0   Spacer/Aero-Holding Chambers (OPTICHAMBER DIAMOND-SM MASK) MISC, Use with inhalers., Disp: , Rfl:    spironolactone (ALDACTONE) 25 MG tablet, Take 25 mg by mouth daily., Disp: , Rfl:   Allergies  Allergen Reactions   Shellfish Allergy     Other reaction(s): Other (See Comments) Feels drunk Lightheaded   Review of Systems Objective:  There were no vitals filed for this visit.  General: Well developed, nourished, in no acute distress, alert and oriented x3   Dermatological: Skin is warm, dry and supple bilateral. Nails x 10 are well maintained; remaining integument appears unremarkable at this time. There are no open sores, no preulcerative lesions, no rash or signs of infection present.  Vascular: Dorsalis Pedis artery and  Posterior Tibial artery pedal pulses are 2/4 bilateral with immedate capillary fill time. Pedal hair growth present. No varicosities and no lower extremity edema present bilateral.   Neruologic: Grossly intact via light touch bilateral. Vibratory intact via tuning fork bilateral. Protective threshold with Semmes Wienstein monofilament intact to all pedal sites bilateral. Patellar and Achilles deep tendon reflexes 2+ bilateral. No Babinski or clonus noted bilateral.   Musculoskeletal: No gross boney pedal deformities bilateral. No pain, crepitus, or limitation noted with foot and ankle range of motion bilateral. Muscular strength 5/5 in all  groups tested bilateral.  Pain on palpation medial calcaneal tubercle of the left heel.  Gait: Unassisted, Nonantalgic.    Radiographs:  Radiographs taken today demonstrate soft tissue increase in density plantar fascial pain insertion site of the left foot also demonstrates retention of foreign body just lateral to the mid diaphyseal region of the second metatarsal.  No acute findings are noted.  Assessment & Plan:   Assessment: Planter fasciitis left heel.  Plan: Discussed etiology pathology conservative versus surgical therapies.  Patient informed me that he was no longer taking any of his prescription medication other than that that for COPD.  So I was able to start him on meloxicam and start him on a Medrol Dosepak also injected the left heel and placed him in a plantar fascial brace we did discuss the need for appropriate shoe gear stretching exercises and ice therapy.  I will follow-up with him in 1 month.     Morna Flud T. [place], [place]

## 2021-01-30 NOTE — Telephone Encounter (Signed)
LMOV to schedule  

## 2021-02-02 ENCOUNTER — Other Ambulatory Visit: Payer: Self-pay | Admitting: Family Medicine

## 2021-02-02 ENCOUNTER — Other Ambulatory Visit: Payer: Self-pay

## 2021-02-02 DIAGNOSIS — I10 Essential (primary) hypertension: Secondary | ICD-10-CM

## 2021-02-03 NOTE — Telephone Encounter (Signed)
Pt overdue for 4 wk f/u. Pt last seen 03/2020. Please contact pt for future appointment. Pt needing refills.

## 2021-02-04 ENCOUNTER — Telehealth: Payer: Self-pay

## 2021-02-04 ENCOUNTER — Other Ambulatory Visit: Payer: Self-pay | Admitting: *Deleted

## 2021-02-04 DIAGNOSIS — J449 Chronic obstructive pulmonary disease, unspecified: Secondary | ICD-10-CM

## 2021-02-04 MED ORDER — IPRATROPIUM-ALBUTEROL 0.5-2.5 (3) MG/3ML IN SOLN
3.0000 mL | Freq: Four times a day (QID) | RESPIRATORY_TRACT | 1 refills | Status: DC | PRN
Start: 2021-02-04 — End: 2021-04-13

## 2021-02-04 MED ORDER — CARVEDILOL 12.5 MG PO TABS: 12.5000 mg | ORAL_TABLET | Freq: Two times a day (BID) | ORAL | 0 refills | Status: DC

## 2021-02-04 NOTE — Telephone Encounter (Signed)
[facility] faxed refill request for the following medications:  ipratropium-albuterol (DUONEB) 0.5-2.5 (3) MG/3ML SOLN   Please advise.

## 2021-02-06 ENCOUNTER — Encounter: Payer: Self-pay | Admitting: Family Medicine

## 2021-02-09 ENCOUNTER — Ambulatory Visit (INDEPENDENT_AMBULATORY_CARE_PROVIDER_SITE_OTHER): Payer: 59 | Admitting: Family Medicine

## 2021-02-09 ENCOUNTER — Encounter: Payer: Self-pay | Admitting: Family Medicine

## 2021-02-09 ENCOUNTER — Other Ambulatory Visit: Payer: Self-pay

## 2021-02-09 VITALS — BP 155/94 | HR 79 | Temp 98.8°F | Resp 16 | Ht 70.0 in | Wt 233.0 lb

## 2021-02-09 DIAGNOSIS — Z125 Encounter for screening for malignant neoplasm of prostate: Secondary | ICD-10-CM

## 2021-02-09 DIAGNOSIS — Z1159 Encounter for screening for other viral diseases: Secondary | ICD-10-CM | POA: Diagnosis not present

## 2021-02-09 DIAGNOSIS — I10 Essential (primary) hypertension: Secondary | ICD-10-CM

## 2021-02-09 DIAGNOSIS — E785 Hyperlipidemia, unspecified: Secondary | ICD-10-CM

## 2021-02-09 MED ORDER — AMLODIPINE BESYLATE 2.5 MG PO TABS: 5.0000 mg | ORAL_TABLET | Freq: Every evening | ORAL | Status: DC

## 2021-02-09 NOTE — Patient Instructions (Addendum)
Increase amlodipine to 2 (two)  of the 2.5mg  tablets once a day. Your next prescription will be for 5mg  tablets, so you'll only need to take one a day when you get it filled.

## 2021-02-09 NOTE — Progress Notes (Signed)
Established patient visit   Patient: Jon Yang   DOB: 26-May-1966   55 y.o. Male  MRN: [id number] Visit Date: 02/09/2021  Today's healthcare provider: Mila Merry, MD   Chief Complaint  Patient presents with   Hypertension   COPD   Subjective    HPI  Hypertension, follow-up  BP Readings from Last 3 Encounters:  02/09/21 (!) 155/94  10/27/20 (!) 160/109  07/28/20 (!) 150/100   Wt Readings from Last 3 Encounters:  02/09/21 233 lb (105.7 kg)  10/27/20 232 lb 9.6 oz (105.5 kg)  07/19/20 240 lb (108.9 kg)     He was last seen for hypertension 3 months ago.  BP at that visit was 160/109. Management since that visit includes restarting on amlodipine 2.5mg  daily.  He reports good compliance with treatment. He is not having side effects.  He is following a Regular diet. He is not exercising. He does smoke.  Use of agents associated with hypertension: none.   Outside blood pressures are checked occasionally. Symptoms: No chest pain No chest pressure  No palpitations No syncope  No dyspnea No orthopnea  No paroxysmal nocturnal dyspnea No lower extremity edema   Pertinent labs: Lab Results  Component Value Date   CHOL 233 (H) 02/11/2020   HDL 33 (L) 02/11/2020   LDLCALC 166 (H) 02/11/2020   TRIG 169 (H) 02/11/2020   CHOLHDL 7.1 02/11/2020   Lab Results  Component Value Date   NA 136 07/19/2020   K 4.3 07/19/2020   CREATININE 0.93 07/19/2020   GFRNONAA >60 07/19/2020   GFRAA >60 02/10/2020   GLUCOSE 92 07/19/2020     The ASCVD Risk score (Goff DC Jr., et al., 2013) failed to calculate for the following reasons:   The patient has a prior MI or stroke diagnosis   COPD, Follow up  He was last seen for this 3 months ago. Changes made include no medication changes.   He reports good compliance with treatment. He is not having side effects.  he uses rescue inhaler 1 per months.  He is NOT experiencing dyspnea or wheezing. he reports breathing is  Improved.     Medications: Outpatient Medications Prior to Visit  Medication Sig   albuterol (VENTOLIN HFA) 108 (90 Base) MCG/ACT inhaler Inhale 2 puffs into the lungs every 6 (six) hours as needed for wheezing or shortness of breath.   amLODipine (NORVASC) 2.5 MG tablet Take 1 tablet (2.5 mg total) by mouth every evening.   aspirin EC 81 MG EC tablet Take 1 tablet (81 mg total) by mouth daily. Swallow whole.   atorvastatin (LIPITOR) 40 MG tablet Take 1 tablet (40 mg total) by mouth daily.   B Complex Vitamins (VITAMIN B COMPLEX PO) Take 1 tablet by mouth daily as needed.   carvedilol (COREG) 12.5 MG tablet Take 1 tablet (12.5 mg total) by mouth 2 (two) times daily with a meal.   Cholecalciferol (D3-1000) 25 MCG (1000 UT) tablet    clopidogrel (PLAVIX) 75 MG tablet Take 1 tablet (75 mg total) by mouth daily.   FLUoxetine (PROZAC) 20 MG tablet TAKE 1 TABLET BY MOUTH EVERY DAY   fluticasone (FLONASE) 50 MCG/ACT nasal spray Place 1 spray into both nostrils 2 (two) times daily.   Fluticasone-Umeclidin-Vilant (TRELEGY ELLIPTA) 100-62.5-25 MCG/INH AEPB INHALE 1 PUFF BY MOUTH EVERY DAY   ipratropium-albuterol (DUONEB) 0.5-2.5 (3) MG/3ML SOLN Inhale 3 mLs into the lungs 4 (four) times daily as needed.   losartan (COZAAR) 50  MG tablet Take 1 tablet (50 mg total) by mouth in the morning and at bedtime.   meloxicam (MOBIC) 15 MG tablet Take 1 tablet (15 mg total) by mouth daily.   Spacer/Aero-Holding Chambers (OPTICHAMBER DIAMOND-SM MASK) MISC Use with inhalers.   spironolactone (ALDACTONE) 25 MG tablet Take 25 mg by mouth daily.   methylPREDNISolone (MEDROL DOSEPAK) 4 MG TBPK tablet 6 day dose pack - take as directed (Patient not taking: Reported on 02/09/2021)   No facility-administered medications prior to visit.    Review of Systems  Constitutional:  Negative for activity change and fatigue.  Respiratory:  Negative for cough and shortness of breath.   Cardiovascular:  Negative for chest pain,  palpitations and leg swelling.  Endocrine: Negative for cold intolerance, heat intolerance, polydipsia, polyphagia and polyuria.  Musculoskeletal:  Positive for arthralgias. Negative for myalgias.  Neurological:  Negative for dizziness, light-headedness and headaches.  Psychiatric/Behavioral:  Negative for self-injury, sleep disturbance and suicidal ideas. The patient is not nervous/anxious.       Objective    BP (!) 155/94   Pulse 79   Temp 98.8 F (37.1 C)   Resp 16   Ht 5\' 10"  (1.778 m)   Wt 233 lb (105.7 kg)   BMI 33.43 kg/m     Physical Exam   General: Appearance:    Obese male in no acute distress  Eyes:    PERRL, conjunctiva/corneas clear, EOM's intact       Lungs:     Clear to auscultation bilaterally, respirations unlabored  Heart:    Normal heart rate. Normal rhythm. No murmurs, rubs, or gallops.    MS:   All extremities are intact.    Neurologic:   Awake, alert, oriented x 3. No apparent focal neurological defect.        Assessment & Plan     1. Primary hypertension Improved but not at goal with addition of low dose amlodipine.  - CBC  Increase to  amLODipine (NORVASC) 2.5 MG tablet; Take 2 tablets (5 mg total) by mouth every evening.  He had 90 tablets dispensed 2 weeks ago. Will sent 4x for 5mg  tablets to his pharmacy in a month.   2. Hyperlipidemia, unspecified hyperlipidemia type He is tolerating atorvastatin well with no adverse effects.   - Comprehensive metabolic panel - Lipid panel  3. Prostate cancer screening  - PSA Total (Reflex To Free)  4. Need for hepatitis C screening test  - Hepatitis C antibody       The entirety of the information documented in the History of Present Illness, Review of Systems and Physical Exam were personally obtained by me. Portions of this information were initially documented by the CMA and reviewed by me for thoroughness and accuracy.     , MD  [facility] [phone]  (phone) [phone] (fax)  [facility]

## 2021-02-10 LAB — COMPREHENSIVE METABOLIC PANEL
ALT: 37 IU/L (ref 0–44)
AST: 28 IU/L (ref 0–40)
Albumin/Globulin Ratio: 1.6 (ref 1.2–2.2)
Albumin: 4.6 g/dL (ref 3.8–4.9)
Alkaline Phosphatase: 100 IU/L (ref 44–121)
BUN/Creatinine Ratio: 17 (ref 9–20)
BUN: 23 mg/dL (ref 6–24)
Bilirubin Total: 1.1 mg/dL (ref 0.0–1.2)
CO2: 23 mmol/L (ref 20–29)
Calcium: 10.5 mg/dL — ABNORMAL HIGH (ref 8.7–10.2)
Chloride: 101 mmol/L (ref 96–106)
Creatinine, Ser: 1.34 mg/dL — ABNORMAL HIGH (ref 0.76–1.27)
Globulin, Total: 2.9 g/dL (ref 1.5–4.5)
Glucose: 79 mg/dL (ref 65–99)
Potassium: 4.9 mmol/L (ref 3.5–5.2)
Sodium: 140 mmol/L (ref 134–144)
Total Protein: 7.5 g/dL (ref 6.0–8.5)
eGFR: 63 mL/min/{1.73_m2} (ref >59)

## 2021-02-10 LAB — CBC
Hematocrit: 54.9 % — ABNORMAL HIGH (ref 37.5–51.0)
Hemoglobin: 19.1 g/dL — ABNORMAL HIGH (ref 13.0–17.7)
MCH: 31.7 pg (ref 26.6–33.0)
MCHC: 34.8 g/dL (ref 31.5–35.7)
MCV: 91 fL (ref 79–97)
Platelets: 216 10*3/uL (ref 150–450)
RBC: 6.02 x10E6/uL — ABNORMAL HIGH (ref 4.14–5.80)
RDW: 13 % (ref 11.6–15.4)
WBC: 14 10*3/uL — ABNORMAL HIGH (ref 3.4–10.8)

## 2021-02-10 LAB — LIPID PANEL
Chol/HDL Ratio: 6 ratio — ABNORMAL HIGH (ref 0.0–5.0)
Cholesterol, Total: 251 mg/dL — ABNORMAL HIGH (ref 100–199)
HDL: 42 mg/dL (ref >39)
LDL Chol Calc (NIH): 172 mg/dL — ABNORMAL HIGH (ref 0–99)
Triglycerides: 196 mg/dL — ABNORMAL HIGH (ref 0–149)
VLDL Cholesterol Cal: 37 mg/dL (ref 5–40)

## 2021-02-10 LAB — PSA TOTAL (REFLEX TO FREE): Prostate Specific Ag, Serum: 1.5 ng/mL (ref 0.0–4.0)

## 2021-02-10 LAB — HEPATITIS C ANTIBODY: Hep C Virus Ab: <0.1 s/co ratio (ref 0.0–0.9)

## 2021-02-16 ENCOUNTER — Other Ambulatory Visit: Payer: Self-pay

## 2021-02-16 NOTE — Telephone Encounter (Signed)
Pt advised.  He is taking amlodipine 5mg  daily.  He is not sure why is stopped atorvastatin but agreed to restart.      Thanks,   - 

## 2021-02-16 NOTE — Telephone Encounter (Signed)
-----   Message from Malva Limes, MD sent at 02/11/2021 12:37 PM EDT ----- Has high blood cell count which is a result of smoking and presents high risk for blood clots and strokes. Needs to stop smoking.  Cholesterol is much too high at 251 which also will likely cause another stroke. He either needs to start back on atorvastatin or we can change to a different cholesterol medication if he had problems with atorvastatin Kidney functions have deteriorated. He needs to drink more water and get blood pressure under control. Was advised at o.v. to double dose of amlodipine to two a day.

## 2021-02-23 ENCOUNTER — Encounter: Payer: 59 | Admitting: Podiatry

## 2021-03-04 ENCOUNTER — Other Ambulatory Visit: Payer: Self-pay

## 2021-03-04 MED ORDER — CARVEDILOL 12.5 MG PO TABS: 12.5000 mg | ORAL_TABLET | Freq: Two times a day (BID) | ORAL | 0 refills | Status: DC

## 2021-03-05 ENCOUNTER — Other Ambulatory Visit: Payer: Self-pay | Admitting: Family Medicine

## 2021-03-05 DIAGNOSIS — I10 Essential (primary) hypertension: Secondary | ICD-10-CM

## 2021-03-05 MED ORDER — AMLODIPINE BESYLATE 5 MG PO TABS: 5.0000 mg | ORAL_TABLET | Freq: Every evening | ORAL | 4 refills | Status: DC

## 2021-03-10 ENCOUNTER — Emergency Department
Admission: EM | Admit: 2021-03-10 | Discharge: 2021-03-10 | Disposition: A | Discharge status: Home / Self Care | Payer: No Typology Code available for payment source | Attending: Emergency Medicine | Admitting: Emergency Medicine

## 2021-03-10 ENCOUNTER — Emergency Department: Payer: No Typology Code available for payment source

## 2021-03-10 ENCOUNTER — Other Ambulatory Visit: Payer: Self-pay

## 2021-03-10 ENCOUNTER — Encounter: Payer: Self-pay | Admitting: Emergency Medicine

## 2021-03-10 DIAGNOSIS — J449 Chronic obstructive pulmonary disease, unspecified: Secondary | ICD-10-CM | POA: Diagnosis not present

## 2021-03-10 DIAGNOSIS — Z79899 Other long term (current) drug therapy: Secondary | ICD-10-CM | POA: Insufficient documentation

## 2021-03-10 DIAGNOSIS — Y9389 Activity, other specified: Secondary | ICD-10-CM | POA: Insufficient documentation

## 2021-03-10 DIAGNOSIS — S0031XA Abrasion of nose, initial encounter: Secondary | ICD-10-CM | POA: Diagnosis not present

## 2021-03-10 DIAGNOSIS — W228XXA Striking against or struck by other objects, initial encounter: Secondary | ICD-10-CM | POA: Diagnosis not present

## 2021-03-10 DIAGNOSIS — S0083XA Contusion of other part of head, initial encounter: Secondary | ICD-10-CM | POA: Diagnosis not present

## 2021-03-10 DIAGNOSIS — Z7951 Long term (current) use of inhaled steroids: Secondary | ICD-10-CM | POA: Insufficient documentation

## 2021-03-10 DIAGNOSIS — S0993XA Unspecified injury of face, initial encounter: Secondary | ICD-10-CM | POA: Diagnosis present

## 2021-03-10 DIAGNOSIS — Y990 Civilian activity done for income or pay: Secondary | ICD-10-CM | POA: Diagnosis not present

## 2021-03-10 DIAGNOSIS — S022XXA Fracture of nasal bones, initial encounter for closed fracture: Secondary | ICD-10-CM | POA: Diagnosis not present

## 2021-03-10 DIAGNOSIS — Z8673 Personal history of transient ischemic attack (TIA), and cerebral infarction without residual deficits: Secondary | ICD-10-CM | POA: Diagnosis not present

## 2021-03-10 DIAGNOSIS — Z7982 Long term (current) use of aspirin: Secondary | ICD-10-CM | POA: Diagnosis not present

## 2021-03-10 DIAGNOSIS — Z7902 Long term (current) use of antithrombotics/antiplatelets: Secondary | ICD-10-CM | POA: Insufficient documentation

## 2021-03-10 DIAGNOSIS — F17210 Nicotine dependence, cigarettes, uncomplicated: Secondary | ICD-10-CM | POA: Insufficient documentation

## 2021-03-10 DIAGNOSIS — I10 Essential (primary) hypertension: Secondary | ICD-10-CM | POA: Insufficient documentation

## 2021-03-10 MED ORDER — BACITRACIN ZINC 500 UNIT/GM EX OINT: TOPICAL_OINTMENT | Freq: Two times a day (BID) | CUTANEOUS | Status: DC

## 2021-03-10 MED ORDER — TRAMADOL HCL 50 MG PO TABS: 50.0000 mg | ORAL_TABLET | Freq: Four times a day (QID) | ORAL | 0 refills | Status: DC | PRN

## 2021-03-10 MED ORDER — ACETAMINOPHEN 500 MG PO TABS
1000.0000 mg | ORAL_TABLET | Freq: Once | ORAL | Status: AC
  Administered 2021-03-10: 1000 mg via ORAL
  Filled 2021-03-10: qty 2

## 2021-03-10 NOTE — Discharge Instructions (Addendum)
Please take Tylenol as needed for mild to moderate pain and tramadol for moderate to severe pain.  Apply ice to the face 20 minutes every hour for the remainder of the day.  Call ENT office tomorrow to schedule a follow-up appointment in the next few days.  Return to the ER for any difficulty breathing, worsening pain, swelling bleeding or any urgent changes in your health.

## 2021-03-10 NOTE — ED Notes (Signed)
Pt in CT.

## 2021-03-10 NOTE — ED Triage Notes (Signed)
Pt reports was hit in the face by a tree at work and was sent to UC. UC sent pt to the ED for a CT scan. Pt reports no LOC but was dazed and confused afterwards.

## 2021-03-10 NOTE — ED Notes (Signed)
Pt signed esignature.  D/c  inst to pt.  

## 2021-03-10 NOTE — ED Provider Notes (Signed)
[facility] EMERGENCY DEPARTMENT Provider Note   CSN: [id number] Arrival date & time: 03/10/21  1606     History Chief Complaint  Patient presents with   Facial Injury    Jon Yang is a 55 y.o. male presents to the emergency department evaluation of a facial injury.  Patient was at work earlier today when he was cutting trees with a chainsaw and a tree kicked back and hit him in the nose.  He developed pain and swelling to the bridge of the nose.  He denies any headache, LOC, nausea or vomiting.  No vision changes.  Denies any nosebleeds or difficulty breathing through his nose.  He has had some abrasions to the bridge of the nose.  Pain is moderate.  He has not any medications for pain.  His tetanus is up-to-date.  HPI     Past Medical History:  Diagnosis Date   Hyperlipidemia    Hypertension    Stroke [facility]) 2017    Patient Active Problem List   Diagnosis Date Noted   Heterozygous factor V Leiden mutation (HCC) 03/04/2020   Aphasia    Hyperlipidemia 10/25/2019   History of hemorrhagic stroke with residual hemiparesis (HCC) 03/06/2019   Thrombus of pulmonary vein (HCC) 11/21/2017   Migraines 11/03/2017   Headache 10/21/2017   COPD (chronic obstructive pulmonary disease) (HCC) 08/09/2017   Bronchitis 08/09/2017   Hypertension 11/10/2015   History of cerebral hemorrhage 11/10/2015   Nontraumatic subcortical hemorrhage of right cerebral hemisphere (HCC) 11/10/2015   Tobacco abuse 11/10/2015    Past Surgical History:  Procedure Laterality Date   NO PAST SURGERIES         Family History  Problem Relation Age of Onset   Healthy Mother    Lung cancer Father    Prostate cancer Father    Healthy Sister    Hypertension Brother    Healthy Sister     Social History   Tobacco Use   Smoking status: Every Day    Packs/day: 1.00    Types: Cigarettes   Smokeless tobacco: Never  Vaping Use   Vaping Use: Never used  Substance Use Topics    Alcohol use: No   Drug use: No    Home Medications Prior to Admission medications   Medication Sig Start Date End Date Taking? Authorizing Provider  traMADol (ULTRAM) 50 MG tablet Take 1 tablet (50 mg total) by mouth every 6 (six) hours as needed. 03/10/21 03/10/22 Yes Evon Slack, PA-C  albuterol (VENTOLIN HFA) 108 (90 Base) MCG/ACT inhaler Inhale 2 puffs into the lungs every 6 (six) hours as needed for wheezing or shortness of breath. 11/16/19   Trey Sailors, PA-C  amLODipine (NORVASC) 5 MG tablet Take 1 tablet (5 mg total) by mouth every evening. PLEASE NOTE CHANGE TO 5MG  TABLETS 03/05/21   05/05/21, MD  aspirin EC 81 MG EC tablet Take 1 tablet (81 mg total) by mouth daily. Swallow whole. 02/12/20   02/14/20, MD  atorvastatin (LIPITOR) 40 MG tablet Take 1 tablet (40 mg total) by mouth daily. 02/11/20 02/10/21  02/12/21, MD  B Complex Vitamins (VITAMIN B COMPLEX PO) Take 1 tablet by mouth daily as needed.    [provider]  carvedilol (COREG) 12.5 MG tablet Take 1 tablet (12.5 mg total) by mouth 2 (two) times daily with a meal. 03/04/21   05/04/21, MD  Cholecalciferol (D3-1000) 25 MCG (1000 UT) tablet  07/26/20   [provider]  clopidogrel (PLAVIX) 75 MG tablet Take 1 tablet (75 mg total) by mouth daily. 12/11/20   Debbe Odea, MD  FLUoxetine (PROZAC) 20 MG tablet TAKE 1 TABLET BY MOUTH EVERY DAY 09/03/20   Trey Sailors, PA-C  fluticasone (FLONASE) 50 MCG/ACT nasal spray Place 1 spray into both nostrils 2 (two) times daily. 01/24/17   [provider]  Fluticasone-Umeclidin-Vilant (TRELEGY ELLIPTA) 100-62.5-25 MCG/INH AEPB INHALE 1 PUFF BY MOUTH EVERY DAY 12/23/20   Malva Limes, MD  ipratropium-albuterol (DUONEB) 0.5-2.5 (3) MG/3ML SOLN Inhale 3 mLs into the lungs 4 (four) times daily as needed. 02/04/21   Malva Limes, MD  losartan (COZAAR) 50 MG tablet Take 1 tablet (50 mg total) by mouth in the morning and at bedtime.  11/12/20 02/10/21  Debbe Odea, MD  meloxicam (MOBIC) 15 MG tablet Take 1 tablet (15 mg total) by mouth daily. 01/21/21   Hyatt, Max T, DPM  Spacer/Aero-Holding Chambers (OPTICHAMBER DIAMOND-SM MASK) MISC Use with inhalers. 08/11/17   [provider]  spironolactone (ALDACTONE) 25 MG tablet Take 25 mg by mouth daily. 06/24/20   [provider]    Allergies    Shellfish allergy  Review of Systems   Review of Systems  Constitutional:  Negative for chills and fever.  HENT:  Positive for facial swelling. Negative for ear pain and trouble swallowing.   Eyes:  Negative for photophobia and pain.  Respiratory:  Negative for shortness of breath.   Cardiovascular:  Negative for chest pain.  Gastrointestinal:  Negative for abdominal pain, nausea and vomiting.  Skin:  Positive for wound.  Neurological:  Negative for dizziness, light-headedness, numbness and headaches.   Physical Exam Updated Vital Signs BP (!) 179/118 (BP Location: Right Arm)   Pulse 70   Temp 97.8 F (36.6 C) (Oral)   Resp 20   Ht 5\' 9"  (1.753 m)   Wt 107.5 kg   SpO2 95%   BMI 35.00 kg/m   Physical Exam Constitutional:      Appearance: He is well-developed.  HENT:     Head: Normocephalic and atraumatic.     Ears:     Comments: Bridge of the nose with superficial abrasion x2 with soft tissue swelling.  No lacerations.  No significant deformity.  No signs of septal hematoma.  No active intranasal bleeding. Eyes:     Conjunctiva/sclera: Conjunctivae normal.  Cardiovascular:     Rate and Rhythm: Normal rate.  Pulmonary:     Effort: Pulmonary effort is normal. No respiratory distress.  Musculoskeletal:        General: Normal range of motion.     Cervical back: Normal range of motion.  Skin:    General: Skin is warm.     Findings: No rash.  Neurological:     Mental Status: He is alert and oriented to person, place, and time.  Psychiatric:        Behavior: Behavior normal.        Thought  Content: Thought content normal.    ED Results / Procedures / Treatments   Labs (all labs ordered are listed, but only abnormal results are displayed) Labs Reviewed - No data to display  EKG None  Radiology CT HEAD WO CONTRAST ( )  Result Date: 03/10/2021 CLINICAL DATA:  Facial injury, trauma, altered mental status EXAM: CT HEAD WITHOUT CONTRAST CT MAXILLOFACIAL WITHOUT CONTRAST TECHNIQUE: Multidetector CT imaging of the head and maxillofacial structures were performed using the standard protocol without intravenous contrast. Multiplanar CT  image reconstructions of the maxillofacial structures were also generated. COMPARISON:  02/10/2020 FINDINGS: CT HEAD FINDINGS Brain: Limited with some motion artifact. Some of the images were repeated. No acute intracranial hemorrhage, mass lesion, acute infarction, midline shift, herniation, hydrocephalus, or extra-axial fluid collection. No focal mass effect or edema. Symmetric basal ganglia calcifications. Cisterns are patent. No cerebellar abnormality. Vascular: No hyperdense vessel or unexpected calcification. Skull: Normal. Negative for fracture or focal lesion. Other: None. CT MAXILLOFACIAL FINDINGS Osseous: Acute minimally angulated midline nasal bone fractures, slightly worse on the left. Overlying nasal soft tissue injury. Orbits intact. Skull base, zygomas, maxilla, pterygoid plates, and mandible appear intact. Orbits: Negative. No traumatic or inflammatory finding. Sinuses: Clear. Soft tissues: Anterior nasal bridge soft tissue injury suspected. No other facial soft tissue asymmetry or focal hematoma. IMPRESSION: No acute intracranial abnormality.  Stable head CT without contrast Acute minimally angulated anterior nasal bone fractures and nasal bridge soft tissue injury. Electronically Signed   By: Judie PetitM.  Shick M.D.   On: 03/10/2021 16:41   CT Maxillofacial Wo Contrast  Result Date: 03/10/2021 CLINICAL DATA:  Facial injury, trauma, altered mental  status EXAM: CT HEAD WITHOUT CONTRAST CT MAXILLOFACIAL WITHOUT CONTRAST TECHNIQUE: Multidetector CT imaging of the head and maxillofacial structures were performed using the standard protocol without intravenous contrast. Multiplanar CT image reconstructions of the maxillofacial structures were also generated. COMPARISON:  02/10/2020 FINDINGS: CT HEAD FINDINGS Brain: Limited with some motion artifact. Some of the images were repeated. No acute intracranial hemorrhage, mass lesion, acute infarction, midline shift, herniation, hydrocephalus, or extra-axial fluid collection. No focal mass effect or edema. Symmetric basal ganglia calcifications. Cisterns are patent. No cerebellar abnormality. Vascular: No hyperdense vessel or unexpected calcification. Skull: Normal. Negative for fracture or focal lesion. Other: None. CT MAXILLOFACIAL FINDINGS Osseous: Acute minimally angulated midline nasal bone fractures, slightly worse on the left. Overlying nasal soft tissue injury. Orbits intact. Skull base, zygomas, maxilla, pterygoid plates, and mandible appear intact. Orbits: Negative. No traumatic or inflammatory finding. Sinuses: Clear. Soft tissues: Anterior nasal bridge soft tissue injury suspected. No other facial soft tissue asymmetry or focal hematoma. IMPRESSION: No acute intracranial abnormality.  Stable head CT without contrast Acute minimally angulated anterior nasal bone fractures and nasal bridge soft tissue injury. Electronically Signed   By: Judie PetitM.  Shick M.D.   On: 03/10/2021 16:41    Procedures Procedures   Medications Ordered in ED Medications  acetaminophen (TYLENOL) tablet 1,000 mg (1,000 mg Oral Given 03/10/21 1650)    ED Course  I have reviewed the triage vital signs and the nursing notes.  Pertinent labs & imaging results that were available during my care of the patient were reviewed by me and considered in my medical decision making (see chart for details).    MDM Rules/Calculators/A&P                           55 year old male with facial trauma just prior to arrival at work.  Suffered abrasions to the bridge of the nose and a left minimally displaced anterior nasal bone fracture.  Patient pain is controlled.  No signs of septal hematoma.  No headache, nausea or vomiting or signs of concussion.  Patient educated on signs and symptoms return to the ER for.  He will continue icing the bridge of the nose and apply an antibiotic ointment daily.  He will call ENT to schedule follow-up appointment 3 to 5 days.  He understands signs symptoms return to  ER for. Final Clinical Impression(s) / ED Diagnoses Final diagnoses:  Facial contusion, initial encounter  Nose abrasion, initial encounter  Closed fracture of nasal bone, initial encounter    Rx / DC Orders ED Discharge Orders          Ordered    traMADol (ULTRAM) 50 MG tablet  Every 6 hours PRN        03/10/21 1714             Evon Slack, PA-C 03/10/21 1726    Dionne Bucy, MD 03/10/21 2013

## 2021-03-11 ENCOUNTER — Encounter: Payer: Self-pay | Admitting: Physician Assistant

## 2021-03-11 ENCOUNTER — Ambulatory Visit: Payer: 59 | Admitting: Physician Assistant

## 2021-03-11 ENCOUNTER — Encounter: Payer: Self-pay | Admitting: Family Medicine

## 2021-03-11 VITALS — BP 165/112 | HR 66 | Temp 97.6°F | Resp 12 | Ht 70.0 in | Wt 230.0 lb

## 2021-03-11 DIAGNOSIS — S022XXA Fracture of nasal bones, initial encounter for closed fracture: Secondary | ICD-10-CM

## 2021-03-11 MED ORDER — ATORVASTATIN CALCIUM 40 MG PO TABS: 40.0000 mg | ORAL_TABLET | Freq: Every day | ORAL | 1 refills | Status: DC

## 2021-03-11 NOTE — Progress Notes (Signed)
____________________________________________   None    (approximate)  I have reviewed the triage vital signs and the nursing notes.   HISTORY  Chief Complaint No chief complaint on file.    HPI Denver Bentson is a 55 y.o. male patient presents for follow-up for emergency room visit secondary to facial contusion resulting in a minimally displaced nasal fracture.  This is a work-related injury which occurred yesterday.  Patient denies LOC or other head injuries.  Patient CT scan is consistent with complaint.  Patient has been consulted ENT clinic was to call for an appointment prior to his arrival to the clinic.  Patient denies headache or vision disturbance at this time.  Patient states he has not taken pain medication prescribed by the ED.  Patient requests return to work status         Past Medical History:  Diagnosis Date   Hyperlipidemia    Hypertension    Stroke [facility]) 2017    Patient Active Problem List   Diagnosis Date Noted   Heterozygous factor V Leiden mutation (HCC) 03/04/2020   Aphasia    Hyperlipidemia 10/25/2019   History of hemorrhagic stroke with residual hemiparesis (HCC) 03/06/2019   Thrombus of pulmonary vein (HCC) 11/21/2017   Migraines 11/03/2017   Headache 10/21/2017   COPD (chronic obstructive pulmonary disease) (HCC) 08/09/2017   Bronchitis 08/09/2017   Hypertension 11/10/2015   History of cerebral hemorrhage 11/10/2015   Nontraumatic subcortical hemorrhage of right cerebral hemisphere (HCC) 11/10/2015   Tobacco abuse 11/10/2015    Past Surgical History:  Procedure Laterality Date   NO PAST SURGERIES      Prior to Admission medications   Medication Sig Start Date End Date Taking? Authorizing Provider  albuterol (VENTOLIN HFA) 108 (90 Base) MCG/ACT inhaler Inhale 2 puffs into the lungs every 6 (six) hours as needed for wheezing or shortness of breath. 11/16/19  Yes Trey Sailors, PA-C  amLODipine (NORVASC) 5 MG tablet Take 1 tablet (5 mg  total) by mouth every evening. PLEASE NOTE CHANGE TO 5MG  TABLETS 03/05/21  Yes 05/05/21, MD  aspirin EC 81 MG EC tablet Take 1 tablet (81 mg total) by mouth daily. Swallow whole. 02/12/20  Yes 02/14/20, MD  atorvastatin (LIPITOR) 40 MG tablet Take 1 tablet (40 mg total) by mouth daily. 02/11/20 03/11/21 Yes 03/13/21, MD  B Complex Vitamins (VITAMIN B COMPLEX PO) Take 1 tablet by mouth daily as needed.   Yes [provider]  carvedilol (COREG) 12.5 MG tablet Take 1 tablet (12.5 mg total) by mouth 2 (two) times daily with a meal. 03/04/21  Yes Agbor-Etang, 05/04/21, MD  Cholecalciferol (D3-1000) 25 MCG (1000 UT) tablet  07/26/20  Yes [provider]  clopidogrel (PLAVIX) 75 MG tablet Take 1 tablet (75 mg total) by mouth daily. 12/11/20  Yes Agbor-Etang, 12/13/20, MD  fluticasone (FLONASE) 50 MCG/ACT nasal spray Place 1 spray into both nostrils 2 (two) times daily. 01/24/17  Yes [provider]  Fluticasone-Umeclidin-Vilant (TRELEGY ELLIPTA) 100-62.5-25 MCG/INH AEPB INHALE 1 PUFF BY MOUTH EVERY DAY 12/23/20  Yes 12/25/20, MD  ipratropium-albuterol (DUONEB) 0.5-2.5 (3) MG/3ML SOLN Inhale 3 mLs into the lungs 4 (four) times daily as needed. 02/04/21  Yes 02/06/21, MD  meloxicam (MOBIC) 15 MG tablet Take 1 tablet (15 mg total) by mouth daily. 01/21/21  Yes Hyatt, Max T, DPM  spironolactone (ALDACTONE) 25 MG tablet Take 25 mg by mouth daily. 06/24/20  Yes [provider]  losartan (COZAAR) 50 MG tablet Take 1 tablet (50 mg total) by mouth in the morning and at bedtime. 11/12/20 02/10/21  Debbe Odea, MD  Spacer/Aero-Holding Chambers (OPTICHAMBER DIAMOND-SM MASK) MISC Use with inhalers. 08/11/17   [provider]    Allergies Shellfish allergy  Family History  Problem Relation Age of Onset   Healthy Mother    Lung cancer Father    Prostate cancer Father    Healthy Sister    Hypertension Brother    Healthy Sister     Social  History Social History   Tobacco Use   Smoking status: Every Day    Packs/day: 1.00    Types: Cigarettes   Smokeless tobacco: Never  Vaping Use   Vaping Use: Never used  Substance Use Topics   Alcohol use: No   Drug use: No    Review of Systems  Constitutional: No fever/chills Eyes: No visual changes. ENT: No sore throat. Cardiovascular: Denies chest pain. Respiratory: Denies shortness of breath. Gastrointestinal: No abdominal pain.  No nausea, no vomiting.  No diarrhea.  No constipation. Genitourinary: Negative for dysuria. Musculoskeletal: Negative for back pain. Skin: Negative for rash. Neurological: Residual hemiparesis Endocrine: Hyperlipidemia and hypertension. Allergic/Immunilogical: Shellfish ____________________________________________   PHYSICAL EXAM:  VITAL SIGNS: Temperature 97.6, pulse 66, respiration 12, BP is 165/112.  Patient is 94% O2 sat on room air.  Patient weighs 230 pounds and BMI is 33.0. Constitutional: Alert and oriented. Well appearing and in no acute distress. Eyes: Conjunctivae are normal. PERRL. EOMI. Head: Atraumatic. Nose: Obvious edema but no deformity.   Mouth/Throat: Mucous membranes are moist.  Oropharynx non-erythematous. Neck: No stridor.  No cervical spine tenderness to palpation. Hematological/Lymphatic/Immunilogical: No cervical lymphadenopathy. Cardiovascular: Normal rate, regular rhythm. Grossly normal heart sounds.  Good peripheral circulation.  Elevated blood pressure.  Patient states he did take his medication prior to arrival. Respiratory: Normal respiratory effort.  No retractions. Lungs CTAB. Gastrointestinal: Soft and nontender. No distention. No abdominal bruits. No CVA tenderness. Genitourinary: Deferred Musculoskeletal: No lower extremity tenderness nor edema.  No joint effusions. Neurologic:  Normal speech and language. No gross focal neurologic deficits are appreciated. No gait instability. Skin: Abrasion and edema  to the nasal area.  No rash noted. Psychiatric: Mood and affect are normal. Speech and behavior are normal.  ____________________________________________   LABS (all labs ordered are listed, but only abnormal results are displayed)   ____________________________________________  EKG   ____________________________________________  RADIOLOGY I, Joni Reining, personally viewed and evaluated these images (plain radiographs) as part of my medical decision making, as well as reviewing the written report by the radiologist.  ED MD interpretation:    Official radiology report(s): CT HEAD WO CONTRAST ( )  Result Date: 03/10/2021 CLINICAL DATA:  Facial injury, trauma, altered mental status EXAM: CT HEAD WITHOUT CONTRAST CT MAXILLOFACIAL WITHOUT CONTRAST TECHNIQUE: Multidetector CT imaging of the head and maxillofacial structures were performed using the standard protocol without intravenous contrast. Multiplanar CT image reconstructions of the maxillofacial structures were also generated. COMPARISON:  02/10/2020 FINDINGS: CT HEAD FINDINGS Brain: Limited with some motion artifact. Some of the images were repeated. No acute intracranial hemorrhage, mass lesion, acute infarction, midline shift, herniation, hydrocephalus, or extra-axial fluid collection. No focal mass effect or edema. Symmetric basal ganglia calcifications. Cisterns are patent. No cerebellar abnormality. Vascular: No hyperdense vessel or unexpected calcification. Skull: Normal. Negative for fracture or focal lesion. Other: None. CT MAXILLOFACIAL FINDINGS Osseous: Acute minimally angulated midline nasal bone fractures, slightly worse on the left.  Overlying nasal soft tissue injury. Orbits intact. Skull base, zygomas, maxilla, pterygoid plates, and mandible appear intact. Orbits: Negative. No traumatic or inflammatory finding. Sinuses: Clear. Soft tissues: Anterior nasal bridge soft tissue injury suspected. No other facial soft tissue  asymmetry or focal hematoma. IMPRESSION: No acute intracranial abnormality.  Stable head CT without contrast Acute minimally angulated anterior nasal bone fractures and nasal bridge soft tissue injury. Electronically Signed   By: Judie Petit.  Shick M.D.   On: 03/10/2021 16:41   CT Maxillofacial Wo Contrast  Result Date: 03/10/2021 CLINICAL DATA:  Facial injury, trauma, altered mental status EXAM: CT HEAD WITHOUT CONTRAST CT MAXILLOFACIAL WITHOUT CONTRAST TECHNIQUE: Multidetector CT imaging of the head and maxillofacial structures were performed using the standard protocol without intravenous contrast. Multiplanar CT image reconstructions of the maxillofacial structures were also generated. COMPARISON:  02/10/2020 FINDINGS: CT HEAD FINDINGS Brain: Limited with some motion artifact. Some of the images were repeated. No acute intracranial hemorrhage, mass lesion, acute infarction, midline shift, herniation, hydrocephalus, or extra-axial fluid collection. No focal mass effect or edema. Symmetric basal ganglia calcifications. Cisterns are patent. No cerebellar abnormality. Vascular: No hyperdense vessel or unexpected calcification. Skull: Normal. Negative for fracture or focal lesion. Other: None. CT MAXILLOFACIAL FINDINGS Osseous: Acute minimally angulated midline nasal bone fractures, slightly worse on the left. Overlying nasal soft tissue injury. Orbits intact. Skull base, zygomas, maxilla, pterygoid plates, and mandible appear intact. Orbits: Negative. No traumatic or inflammatory finding. Sinuses: Clear. Soft tissues: Anterior nasal bridge soft tissue injury suspected. No other facial soft tissue asymmetry or focal hematoma. IMPRESSION: No acute intracranial abnormality.  Stable head CT without contrast Acute minimally angulated anterior nasal bone fractures and nasal bridge soft tissue injury. Electronically Signed   By: Judie Petit.  Shick M.D.   On: 03/10/2021 16:41     ____________________________________________   PROCEDURES  Procedure(s) performed (including Critical Care):  Procedures   ____________________________________________   INITIAL IMPRESSION / ASSESSMENT AND PLAN / ED COURSE  As part of my medical decision making, I reviewed the following data within the electronic MEDICAL RECORD NUMBER         Minimal displaced nasal fracture secondary to contusion.  Patient advised conservative treatment consisting of ice packs and advised to follow-up with ENT clinic by calling for an appointment.    ____________________________________________   FINAL CLINICAL IMPRESSION(S) / ED DIAGNOSES  Nasal fracture   ED Discharge Orders     None        Note:  This document was prepared using Dragon voice recognition software and may include unintentional dictation errors.

## 2021-03-11 NOTE — Progress Notes (Signed)
Pt was hit in the face with a tree yesterday at work. Stating his eyes are watering a lot and some face pain along the bridge of his nose as well. Cl,RMA

## 2021-03-16 ENCOUNTER — Encounter: Payer: Self-pay | Admitting: Family Medicine

## 2021-03-17 ENCOUNTER — Other Ambulatory Visit: Payer: Self-pay

## 2021-03-17 ENCOUNTER — Encounter: Payer: Self-pay | Admitting: Family Medicine

## 2021-03-17 DIAGNOSIS — J441 Chronic obstructive pulmonary disease with (acute) exacerbation: Secondary | ICD-10-CM

## 2021-03-17 DIAGNOSIS — Z20822 Contact with and (suspected) exposure to covid-19: Secondary | ICD-10-CM

## 2021-03-17 MED ORDER — D3-1000 25 MCG (1000 UT) PO TABS: 1000.0000 [IU] | ORAL_TABLET | Freq: Every day | ORAL | 3 refills | Status: AC

## 2021-03-18 MED ORDER — ALBUTEROL SULFATE HFA 108 (90 BASE) MCG/ACT IN AERS: 2.0000 | INHALATION_SPRAY | Freq: Four times a day (QID) | RESPIRATORY_TRACT | 3 refills | Status: DC | PRN

## 2021-03-20 ENCOUNTER — Telehealth: Payer: Self-pay | Admitting: Cardiology

## 2021-03-20 NOTE — Telephone Encounter (Signed)
-----   Message from Festus Aloe, CMA sent at 03/20/2021  1:12 PM EDT ----- Please contact patient for a follow up with Dr. Azucena Cecil. The patient is past due and is requesting refills.  Thanks, Jasmine December

## 2021-03-20 NOTE — Telephone Encounter (Signed)
Attempted to schedule.  LMOV to call office.  ° °

## 2021-03-26 ENCOUNTER — Ambulatory Visit: Payer: 59 | Admitting: Family Medicine

## 2021-04-01 ENCOUNTER — Other Ambulatory Visit: Payer: Self-pay | Admitting: Family Medicine

## 2021-04-01 DIAGNOSIS — J449 Chronic obstructive pulmonary disease, unspecified: Secondary | ICD-10-CM

## 2021-04-12 ENCOUNTER — Other Ambulatory Visit: Payer: Self-pay | Admitting: Family Medicine

## 2021-04-12 DIAGNOSIS — J449 Chronic obstructive pulmonary disease, unspecified: Secondary | ICD-10-CM

## 2021-04-17 NOTE — Telephone Encounter (Signed)
Attempted to schedule.  LMOV to call office.  ° °

## 2021-04-25 ENCOUNTER — Other Ambulatory Visit: Payer: Self-pay | Admitting: Family Medicine

## 2021-04-25 DIAGNOSIS — I10 Essential (primary) hypertension: Secondary | ICD-10-CM

## 2021-04-25 NOTE — Telephone Encounter (Signed)
called [facility] and pharmacist will take this med dose off file

## 2021-05-04 ENCOUNTER — Other Ambulatory Visit: Payer: 59

## 2021-05-04 ENCOUNTER — Other Ambulatory Visit: Payer: Self-pay

## 2021-05-04 DIAGNOSIS — Z0283 Encounter for blood-alcohol and blood-drug test: Secondary | ICD-10-CM

## 2021-05-04 NOTE — Progress Notes (Signed)
Presents to [facility] for Random DOT Drug Screen & Breath Alcohol Screen.  [facility] Acct #:  [id number] [facility] Specimen #:  [id number]  AMD

## 2021-05-06 ENCOUNTER — Encounter: Payer: Self-pay | Admitting: Physician Assistant

## 2021-05-06 ENCOUNTER — Other Ambulatory Visit: Payer: Self-pay

## 2021-05-06 ENCOUNTER — Ambulatory Visit: Payer: Self-pay | Admitting: Physician Assistant

## 2021-05-06 DIAGNOSIS — S0502XA Injury of conjunctiva and corneal abrasion without foreign body, left eye, initial encounter: Secondary | ICD-10-CM

## 2021-05-06 MED ORDER — TOBRAMYCIN 0.3 % OP SOLN: 1.0000 [drp] | Freq: Four times a day (QID) | OPHTHALMIC | 0 refills | Status: DC

## 2021-05-06 MED ORDER — NAPHAZOLINE-PHENIRAMINE 0.025-0.3 % OP SOLN: 1.0000 [drp] | Freq: Four times a day (QID) | OPHTHALMIC | 0 refills | Status: DC | PRN

## 2021-05-06 NOTE — Progress Notes (Signed)
   Subjective: Left eye pain    Patient ID: Jon Yang, male    DOB: Jun 13, 1966, 55 y.o.   MRN: [id number]  HPI Patient presents with foreign body sensation and eye pain secondary to being struck by a tree branch earlier this morning.  Patient denies vision loss.  Patient state he purchase over-the-counter Visine which only provides mild relief.   Review of Systems Leiden factor V, history of cerebral hemorrhage/stroke, hyperlipidemia, hypertension.    Objective:   Physical Exam No acute distress.  Patient.  Is 20/20 with correction.  Obtain patient consent and applied 2 drops of tetracaine to perform a fluorescein stain.  Corneal abrasion is noted to superior aspect of the left cornea  with no visible foreign body.       Assessment & Plan: Left corneal abrasion   Patient given discharge care instructions and a prescription for clindamycin and Naphcon ophthalmic eyedrops.  Patient will follow-up in 5 days.  Advised to go to the emergency room if condition worsen before reevaluation.

## 2021-05-08 ENCOUNTER — Encounter: Payer: 59 | Admitting: Family Medicine

## 2021-05-08 NOTE — Progress Notes (Deleted)
Complete physical exam   Patient: Jon Yang   DOB: [birth year]   55 y.o. Male  MRN: [id number] Visit Date: 05/08/2021  Today's healthcare provider: Lelon Huh, MD   No chief complaint on file.  Subjective    Jon Yang is a 55 y.o. male who presents today for a complete physical exam.  He reports consuming a {diet types:17450} diet. {Exercise:19826} He generally feels {well/fairly well/poorly:18703}. He reports sleeping {well/fairly well/poorly:18703}. He {does/does not:200015} have additional problems to discuss today.  HPI  Hypertension, follow-up  BP Readings from Last 3 Encounters:  03/11/21 (!) 165/112  03/10/21 (!) 179/118  02/09/21 (!) 155/94   Wt Readings from Last 3 Encounters:  03/11/21 230 lb (104.3 kg)  03/10/21 237 lb (107.5 kg)  02/09/21 233 lb (105.7 kg)     He was last seen for hypertension on 02/09/2021.   BP at that visit was 155/94. Management since that visit includes increasing amlodipine from 2.$RemoveBeforeDEI'5mg'efcwjYmbNWhmTpyc$  daily to $Remove'5mg'APWFHGb$  daily. Patient also advise to drink more water due to decline in kidney functions  He reports {excellent/good/fair/poor:19665} compliance with treatment. He {is/is not:9024} having side effects. {document side effects if present:1} He is following a {diet:21022986} diet. He {is/is not:9024} exercising. He {does/does not:200015} smoke.  Use of agents associated with hypertension: NSAIDS.   Outside blood pressures are {***enter patient reported home BP readings, or 'not being checked':1}. Symptoms: {Yes/No:20286} chest pain {Yes/No:20286} chest pressure  {Yes/No:20286} palpitations {Yes/No:20286} syncope  {Yes/No:20286} dyspnea {Yes/No:20286} orthopnea  {Yes/No:20286} paroxysmal nocturnal dyspnea {Yes/No:20286} lower extremity edema   Pertinent labs: Lab Results  Component Value Date   CHOL 251 (H) 02/09/2021   HDL 42 02/09/2021   LDLCALC 172 (H) 02/09/2021   TRIG 196 (H) 02/09/2021   CHOLHDL 6.0 (H) 02/09/2021   Lab Results   Component Value Date   NA 140 02/09/2021   K 4.9 02/09/2021   CREATININE 1.34 (H) 02/09/2021   EGFR 63 02/09/2021   GFRNONAA >60 07/19/2020   GLUCOSE 79 02/09/2021     The ASCVD Risk score (Arnett DK, et al., 2019) failed to calculate for the following reasons:   The patient has a prior MI or stroke diagnosis   ---------------------------------------------------------------------------------------------------   Lipid/Cholesterol, Follow-up  Last lipid panel Other pertinent labs  Lab Results  Component Value Date   CHOL 251 (H) 02/09/2021   HDL 42 02/09/2021   LDLCALC 172 (H) 02/09/2021   TRIG 196 (H) 02/09/2021   CHOLHDL 6.0 (H) 02/09/2021   Lab Results  Component Value Date   ALT 37 02/09/2021   AST 28 02/09/2021   PLT 216 02/09/2021     He was last seen for this on 02/09/2021.   Management since that visit includes starting back on atorvastatin.  He reports {excellent/good/fair/poor:19665} compliance with treatment. He {is/is not:9024} having side effects. {document side effects if present:1}  Symptoms: {Yes/No:20286} chest pain {Yes/No:20286} chest pressure/discomfort  {Yes/No:20286} dyspnea {Yes/No:20286} lower extremity edema  {Yes/No:20286} numbness or tingling of extremity {Yes/No:20286} orthopnea  {Yes/No:20286} palpitations {Yes/No:20286} paroxysmal nocturnal dyspnea  {Yes/No:20286} speech difficulty {Yes/No:20286} syncope   Current diet: {diet habits:16563} Current exercise: {exercise types:16438}  The ASCVD Risk score (Arnett DK, et al., 2019) failed to calculate for the following reasons:   The patient has a prior MI or stroke diagnosis  ---------------------------------------------------------------------------------------------------   Past Medical History:  Diagnosis Date   Hyperlipidemia    Hypertension    Stroke [facility]) 2017   Past Surgical History:  Procedure Laterality Date   NO  PAST SURGERIES     Social History   Socioeconomic  History   Marital status: Married    Spouse name: Not on file   Number of children: Not on file   Years of education: Not on file   Highest education level: Not on file  Occupational History   Not on file  Tobacco Use   Smoking status: Every Day    Packs/day: 1.00    Types: Cigarettes   Smokeless tobacco: Never  Vaping Use   Vaping Use: Never used  Substance and Sexual Activity   Alcohol use: No   Drug use: No   Sexual activity: Not on file  Other Topics Concern   Not on file  Social History Narrative   Works for [employer]; smoking- 1ppd [3-4 ppd]; no alcohol; lives in [place].    Social Determinants of Health   Financial Resource Strain: Not on file  Food Insecurity: Not on file  Transportation Needs: Not on file  Physical Activity: Not on file  Stress: Not on file  Social Connections: Not on file  Intimate Partner Violence: Not on file   Family Status  Relation Name Status   Mother  Alive   Father  Deceased at age 27   Sister  Alive   Brother  Alive   Sister  Alive   Family History  Problem Relation Age of Onset   Healthy Mother    Lung cancer Father    Prostate cancer Father    Healthy Sister    Hypertension Brother    Healthy Sister    Allergies  Allergen Reactions   Shellfish Allergy     Other reaction(s): Other (See Comments) Feels drunk Lightheaded    Patient Care Team: Maryella Shivers as PCP - General (Physician Assistant) Debbe Odea, MD as PCP - Cardiology (Cardiology)   Medications: Outpatient Medications Prior to Visit  Medication Sig   albuterol (VENTOLIN HFA) 108 (90 Base) MCG/ACT inhaler Inhale 2 puffs into the lungs every 6 (six) hours as needed for wheezing or shortness of breath.   amLODipine (NORVASC) 5 MG tablet Take 1 tablet (5 mg total) by mouth every evening. PLEASE NOTE CHANGE TO 5MG  TABLETS   aspirin EC 81 MG EC tablet Take 1 tablet (81 mg total) by mouth daily. Swallow whole.   atorvastatin  (LIPITOR) 40 MG tablet Take 1 tablet (40 mg total) by mouth daily.   B Complex Vitamins (VITAMIN B COMPLEX PO) Take 1 tablet by mouth daily as needed.   carvedilol (COREG) 12.5 MG tablet Take 1 tablet (12.5 mg total) by mouth 2 (two) times daily with a meal.   Cholecalciferol (D3-1000) 25 MCG (1000 UT) tablet Take 1 tablet (1,000 Units total) by mouth daily.   clopidogrel (PLAVIX) 75 MG tablet Take 1 tablet (75 mg total) by mouth daily.   fluticasone (FLONASE) 50 MCG/ACT nasal spray Place 1 spray into both nostrils 2 (two) times daily.   ipratropium-albuterol (DUONEB) 0.5-2.5 (3) MG/3ML SOLN INHALE 3 MLS INTO THE LUNGS 4 (FOUR) TIMES DAILY AS NEEDED.   losartan (COZAAR) 50 MG tablet Take 1 tablet (50 mg total) by mouth in the morning and at bedtime.   meloxicam (MOBIC) 15 MG tablet Take 1 tablet (15 mg total) by mouth daily.   naphazoline-pheniramine (NAPHCON-A) 0.025-0.3 % ophthalmic solution Place 1 drop into the left eye 4 (four) times daily as needed for eye irritation.   Spacer/Aero-Holding Lynsi Dooner (OPTICHAMBER DIAMOND-SM MASK) MISC Use with inhalers.  tobramycin (TOBREX) 0.3 % ophthalmic solution Place 1 drop into the left eye every 6 (six) hours.   TRELEGY ELLIPTA 100-62.5-25 MCG/INH AEPB INHALE 1 PUFF BY MOUTH EVERY DAY   No facility-administered medications prior to visit.    Review of Systems  {Labs  Heme  Chem  Endocrine  Serology  Results Review (optional):23779}  Objective    There were no vitals taken for this visit. {Show previous vital signs (optional):23777}  Physical Exam  ***  Last depression screening scores PHQ 2/9 Scores 02/09/2021 10/27/2020 06/13/2020  PHQ - 2 Score 0 0 0  PHQ- 9 Score - 1 0   Last fall risk screening Fall Risk  10/27/2020  Falls in the past year? 1  Number falls in past yr: 0  Injury with Fall? 1  Risk for fall due to : -  Follow up -   Last Audit-C alcohol use screening Alcohol Use Disorder Test (AUDIT) 10/27/2020  1. How often do  you have a drink containing alcohol? 0  2. How many drinks containing alcohol do you have on a typical day when you are drinking? 0  3. How often do you have six or more drinks on one occasion? 0  AUDIT-C Score 0   A score of 3 or more in women, and 4 or more in men indicates increased risk for alcohol abuse, EXCEPT if all of the points are from question 1   No results found for any visits on 05/08/21.  Assessment & Plan    Routine Health Maintenance and Physical Exam  Exercise Activities and Dietary recommendations  Goals   None     Immunization History  Administered Date(s) Administered   Influenza,inj,Quad PF,6+ Mos 08/11/2017   Td 04/07/2016   Tdap 03/26/2016    Health Maintenance  Topic Date Due   COVID-19 Vaccine (1) Never done   Zoster Vaccines- Shingrix (1 of 2) Never done   INFLUENZA VACCINE  02/23/2021   Fecal DNA (Cologuard)  05/31/2023   TETANUS/TDAP  04/07/2026   Hepatitis C Screening  Completed   HIV Screening  Completed   HPV VACCINES  Aged Out    Discussed health benefits of physical activity, and encouraged him to engage in regular exercise appropriate for his age and condition.  ***  No follow-ups on file.     {provider attestation***:1}   Lelon Huh, MD  [facility] [phone] (phone) [phone] (fax)  [place]

## 2021-05-11 ENCOUNTER — Other Ambulatory Visit: Payer: 59 | Admitting: Physician Assistant

## 2021-05-27 ENCOUNTER — Encounter: Payer: Self-pay | Admitting: Physician Assistant

## 2021-05-27 ENCOUNTER — Other Ambulatory Visit: Payer: Self-pay

## 2021-05-27 ENCOUNTER — Ambulatory Visit: Payer: Self-pay | Admitting: Physician Assistant

## 2021-05-27 VITALS — BP 170/109 | HR 95 | Temp 98.2°F | Resp 16 | Ht 70.0 in | Wt 230.0 lb

## 2021-05-27 DIAGNOSIS — S0502XD Injury of conjunctiva and corneal abrasion without foreign body, left eye, subsequent encounter: Secondary | ICD-10-CM

## 2021-05-27 NOTE — Progress Notes (Signed)
Pt states his left eye is doing great./CL,RMA

## 2021-05-27 NOTE — Progress Notes (Signed)
   Subjective: Left corneal abrasion    Patient ID: Jon Yang, male    DOB: January 24, 1966, 55 y.o.   MRN: [id number]  HPI Patient follow-up for eye pain secondary to left corneal abrasion 2 weeks ago.  Patient voices no concerns or complaints this time.   Review of Systems Hypertension and hyperlipidemia.    Objective:   Physical Exam No acute distress.  Temperature 98.2, pulse 95, respiration 16, BP is elevated 170/109.  Patient is 94% O2 sat on room air.  Patient weighs 230 pounds and BMI is 33. Eyes are PERRl and EOM's intact.  Staining of eye reveals no corneal lesion.       Assessment & Plan:   Advised patient coronary abrasion left eye is healed.

## 2021-05-29 ENCOUNTER — Ambulatory Visit: Payer: 59

## 2021-06-06 ENCOUNTER — Encounter: Payer: Self-pay | Admitting: Family Medicine

## 2021-06-06 ENCOUNTER — Other Ambulatory Visit: Payer: Self-pay

## 2021-06-06 DIAGNOSIS — I639 Cerebral infarction, unspecified: Secondary | ICD-10-CM

## 2021-06-08 ENCOUNTER — Encounter: Payer: Self-pay | Admitting: Family Medicine

## 2021-06-08 MED ORDER — CLOPIDOGREL BISULFATE 75 MG PO TABS: 75.0000 mg | ORAL_TABLET | Freq: Every day | ORAL | 3 refills | Status: DC

## 2021-06-10 ENCOUNTER — Ambulatory Visit: Payer: 59

## 2021-06-12 ENCOUNTER — Other Ambulatory Visit: Payer: Self-pay

## 2021-06-12 ENCOUNTER — Encounter: Payer: Self-pay | Admitting: Family Medicine

## 2021-06-12 ENCOUNTER — Ambulatory Visit (INDEPENDENT_AMBULATORY_CARE_PROVIDER_SITE_OTHER): Payer: 59 | Admitting: Family Medicine

## 2021-06-12 VITALS — BP 190/115 | HR 86 | Temp 97.9°F | Resp 18 | Ht 70.0 in | Wt 228.6 lb

## 2021-06-12 DIAGNOSIS — Z720 Tobacco use: Secondary | ICD-10-CM

## 2021-06-12 DIAGNOSIS — Z0000 Encounter for general adult medical examination without abnormal findings: Secondary | ICD-10-CM | POA: Diagnosis not present

## 2021-06-12 DIAGNOSIS — J449 Chronic obstructive pulmonary disease, unspecified: Secondary | ICD-10-CM

## 2021-06-12 DIAGNOSIS — I10 Essential (primary) hypertension: Secondary | ICD-10-CM | POA: Diagnosis not present

## 2021-06-12 DIAGNOSIS — D6851 Activated protein C resistance: Secondary | ICD-10-CM | POA: Diagnosis not present

## 2021-06-12 DIAGNOSIS — E785 Hyperlipidemia, unspecified: Secondary | ICD-10-CM | POA: Diagnosis not present

## 2021-06-12 DIAGNOSIS — I69359 Hemiplegia and hemiparesis following cerebral infarction affecting unspecified side: Secondary | ICD-10-CM

## 2021-06-12 MED ORDER — CARVEDILOL 12.5 MG PO TABS: 12.5000 mg | ORAL_TABLET | Freq: Two times a day (BID) | ORAL | 3 refills | Status: AC

## 2021-06-12 MED ORDER — LOSARTAN POTASSIUM 50 MG PO TABS: 50.0000 mg | ORAL_TABLET | Freq: Two times a day (BID) | ORAL | 3 refills | Status: DC

## 2021-06-12 NOTE — Progress Notes (Signed)
Complete physical exam   Patient: Jon Yang   DOB: 1966/07/23   55 y.o. Male  MRN: [id number] Visit Date: 06/12/2021  Today's healthcare provider: Lelon Huh, MD   Chief Complaint  Patient presents with   Annual Exam   Subjective    Jon Yang is a 55 y.o. male who presents today for a complete physical exam.  He reports consuming a general diet. The patient does not participate in regular exercise at present. He generally feels fairly well. He reports sleeping fairly well. He does not have additional problems to discuss today.  HPI  Hypertension, follow-up  BP Readings from Last 3 Encounters:  06/12/21 (!) 190/115  05/27/21 (!) 170/109  03/11/21 (!) 165/112   Wt Readings from Last 3 Encounters:  06/12/21 228 lb 9.6 oz (103.7 kg)  05/27/21 230 lb (104.3 kg)  03/11/21 230 lb (104.3 kg)     He was last seen for hypertension 4 months ago.  BP at that visit was 155/94. Management since that visit includes increasing Amlodipine from 2.76m daily to 521mdaily.  He reports good compliance with treatment. He is not having side effects.  He is following a Regular diet. He is not exercising. He does smoke.  Use of agents associated with hypertension: none.   Outside blood pressures are not checked. Symptoms: No chest pain No chest pressure  No palpitations No syncope  No dyspnea No orthopnea  No paroxysmal nocturnal dyspnea No lower extremity edema   Pertinent labs: Lab Results  Component Value Date   CHOL 251 (H) 02/09/2021   HDL 42 02/09/2021   LDLCALC 172 (H) 02/09/2021   TRIG 196 (H) 02/09/2021   CHOLHDL 6.0 (H) 02/09/2021   Lab Results  Component Value Date   NA 140 02/09/2021   K 4.9 02/09/2021   CREATININE 1.34 (H) 02/09/2021   EGFR 63 02/09/2021   GLUCOSE 79 02/09/2021     The ASCVD Risk score (Arnett DK, et al., 2019) failed to calculate for the following reasons:   The patient has a prior MI or stroke diagnosis    ---------------------------------------------------------------------------------------------------   Lipid/Cholesterol, Follow-up  Last lipid panel Other pertinent labs  Lab Results  Component Value Date   CHOL 251 (H) 02/09/2021   HDL 42 02/09/2021   LDLCALC 172 (H) 02/09/2021   TRIG 196 (H) 02/09/2021   CHOLHDL 6.0 (H) 02/09/2021   Lab Results  Component Value Date   ALT 37 02/09/2021   AST 28 02/09/2021   PLT 216 02/09/2021     He was last seen for this 4 months ago.  Management since that visit includes recommend getting back on statin.  He reports poor compliance with treatment. Patient is unsure whether he is taking a statin. He says his wife does his medications for him. He is not having side effects.   Symptoms: No chest pain No chest pressure/discomfort  No dyspnea No lower extremity edema  No numbness or tingling of extremity No orthopnea  No palpitations No paroxysmal nocturnal dyspnea  No speech difficulty No syncope   Current diet: in general, an "unhealthy" diet Current exercise: none  The ASCVD Risk score (Arnett DK, et al., 2019) failed to calculate for the following reasons:   The patient has a prior MI or stroke diagnosis  ---------------------------------------------------------------------------------------------------   Past Medical History:  Diagnosis Date   Hyperlipidemia    [facility]pertension    [facility]   Past Surgical History:  Procedure Laterality Date  NO PAST SURGERIES     Social History   Socioeconomic History   Marital status: Married    Spouse name: Not on file   Number of children: Not on file   Years of education: Not on file   Highest education level: Not on file  Occupational History   Not on file  Tobacco Use   Smoking status: Every Day    Packs/day: 1.00    Types: Cigarettes   Smokeless tobacco: Never  Vaping Use   Vaping Use: Never used  Substance and Sexual Activity   Alcohol use: No   Drug use: No    Sexual activity: Not on file  Other Topics Concern   Not on file  Social History Narrative   Works for [employer]; smoking- 1ppd [3-4 ppd]; no alcohol; lives in [place].    Social Determinants of Health   Financial Resource Strain: Not on file  Food Insecurity: Not on file  Transportation Needs: Not on file  Physical Activity: Not on file  Stress: Not on file  Social Connections: Not on file  Intimate Partner Violence: Not on file   Family Status  Relation Name Status   Mother  Alive   Father  Deceased at age 55   Sister  71   Brother  Alive   Sister  Alive   Family History  Problem Relation Age of Onset   Healthy Mother    Lung cancer Father    Prostate cancer Father    Healthy Sister    Hypertension Brother    Healthy Sister    Allergies  Allergen Reactions   Shellfish Allergy     Other reaction(s): Other (See Comments) Feels drunk Lightheaded    Patient Care Team: Mikey Kirschner, PA-C as PCP - General (Physician Assistant) Kate Sable, MD as PCP - Cardiology (Cardiology)   Medications: Outpatient Medications Prior to Visit  Medication Sig   albuterol (VENTOLIN HFA) 108 (90 Base) MCG/ACT inhaler Inhale 2 puffs into the lungs every 6 (six) hours as needed for wheezing or shortness of breath.   amLODipine (NORVASC) 5 MG tablet Take 1 tablet (5 mg total) by mouth every evening. PLEASE NOTE CHANGE TO 5MG TABLETS   aspirin EC 81 MG EC tablet Take 1 tablet (81 mg total) by mouth daily. Swallow whole.   B Complex Vitamins (VITAMIN B COMPLEX PO) Take 1 tablet by mouth daily as needed.   carvedilol (COREG) 12.5 MG tablet Take 1 tablet (12.5 mg total) by mouth 2 (two) times daily with a meal.   Cholecalciferol (D3-1000) 25 MCG (1000 UT) tablet Take 1 tablet (1,000 Units total) by mouth daily.   clopidogrel (PLAVIX) 75 MG tablet Take 1 tablet (75 mg total) by mouth daily.   fluticasone (FLONASE) 50 MCG/ACT nasal spray Place 1 spray into both  nostrils 2 (two) times daily.   ipratropium-albuterol (DUONEB) 0.5-2.5 (3) MG/3ML SOLN INHALE 3 MLS INTO THE LUNGS 4 (FOUR) TIMES DAILY AS NEEDED.   meloxicam (MOBIC) 15 MG tablet Take 1 tablet (15 mg total) by mouth daily.   naphazoline-pheniramine (NAPHCON-A) 0.025-0.3 % ophthalmic solution Place 1 drop into the left eye 4 (four) times daily as needed for eye irritation.   Spacer/Aero-Holding Chambers (OPTICHAMBER DIAMOND-SM MASK) MISC Use with inhalers.   tobramycin (TOBREX) 0.3 % ophthalmic solution Place 1 drop into the left eye every 6 (six) hours.   TRELEGY ELLIPTA 100-62.5-25 MCG/INH AEPB INHALE 1 PUFF BY MOUTH EVERY DAY   atorvastatin (LIPITOR) 40 MG  tablet Take 1 tablet (40 mg total) by mouth daily.   losartan (COZAAR) 50 MG tablet Take 1 tablet (50 mg total) by mouth in the morning and at bedtime.   No facility-administered medications prior to visit.    Review of Systems  Constitutional:  Negative for appetite change, chills, fatigue and fever.  HENT:  Negative for congestion, ear pain, hearing loss, nosebleeds and trouble swallowing.   Eyes:  Negative for pain and visual disturbance.  Respiratory:  Negative for cough, chest tightness, shortness of breath and wheezing.   Cardiovascular:  Negative for chest pain, palpitations and leg swelling.  Gastrointestinal:  Negative for abdominal pain, blood in stool, constipation, diarrhea, nausea and vomiting.  Endocrine: Negative for polydipsia, polyphagia and polyuria.  Genitourinary:  Negative for dysuria and flank pain.  Musculoskeletal:  Negative for arthralgias, back pain, joint swelling, myalgias and neck stiffness.  Skin:  Negative for color change, rash and wound.  Neurological:  Negative for dizziness, tremors, seizures, speech difficulty, weakness, light-headedness and headaches.  Psychiatric/Behavioral:  Negative for behavioral problems, confusion, decreased concentration, dysphoric mood and sleep disturbance. The patient is  not nervous/anxious.   All other systems reviewed and are negative.    Objective    BP (!) 190/115 (BP Location: Right Arm, Patient Position: Sitting, Cuff Size: Large)   Pulse 86   Temp 97.9 F (36.6 C) (Oral)   Resp 18   Ht _0  (1.778 m)   Wt 228 lb 9.6 oz (103.7 kg)   SpO2 95% Comment: room air  BMI 32.80 kg/m  Today's Vitals   06/12/21 1509 06/12/21 1515  BP: (!) 173/108 (!) 190/115  Pulse: 86   Resp: 18   Temp: 97.9 F (36.6 C)   TempSrc: Oral   SpO2: 95%   Weight: 228 lb 9.6 oz (103.7 kg)   Height: _1  (1.778 m)    Body mass index is 32.8 kg/m.     Physical Exam   General Appearance:    Mildly obese male. Alert, cooperative, in no acute distress, appears stated age  Head:    Normocephalic, without obvious abnormality, atraumatic  Eyes:    PERRL, conjunctiva/corneas clear, EOM's intact, fundi    benign, both eyes       Ears:    Normal TM's and external ear canals, both ears  Neck:   Supple, symmetrical, trachea midline, no adenopathy;       thyroid:  No enlargement/tenderness/nodules; no carotid   bruit or JVD  Back:     Symmetric, no curvature, ROM normal, no CVA tenderness  Lungs:     Clear to auscultation bilaterally, respirations unlabored  Chest wall:    No tenderness or deformity  Heart:    Normal heart rate. Normal rhythm. No murmurs, rubs, or gallops.  S1 and S2 normal  Abdomen:     Soft, non-tender, bowel sounds active all four quadrants,    no masses, no organomegaly  Genitalia:    deferred  Rectal:    deferred  Extremities:   All extremities are intact. No cyanosis or edema  Pulses:   2+ and symmetric all extremities  Skin:   Skin color, texture, turgor normal, no rashes or lesions  Lymph nodes:   Cervical, supraclavicular, and axillary nodes normal  Neurologic:   CNII-XII intact. Normal strength, sensation and reflexes      throughout     Last depression screening scores PHQ 2/9 Scores 06/12/2021 02/09/2021 10/27/2020  PHQ - 2 Score 0 0  0  PHQ- 9 Score 0 - 1   Last fall risk screening Fall Risk  06/12/2021  Falls in the past year? 0  Number falls in past yr: 0  Injury with Fall? 0  Risk for fall due to : No Fall Risks  Follow up Falls evaluation completed   Last Audit-C alcohol use screening Alcohol Use Disorder Test (AUDIT) 06/12/2021  1. How often do you have a drink containing alcohol? 0  2. How many drinks containing alcohol do you have on a typical day when you are drinking? 0  3. How often do you have six or more drinks on one occasion? 0  AUDIT-C Score 0   A score of 3 or more in women, and 4 or more in men indicates increased risk for alcohol abuse, EXCEPT if all of the points are from question 1   No results found for any visits on 06/12/21.  Assessment & Plan    Routine Health Maintenance and Physical Exam  Exercise Activities and Dietary recommendations  Goals   None     Immunization History  Administered Date(s) Administered   Influenza,inj,Quad PF,6+ Mos 08/11/2017   Td 04/07/2016   Tdap 03/26/2016    Health Maintenance  Topic Date Due   COVID-19 Vaccine (1) Never done   Pneumococcal Vaccine 60-71 Years old (1 - PCV) Never done   Zoster Vaccines- Shingrix (1 of 2) Never done   INFLUENZA VACCINE  10/23/2021 (Originally 02/23/2021)   Fecal DNA (Cologuard)  05/31/2023   TETANUS/TDAP  04/07/2026   Hepatitis C Screening  Completed   HIV Screening  Completed   HPV VACCINES  Aged Out    Discussed health benefits of physical activity, and encouraged him to engage in regular exercise appropriate for his age and condition.  Flu vaccine was recommended but refused by patient  1. Primary hypertension Uncontrolled, not taking medications consistently.  Medications filled and he is going to start taking consistently.  - EKG 12-Lead  2. Tobacco abuse Smoking cessation encouraged.   3. Hyperlipidemia, unspecified hyperlipidemia type He is tolerating atorvastatin well with no adverse  effects.   - Lipid panel - CBC - Comprehensive metabolic panel  4. Chronic obstructive pulmonary disease, unspecified COPD type ([place]) Smoking cessation encouraged.   5. History of hemorrhagic stroke with residual hemiparesis (HCC) Reinforced important of controlled CVA risk factors particularly hypertension and smoking.   6. Heterozygous factor V Leiden mutation [facility])     The entirety of the information documented in the History of Present Illness, Review of Systems and Physical Exam were personally obtained by me. Portions of this information were initially documented by the CMA and reviewed by me for thoroughness and accuracy.     Lelon Huh, MD  [facility] [phone] (phone) [phone] (fax)  [place]

## 2021-06-13 ENCOUNTER — Other Ambulatory Visit: Payer: Self-pay | Admitting: Family Medicine

## 2021-06-13 DIAGNOSIS — I10 Essential (primary) hypertension: Secondary | ICD-10-CM

## 2021-06-13 DIAGNOSIS — E785 Hyperlipidemia, unspecified: Secondary | ICD-10-CM

## 2021-06-13 LAB — COMPREHENSIVE METABOLIC PANEL
ALT: 30 IU/L (ref 0–44)
AST: 30 IU/L (ref 0–40)
Albumin/Globulin Ratio: 1.6 (ref 1.2–2.2)
Albumin: 4.4 g/dL (ref 3.8–4.9)
Alkaline Phosphatase: 95 IU/L (ref 44–121)
BUN/Creatinine Ratio: 10 (ref 9–20)
BUN: 10 mg/dL (ref 6–24)
Bilirubin Total: 0.7 mg/dL (ref 0.0–1.2)
CO2: 26 mmol/L (ref 20–29)
Calcium: 9.6 mg/dL (ref 8.7–10.2)
Chloride: 102 mmol/L (ref 96–106)
Creatinine, Ser: 1.04 mg/dL (ref 0.76–1.27)
Globulin, Total: 2.7 g/dL (ref 1.5–4.5)
Glucose: 89 mg/dL (ref 70–99)
Potassium: 4.6 mmol/L (ref 3.5–5.2)
Sodium: 141 mmol/L (ref 134–144)
Total Protein: 7.1 g/dL (ref 6.0–8.5)
eGFR: 85 mL/min/{1.73_m2} (ref >59)

## 2021-06-13 LAB — LIPID PANEL
Chol/HDL Ratio: 5.7 ratio — ABNORMAL HIGH (ref 0.0–5.0)
Cholesterol, Total: 243 mg/dL — ABNORMAL HIGH (ref 100–199)
HDL: 43 mg/dL (ref >39)
LDL Chol Calc (NIH): 177 mg/dL — ABNORMAL HIGH (ref 0–99)
Triglycerides: 126 mg/dL (ref 0–149)
VLDL Cholesterol Cal: 23 mg/dL (ref 5–40)

## 2021-06-13 LAB — CBC
Hematocrit: 52.8 % — ABNORMAL HIGH (ref 37.5–51.0)
Hemoglobin: 18.4 g/dL — ABNORMAL HIGH (ref 13.0–17.7)
MCH: 31.7 pg (ref 26.6–33.0)
MCHC: 34.8 g/dL (ref 31.5–35.7)
MCV: 91 fL (ref 79–97)
Platelets: 245 10*3/uL (ref 150–450)
RBC: 5.8 x10E6/uL (ref 4.14–5.80)
RDW: 11.9 % (ref 11.6–15.4)
WBC: 11.7 10*3/uL — ABNORMAL HIGH (ref 3.4–10.8)

## 2021-06-13 MED ORDER — ATORVASTATIN CALCIUM 40 MG PO TABS: 80.0000 mg | ORAL_TABLET | Freq: Every day | ORAL | Status: DC

## 2021-06-13 MED ORDER — LOSARTAN POTASSIUM 100 MG PO TABS: 100.0000 mg | ORAL_TABLET | Freq: Every day | ORAL | 1 refills | Status: DC

## 2021-06-29 ENCOUNTER — Encounter: Payer: Self-pay | Admitting: Family Medicine

## 2021-06-30 NOTE — Telephone Encounter (Signed)
I called patient and advised him that the prescription was sent in on 06/08/2021. I called pharmacy and confirmed that the prescription was received.

## 2021-07-14 ENCOUNTER — Other Ambulatory Visit: Payer: Self-pay

## 2021-07-14 ENCOUNTER — Ambulatory Visit (INDEPENDENT_AMBULATORY_CARE_PROVIDER_SITE_OTHER): Payer: 59 | Admitting: Family Medicine

## 2021-07-14 ENCOUNTER — Encounter: Payer: Self-pay | Admitting: Family Medicine

## 2021-07-14 VITALS — BP 173/100 | HR 80 | Temp 99.1°F | Resp 18 | Wt 227.0 lb

## 2021-07-14 DIAGNOSIS — I10 Essential (primary) hypertension: Secondary | ICD-10-CM

## 2021-07-14 DIAGNOSIS — E785 Hyperlipidemia, unspecified: Secondary | ICD-10-CM

## 2021-07-14 DIAGNOSIS — J441 Chronic obstructive pulmonary disease with (acute) exacerbation: Secondary | ICD-10-CM

## 2021-07-14 MED ORDER — LOSARTAN POTASSIUM 100 MG PO TABS: 100.0000 mg | ORAL_TABLET | Freq: Every day | ORAL | 1 refills | Status: DC

## 2021-07-14 MED ORDER — ALBUTEROL SULFATE HFA 108 (90 BASE) MCG/ACT IN AERS: 2.0000 | INHALATION_SPRAY | Freq: Four times a day (QID) | RESPIRATORY_TRACT | 3 refills | Status: DC | PRN

## 2021-07-14 MED ORDER — AMLODIPINE BESYLATE 10 MG PO TABS: 10.0000 mg | ORAL_TABLET | Freq: Every evening | ORAL | 1 refills | Status: DC

## 2021-07-14 NOTE — Patient Instructions (Addendum)
Please review the attached list of medications and notify my office if there are any errors.   Please check the dose of the losartan you are taking. You are supposed to be taking 100mg  every day. If you have 50mg  tablet then take 2 tablet every day. A printed prescription has been sent with you today.  Increase amlodipine to 10mg  every day. You can take 2 of the 5mg  tablets every day until they are gone, then change to the 10mg  tablets. A printed prescription has been sent with you today.  Cut back on caffeinated coffee to no more than 1/2 pot a day

## 2021-07-14 NOTE — Progress Notes (Signed)
Established patient visit   Patient: Jon Yang   DOB: [birth year]   55 y.o. Male  MRN: [id number] Visit Date: 07/14/2021  Today's healthcare provider: Lelon Huh, MD   Chief Complaint  Patient presents with   Hypertension   Hyperlipidemia   Subjective    HPI  Hypertension, follow-up  BP Readings from Last 3 Encounters:  07/14/21 (!) 173/100  06/12/21 (!) 190/115  05/27/21 (!) 170/109   Wt Readings from Last 3 Encounters:  07/14/21 227 lb (103 kg)  06/12/21 228 lb 9.6 oz (103.7 kg)  05/27/21 230 lb (104.3 kg)     He was last seen for hypertension 4 weeks ago.  BP at that visit was 190/115. Management since that visit includes starting back on Losartan $RemoveBef'100mg'pgLamqgYDd$  daily. Patient was also counseled to take medications consistently.  He reports good compliance with treatment. He is having side effects. Has had headaches He is following a Regular diet. He is not exercising. He does smoke.  Use of agents associated with hypertension: NSAIDS.   Outside blood pressures are not checked. Symptoms: No chest pain No chest pressure  No palpitations No syncope  No dyspnea No orthopnea  No paroxysmal nocturnal dyspnea No lower extremity edema   Pertinent labs: Lab Results  Component Value Date   CHOL 243 (H) 06/12/2021   HDL 43 06/12/2021   LDLCALC 177 (H) 06/12/2021   TRIG 126 06/12/2021   CHOLHDL 5.7 (H) 06/12/2021   Lab Results  Component Value Date   NA 141 06/12/2021   K 4.6 06/12/2021   CREATININE 1.04 06/12/2021   EGFR 85 06/12/2021   GLUCOSE 89 06/12/2021     The ASCVD Risk score (Arnett DK, et al., 2019) failed to calculate for the following reasons:   The patient has a prior MI or stroke diagnosis   --------------------------------------------------------------------------------------------------- \  Lipid/Cholesterol, Follow-up  Last lipid panel Other pertinent labs  Lab Results  Component Value Date   CHOL 243 (H) 06/12/2021   HDL 43  06/12/2021   LDLCALC 177 (H) 06/12/2021   TRIG 126 06/12/2021   CHOLHDL 5.7 (H) 06/12/2021   Lab Results  Component Value Date   ALT 30 06/12/2021   AST 30 06/12/2021   PLT 245 06/12/2021     He was last seen for this 4 weeks ago.  Management since that visit includes increasing atorvastatin to $RemoveBeforeD'80mg'drDuoAnMquqYBQ$  once a day.  He reports good compliance with treatment. He is not having side effects.   Symptoms: No chest pain No chest pressure/discomfort  No dyspnea No lower extremity edema  No numbness or tingling of extremity No orthopnea  No palpitations No paroxysmal nocturnal dyspnea  No speech difficulty No syncope   Current diet: in general, an "unhealthy" diet Current exercise: none  The ASCVD Risk score (Arnett DK, et al., 2019) failed to calculate for the following reasons:   The patient has a prior MI or stroke diagnosis  ---------------------------------------------------------------------------------------------------   Medications: Outpatient Medications Prior to Visit  Medication Sig   albuterol (VENTOLIN HFA) 108 (90 Base) MCG/ACT inhaler Inhale 2 puffs into the lungs every 6 (six) hours as needed for wheezing or shortness of breath.   amLODipine (NORVASC) 5 MG tablet Take 1 tablet (5 mg total) by mouth every evening. PLEASE NOTE CHANGE TO $RemoveB'5MG'pBtZNUGK$  TABLETS   aspirin EC 81 MG EC tablet Take 1 tablet (81 mg total) by mouth daily. Swallow whole.   atorvastatin (LIPITOR) 40 MG tablet Take 2 tablets (80  mg total) by mouth daily.   B Complex Vitamins (VITAMIN B COMPLEX PO) Take 1 tablet by mouth daily as needed.   carvedilol (COREG) 12.5 MG tablet Take 1 tablet (12.5 mg total) by mouth 2 (two) times daily with a meal.   Cholecalciferol (D3-1000) 25 MCG (1000 UT) tablet Take 1 tablet (1,000 Units total) by mouth daily.   clopidogrel (PLAVIX) 75 MG tablet Take 1 tablet (75 mg total) by mouth daily.   fluticasone (FLONASE) 50 MCG/ACT nasal spray Place 1 spray into both nostrils 2 (two)  times daily.   ipratropium-albuterol (DUONEB) 0.5-2.5 (3) MG/3ML SOLN INHALE 3 MLS INTO THE LUNGS 4 (FOUR) TIMES DAILY AS NEEDED.   losartan (COZAAR) 100 MG tablet Take 1 tablet (100 mg total) by mouth daily.   meloxicam (MOBIC) 15 MG tablet Take 1 tablet (15 mg total) by mouth daily.   naphazoline-pheniramine (NAPHCON-A) 0.025-0.3 % ophthalmic solution Place 1 drop into the left eye 4 (four) times daily as needed for eye irritation.   Spacer/Aero-Holding Chambers (OPTICHAMBER DIAMOND-SM MASK) MISC Use with inhalers.   tobramycin (TOBREX) 0.3 % ophthalmic solution Place 1 drop into the left eye every 6 (six) hours.   TRELEGY ELLIPTA 100-62.5-25 MCG/INH AEPB INHALE 1 PUFF BY MOUTH EVERY DAY   No facility-administered medications prior to visit.    Review of Systems  Constitutional:  Negative for appetite change, chills and fever.  Respiratory:  Negative for chest tightness, shortness of breath and wheezing.   Cardiovascular:  Negative for chest pain and palpitations.  Gastrointestinal:  Negative for abdominal pain, nausea and vomiting.      Objective    BP (!) 173/100 (BP Location: Left Arm, Patient Position: Sitting, Cuff Size: Large)    Pulse 80    Temp 99.1 F (37.3 C) (Oral)    Resp 18    Wt 227 lb (103 kg)    SpO2 97% Comment: room air   BMI 32.57 kg/m  {Show previous vital signs (optional):23777}  Physical Exam   General: Appearance:    Mildly obese male in no acute distress  Eyes:    PERRL, conjunctiva/corneas clear, EOM's intact       Lungs:     Clear to auscultation bilaterally, respirations unlabored  Heart:    Normal heart rate. Normal rhythm. No murmurs, rubs, or gallops.    MS:   All extremities are intact.    Neurologic:   Awake, alert, oriented x 3. No apparent focal neurological defect.          Assessment & Plan     1. Primary hypertension Is now being compliant with medications with some BP improvement, but not to goal . Currently taking 2 x 25m losartan,  given printed prescription for losartan (COZAAR) 100 MG tablet; Take 1 tablet (100 mg total) by mouth daily.  Dispense: 90 tablet; Refill: 1 Increase from $RemoveBefor'5mg'GwxqJAtWzcFy$  to amLODipine (NORVASC) 10 MG tablet; Take 1 tablet (10 mg total) by mouth every evening.   2. COPD exacerbation (HCC) refill albuterol (VENTOLIN HFA) 108 (90 Base) MCG/ACT inhaler; Inhale 2 puffs into the lungs every 6 (six) hours as needed for wheezing or shortness of breath.  Dispense: 8 g; Refill: 3  3. Hyperlipidemia, unspecified hyperlipidemia type He is tolerating increase dose of atorvastatin well with no adverse effects.    Will check lipids at follow up in about 6 weeks.   Future Appointments  Date Time Provider [place]  08/25/2021  3:40 PM Caryn Section, Kirstie Peri, MD [facility]  09/15/2021  3:00 PM Thamas Appleyard, Kirstie Peri, MD [facility]        The entirety of the information documented in the History of Present Illness, Review of Systems and Physical Exam were personally obtained by me. Portions of this information were initially documented by the CMA and reviewed by me for thoroughness and accuracy.     Lelon Huh, MD  [facility] [phone] (phone) [phone] (fax)  [place]

## 2021-08-13 NOTE — Telephone Encounter (Signed)
Attempted to schedule no ans no vm  

## 2021-08-24 NOTE — Progress Notes (Deleted)
Established patient visit   Patient: Jon Yang   DOB: 1965/09/07   56 y.o. Male  MRN: [id number] Visit Date: 08/25/2021  Today's healthcare provider: Lelon Huh, MD   No chief complaint on file.  Subjective    HPI  Hypertension, follow-up  BP Readings from Last 3 Encounters:  07/14/21 (!) 173/100  06/12/21 (!) 190/115  05/27/21 (!) 170/109   Wt Readings from Last 3 Encounters:  07/14/21 227 lb (103 kg)  06/12/21 228 lb 9.6 oz (103.7 kg)  05/27/21 230 lb (104.3 kg)     He was last seen for hypertension {NUMBERS 1-12:18279} {days/wks/mos/yrs:310907} ago.  BP at that visit was ***. Management since that visit includes ***.  He reports {excellent/good/fair/poor:19665} compliance with treatment. He {is/is not:9024} having side effects. {document side effects if present:1} He is following a {diet:21022986} diet. He {is/is not:9024} exercising. He {does/does not:200015} smoke.  Use of agents associated with hypertension: {bp agents assoc with hypertension:511::"none"}.   Outside blood pressures are {***enter patient reported home BP readings, or 'not being checked':1}. Symptoms: {Yes/No:20286} chest pain {Yes/No:20286} chest pressure  {Yes/No:20286} palpitations {Yes/No:20286} syncope  {Yes/No:20286} dyspnea {Yes/No:20286} orthopnea  {Yes/No:20286} paroxysmal nocturnal dyspnea {Yes/No:20286} lower extremity edema   Pertinent labs: Lab Results  Component Value Date   CHOL 243 (H) 06/12/2021   HDL 43 06/12/2021   LDLCALC 177 (H) 06/12/2021   TRIG 126 06/12/2021   CHOLHDL 5.7 (H) 06/12/2021   Lab Results  Component Value Date   NA 141 06/12/2021   K 4.6 06/12/2021   CREATININE 1.04 06/12/2021   EGFR 85 06/12/2021   GLUCOSE 89 06/12/2021     The ASCVD Risk score (Arnett DK, et al., 2019) failed to calculate for the following reasons:   The patient has a prior MI or stroke diagnosis    ---------------------------------------------------------------------------------------------------   Medications: Outpatient Medications Prior to Visit  Medication Sig   albuterol (VENTOLIN HFA) 108 (90 Base) MCG/ACT inhaler Inhale 2 puffs into the lungs every 6 (six) hours as needed for wheezing or shortness of breath.   amLODipine (NORVASC) 10 MG tablet Take 1 tablet (10 mg total) by mouth every evening. PLEASE NOTE CHANGE TO 10 MG TABLETS   aspirin EC 81 MG EC tablet Take 1 tablet (81 mg total) by mouth daily. Swallow whole.   atorvastatin (LIPITOR) 40 MG tablet Take 2 tablets (80 mg total) by mouth daily.   B Complex Vitamins (VITAMIN B COMPLEX PO) Take 1 tablet by mouth daily as needed.   carvedilol (COREG) 12.5 MG tablet Take 1 tablet (12.5 mg total) by mouth 2 (two) times daily with a meal.   Cholecalciferol (D3-1000) 25 MCG (1000 UT) tablet Take 1 tablet (1,000 Units total) by mouth daily.   clopidogrel (PLAVIX) 75 MG tablet Take 1 tablet (75 mg total) by mouth daily.   fluticasone (FLONASE) 50 MCG/ACT nasal spray Place 1 spray into both nostrils 2 (two) times daily.   ipratropium-albuterol (DUONEB) 0.5-2.5 (3) MG/3ML SOLN INHALE 3 MLS INTO THE LUNGS 4 (FOUR) TIMES DAILY AS NEEDED.   losartan (COZAAR) 100 MG tablet Take 1 tablet (100 mg total) by mouth daily.   meloxicam (MOBIC) 15 MG tablet Take 1 tablet (15 mg total) by mouth daily.   naphazoline-pheniramine (NAPHCON-A) 0.025-0.3 % ophthalmic solution Place 1 drop into the left eye 4 (four) times daily as needed for eye irritation.   Spacer/Aero-Holding Chambers (OPTICHAMBER DIAMOND-SM MASK) MISC Use with inhalers.   tobramycin (TOBREX) 0.3 % ophthalmic solution  Place 1 drop into the left eye every 6 (six) hours.   TRELEGY ELLIPTA 100-62.5-25 MCG/INH AEPB INHALE 1 PUFF BY MOUTH EVERY DAY   No facility-administered medications prior to visit.    Review of Systems  {Labs   Heme   Chem   Endocrine   Serology   Results Review  (optional):23779}   Objective    There were no vitals taken for this visit. {Show previous vital signs (optional):23777}  Physical Exam  ***  No results found for any visits on 08/25/21.  Assessment & Plan     ***  No follow-ups on file.      Ar[place] Ponder Adrean Heitz,acting as a scribe for Lelon Huh, MD.,have documented all relevant documentation on the behalf of Lelon Huh, MD,as directed by  Lelon Huh, MD while in the presence of Lelon Huh, MD.  {provider attestation***:1}   Lelon Huh, MD  [facility] [phone] (phone) [phone] (fax)  [place]

## 2021-08-25 ENCOUNTER — Ambulatory Visit: Payer: 59 | Admitting: Family Medicine

## 2021-09-15 ENCOUNTER — Ambulatory Visit (INDEPENDENT_AMBULATORY_CARE_PROVIDER_SITE_OTHER): Payer: 59 | Admitting: Family Medicine

## 2021-09-15 ENCOUNTER — Other Ambulatory Visit: Payer: Self-pay

## 2021-09-15 ENCOUNTER — Encounter: Payer: Self-pay | Admitting: Family Medicine

## 2021-09-15 VITALS — BP 150/91 | HR 67 | Temp 97.8°F | Resp 16 | Ht 70.0 in | Wt 224.8 lb

## 2021-09-15 DIAGNOSIS — E785 Hyperlipidemia, unspecified: Secondary | ICD-10-CM

## 2021-09-15 DIAGNOSIS — I10 Essential (primary) hypertension: Secondary | ICD-10-CM

## 2021-09-15 DIAGNOSIS — M5432 Sciatica, left side: Secondary | ICD-10-CM | POA: Diagnosis not present

## 2021-09-15 NOTE — Progress Notes (Signed)
Established patient visit   Patient: Jon Yang   DOB: April 07, 1966   56 y.o. Male  MRN: [id number] Visit Date: 09/15/2021  Today's healthcare provider: Lelon Huh, MD   No chief complaint on file.  Subjective    HPI  Hypertension, follow-up  BP Readings from Last 3 Encounters:  09/15/21 (!) 150/91  07/14/21 (!) 173/100  06/12/21 (!) 190/115   Wt Readings from Last 3 Encounters:  09/15/21 224 lb 12.8 oz (102 kg)  07/14/21 227 lb (103 kg)  06/12/21 228 lb 9.6 oz (103.7 kg)     He was last seen for hypertension 3 months ago.  BP at that visit was 173/100. Management since that visit includes; Is now being compliant with medications with some BP improvement, but not to goal . Currently taking 2 x 60m losartan, given printed prescription for losartan (COZAAR) 100 MG tablet; Take 1 tablet (100 mg total) by mouth daily.  He reports excellent compliance with treatment. He is not having side effects.  He is following a Regular diet. He is not exercising. He does smoke.  Outside blood pressures are not being checked.  ---------------------------------------------------------------------------------------------------  Lipid/Cholesterol, Follow-up  Last lipid panel Other pertinent labs  Lab Results  Component Value Date   CHOL 243 (H) 06/12/2021   HDL 43 06/12/2021   LDLCALC 177 (H) 06/12/2021   TRIG 126 06/12/2021   CHOLHDL 5.7 (H) 06/12/2021   Lab Results  Component Value Date   ALT 30 06/12/2021   AST 30 06/12/2021   PLT 245 06/12/2021     He was last seen for this 3 months ago.  Management since that visit includes; He is tolerating increase dose of atorvastatin well with no adverse effects.    Will check lipids at follow up in about 6 weeks.  He reports excellent compliance with treatment. He is not having side effects.    The ASCVD Risk score (Arnett DK, et al., 2019) failed to calculate for the following reasons:   The patient has a prior MI or  stroke diagnosis  ---------------------------------------------------------------------------------------------------   Medications: Outpatient Medications Prior to Visit  Medication Sig Note   albuterol (VENTOLIN HFA) 108 (90 Base) MCG/ACT inhaler Inhale 2 puffs into the lungs every 6 (six) hours as needed for wheezing or shortness of breath.    amLODipine (NORVASC) 10 MG tablet Take 1 tablet (10 mg total) by mouth every evening. PLEASE NOTE CHANGE TO 10 MG TABLETS    aspirin EC 81 MG EC tablet Take 1 tablet (81 mg total) by mouth daily. Swallow whole.    atorvastatin (LIPITOR) 40 MG tablet Take 2 tablets (80 mg total) by mouth daily.    B Complex Vitamins (VITAMIN B COMPLEX PO) Take 1 tablet by mouth daily as needed.    carvedilol (COREG) 12.5 MG tablet Take 1 tablet (12.5 mg total) by mouth 2 (two) times daily with a meal.    Cholecalciferol (D3-1000) 25 MCG (1000 UT) tablet Take 1 tablet (1,000 Units total) by mouth daily.    clopidogrel (PLAVIX) 75 MG tablet Take 1 tablet (75 mg total) by mouth daily.    fluticasone (FLONASE) 50 MCG/ACT nasal spray Place 1 spray into both nostrils 2 (two) times daily.    ipratropium-albuterol (DUONEB) 0.5-2.5 (3) MG/3ML SOLN INHALE 3 MLS INTO THE LUNGS 4 (FOUR) TIMES DAILY AS NEEDED.    losartan (COZAAR) 100 MG tablet Take 1 tablet (100 mg total) by mouth daily.    naphazoline-pheniramine (NAPHCON-A) 0.025-0.3 %  ophthalmic solution Place 1 drop into the left eye 4 (four) times daily as needed for eye irritation.    Spacer/Aero-Holding Chambers (OPTICHAMBER DIAMOND-SM MASK) MISC Use with inhalers.    tobramycin (TOBREX) 0.3 % ophthalmic solution Place 1 drop into the left eye every 6 (six) hours.    TRELEGY ELLIPTA 100-62.5-25 MCG/INH AEPB INHALE 1 PUFF BY MOUTH EVERY DAY    [DISCONTINUED] meloxicam (MOBIC) 15 MG tablet Take 1 tablet (15 mg total) by mouth daily. 09/15/2021: has been out since last summer 2022   No facility-administered medications prior  to visit.    Review of Systems  Eyes:  Negative for visual disturbance.  Cardiovascular:  Negative for chest pain and leg swelling.  Neurological:  Negative for headaches.  All other systems reviewed and are negative.     Objective    BP (!) 150/91 (BP Location: Right Arm, Patient Position: Sitting, Cuff Size: Large)    Pulse 67    Temp 97.8 F (36.6 C) (Oral)    Resp 16    Ht 5\' 10"  (1.778 m)    Wt 224 lb 12.8 oz (102 kg)    BMI 32.26 kg/m  {Show previous vital signs (optional):23777}  General appearance: Mildly obese male, cooperative and in no acute distress Head: Normocephalic, without obvious abnormality, atraumatic Respiratory: Respirations even and unlabored, normal respiratory rate Extremities: All extremities are intact.  Skin: Skin color, texture, turgor normal. No rashes seen  Psych: Appropriate mood and affect. Neurologic: Mental status: Alert, oriented to person, place, and time, thought content appropriate.     Assessment & Plan     1. Primary hypertension Not at goal, but much better with change in dose of amlodipine and losartan.  - Comprehensive metabolic panel - TSH  2. Hyperlipidemia, unspecified hyperlipidemia type He is tolerating atorvastatin well with no adverse effects.   - Comprehensive metabolic panel - Lipid panel  3. Sciatica of left side He states he frequently gets a pain down the back of his left leg when sitting for prolonged periods, although doesn't have back pain. Discussed trial of gi friendly NSAID such as celecoxib. He would first like to try a mechanical treatment such as a leg strap.       The entirety of the information documented in the History of Present Illness, Review of Systems and Physical Exam were personally obtained by me. Portions of this information were initially documented by the CMA and reviewed by me for thoroughness and accuracy.     Lelon Huh, MD  [facility] [phone] (phone) [phone]  (fax)  [place]

## 2021-09-16 LAB — COMPREHENSIVE METABOLIC PANEL
ALT: 29 IU/L (ref 0–44)
AST: 31 IU/L (ref 0–40)
Albumin/Globulin Ratio: 1.5 (ref 1.2–2.2)
Albumin: 4.2 g/dL (ref 3.8–4.9)
Alkaline Phosphatase: 112 IU/L (ref 44–121)
BUN/Creatinine Ratio: 13 (ref 9–20)
BUN: 15 mg/dL (ref 6–24)
Bilirubin Total: 1.1 mg/dL (ref 0.0–1.2)
CO2: 17 mmol/L — ABNORMAL LOW (ref 20–29)
Calcium: 9.1 mg/dL (ref 8.7–10.2)
Chloride: 105 mmol/L (ref 96–106)
Creatinine, Ser: 1.12 mg/dL (ref 0.76–1.27)
Globulin, Total: 2.8 g/dL (ref 1.5–4.5)
Glucose: 101 mg/dL — ABNORMAL HIGH (ref 70–99)
Potassium: 4.7 mmol/L (ref 3.5–5.2)
Sodium: 140 mmol/L (ref 134–144)
Total Protein: 7 g/dL (ref 6.0–8.5)
eGFR: 78 mL/min/{1.73_m2} (ref >59)

## 2021-09-16 LAB — LIPID PANEL
Chol/HDL Ratio: 4.2 ratio (ref 0.0–5.0)
Cholesterol, Total: 160 mg/dL (ref 100–199)
HDL: 38 mg/dL — ABNORMAL LOW (ref >39)
LDL Chol Calc (NIH): 97 mg/dL (ref 0–99)
Triglycerides: 139 mg/dL (ref 0–149)
VLDL Cholesterol Cal: 25 mg/dL (ref 5–40)

## 2021-09-16 LAB — TSH: TSH: 2.22 u[IU]/mL (ref 0.450–4.500)

## 2021-09-21 ENCOUNTER — Other Ambulatory Visit: Payer: Self-pay | Admitting: Family Medicine

## 2021-09-21 ENCOUNTER — Ambulatory Visit: Payer: Self-pay | Admitting: Physician Assistant

## 2021-09-21 ENCOUNTER — Encounter: Payer: Self-pay | Admitting: Physician Assistant

## 2021-09-21 ENCOUNTER — Other Ambulatory Visit: Payer: Self-pay

## 2021-09-21 VITALS — Temp 97.6°F | Resp 14 | Ht 70.0 in | Wt 224.0 lb

## 2021-09-21 DIAGNOSIS — M5440 Lumbago with sciatica, unspecified side: Secondary | ICD-10-CM

## 2021-09-21 DIAGNOSIS — E785 Hyperlipidemia, unspecified: Secondary | ICD-10-CM

## 2021-09-21 NOTE — Progress Notes (Signed)
3x months Dull pain going through left leg when he sits down and makes his knee throb. Standing up painful to weigh on leg/

## 2021-09-21 NOTE — Progress Notes (Signed)
° °  Subjective: Left leg pain    Patient ID: Jon Yang, male    DOB: 02-Jan-1966, 56 y.o.   MRN: [id number]  HPI Patient complains of 3 months of left back pain with radicular component to the left lower extremity.  Initially pain radiated to the posterior thigh but denies descended to the knee and calf.  Patient denies bladder or bowel dysfunction.  Patient did pain increased with prolonged sitting.  Patient states pain decreased with stretching.  Rates the pain as a 5/5.  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  Review of Systems Hypertension, hyperlipidemia, and migraine headaches.    Objective:   Physical Exam  Temperature 97.6, respiration 14, BP 166/100. No acute distress.  No obvious deformity of the lumbar spine.  Patient has mild guarding palpation of L4-S1.  Patient had negative straight leg test.  Patient had full and equal range of motion of the lower extremities.     Assessment & Plan: Radicular back pain.  Further evaluation with x-ray is warranted.  Patient will follow-up in 2 days status post imaging.

## 2021-09-22 ENCOUNTER — Ambulatory Visit
Admission: RE | Admit: 2021-09-22 | Discharge: 2021-09-22 | Disposition: A | Discharge status: Home / Self Care | Payer: 59 | Source: Ambulatory Visit | Attending: Physician Assistant | Admitting: Physician Assistant

## 2021-09-22 ENCOUNTER — Ambulatory Visit
Admission: RE | Admit: 2021-09-22 | Discharge: 2021-09-22 | Disposition: A | Discharge status: Home / Self Care | Payer: 59 | Attending: Physician Assistant | Admitting: Physician Assistant

## 2021-09-22 DIAGNOSIS — M5440 Lumbago with sciatica, unspecified side: Secondary | ICD-10-CM | POA: Insufficient documentation

## 2021-09-22 DIAGNOSIS — M5137 Other intervertebral disc degeneration, lumbosacral region: Secondary | ICD-10-CM | POA: Diagnosis not present

## 2021-09-22 DIAGNOSIS — M5136 Other intervertebral disc degeneration, lumbar region: Secondary | ICD-10-CM | POA: Diagnosis not present

## 2021-09-23 ENCOUNTER — Encounter: Payer: Self-pay | Admitting: Physician Assistant

## 2021-09-23 ENCOUNTER — Ambulatory Visit: Payer: Self-pay | Admitting: Physician Assistant

## 2021-09-23 ENCOUNTER — Other Ambulatory Visit: Payer: Self-pay

## 2021-09-23 ENCOUNTER — Other Ambulatory Visit: Payer: Self-pay | Admitting: Physician Assistant

## 2021-09-23 VITALS — BP 155/101 | HR 69 | Temp 97.6°F | Resp 14 | Ht 70.0 in | Wt 225.0 lb

## 2021-09-23 DIAGNOSIS — M5410 Radiculopathy, site unspecified: Secondary | ICD-10-CM

## 2021-09-23 MED ORDER — ORPHENADRINE CITRATE ER 100 MG PO TB12: 100.0000 mg | ORAL_TABLET | Freq: Two times a day (BID) | ORAL | 0 refills | Status: DC

## 2021-09-23 MED ORDER — NAPROXEN 500 MG PO TABS: 500.0000 mg | ORAL_TABLET | Freq: Two times a day (BID) | ORAL | Status: DC

## 2021-09-23 NOTE — Progress Notes (Signed)
Pt states pain has gotten a little better. ?

## 2021-09-23 NOTE — Progress Notes (Signed)
? ?  Subjective: Radicular back pain  ? ? Patient ID: Jon Yang, male    DOB: August 22, 1965, 56 y.o.   MRN: [id number] ? ?HPI ?Patient follow-up visit low back pain to the left lower extremity.  Patient states feeling better today.  Patient x-rays x-rays show degenerative changes lumbar spine. ? ? ?Review of Systems ?Hypertension and hyperlipidemia. ?   ?Objective:  ? Physical Exam ?No acute distress.  See nurses note for vital signs. ?Physical exam deferred. ? ? ?   ?Assessment & Plan: Radicular back pain  ? ?Discussed x-rays sequela findings.  Patient amenable to being treated episodically with muscle relaxers and anti-inflammatory medication.  Advise to return back immediately if experiencing any bladder or bowel dysfunction. ?

## 2021-10-23 ENCOUNTER — Other Ambulatory Visit: Payer: Self-pay | Admitting: Physician Assistant

## 2021-11-02 NOTE — Telephone Encounter (Signed)
Several attempts to schedule see also other encounters.  Closing encounter.  ?

## 2021-11-03 IMAGING — CT CT HEAD W/O CM
3 series · 15 of 47 positions shown, 18 images · non-contrast
Comparison: CT head 03/27/2017

CLINICAL DATA: Pt to ED via POV stating that he woke up this
morning with at 4044 with slurred speech and right sided numbness
and weakness. Pt states Pt states that he has hx/o brain bleed in
9144 that affected the left side. Pt unable to hold.*comment was
truncated*

EXAM:
CT HEAD WITHOUT CONTRAST
TECHNIQUE: Contiguous axial images were obtained from the base of the skull
through the vertex without intravenous contrast.

[Series 2: head wo · axial · 0.45mm/px · z∈[-148,-18]mm · 9 of 32 slices shown, 12 images]
[im 3/32  brain]
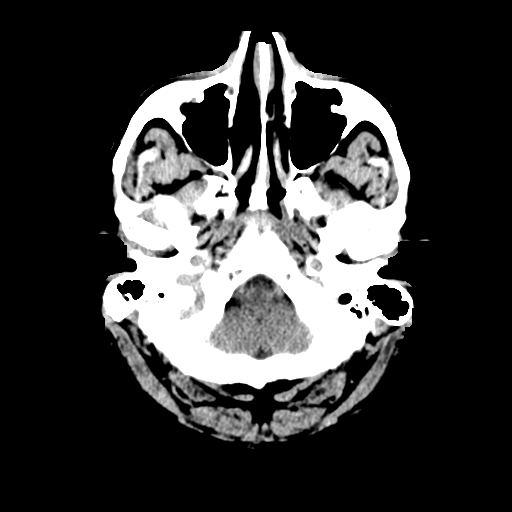
[im 3/32  bone]
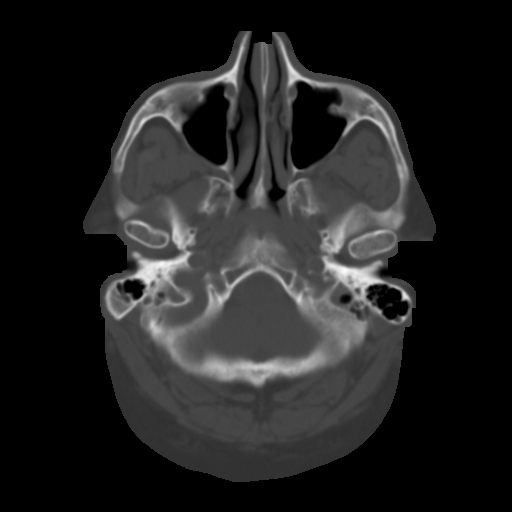
[im 6/32  brain]
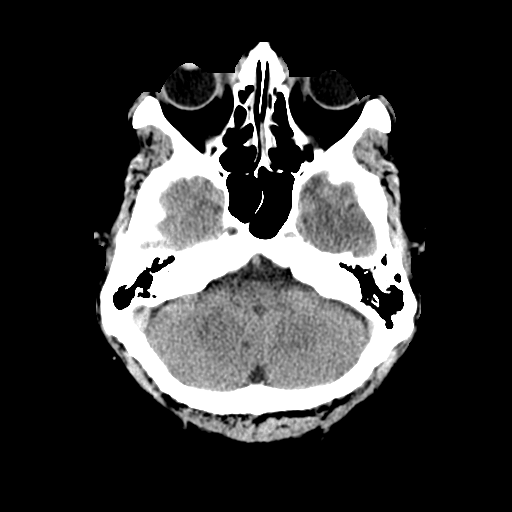
[im 9/32  brain]
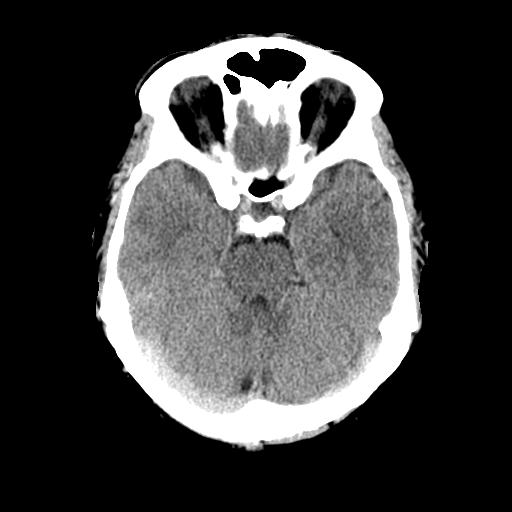
[im 12/32  brain]
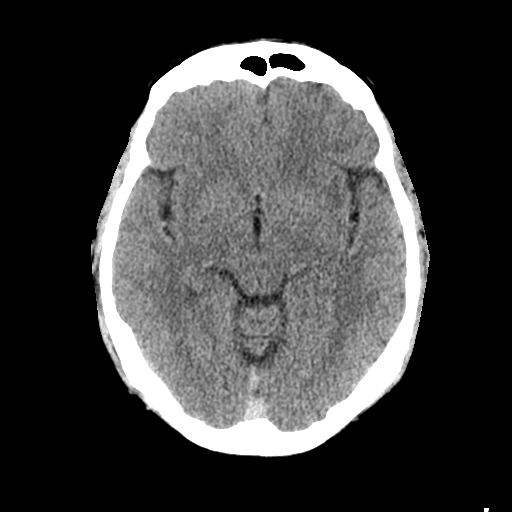
[im 17/32  brain]
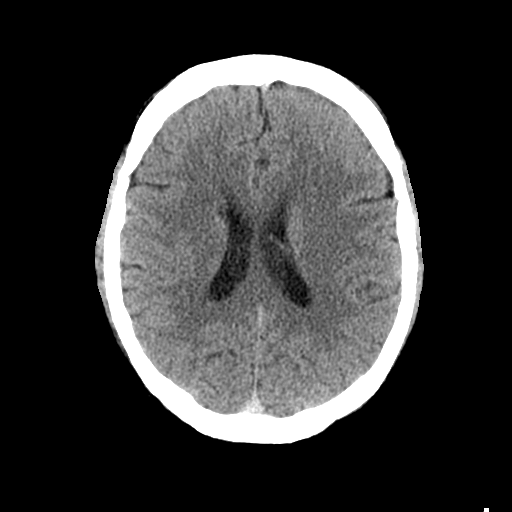
[im 17/32  bone]
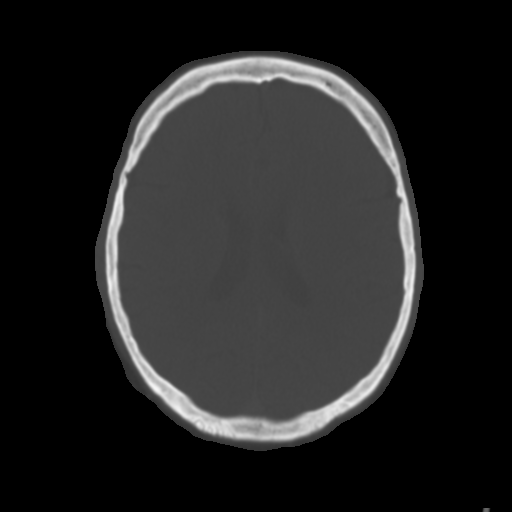
[im 20/32  brain]
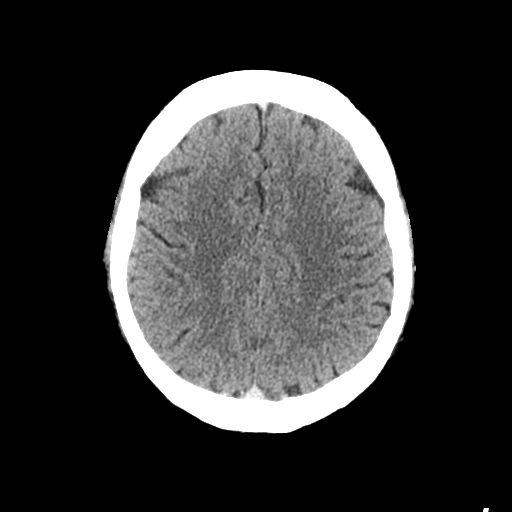
[im 23/32  brain]
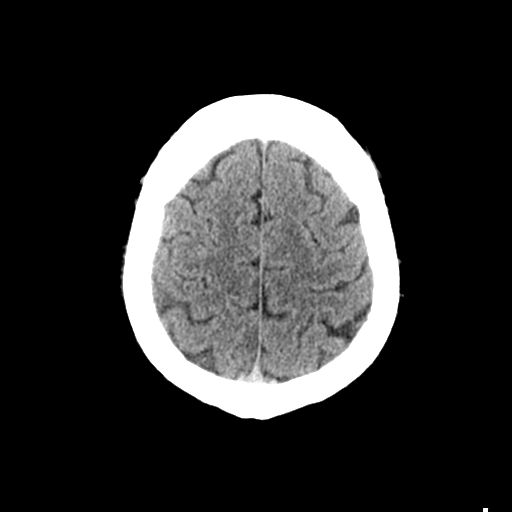
[im 26/32  brain]
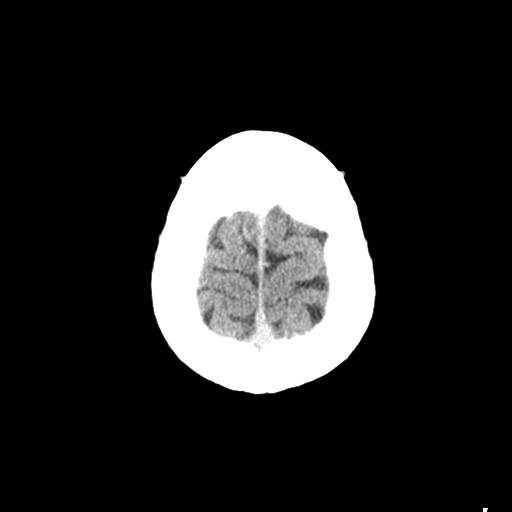
[im 29/32  brain]
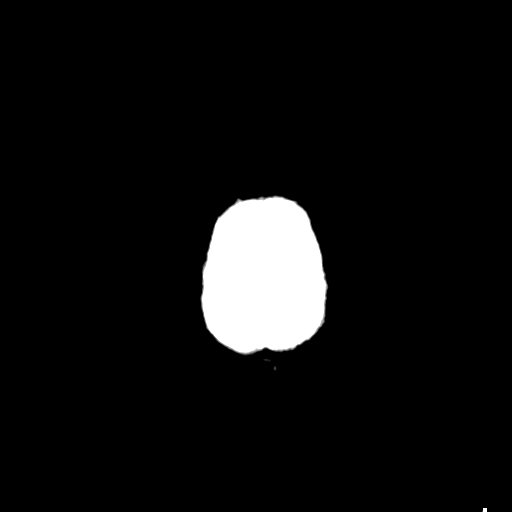
[im 29/32  bone]
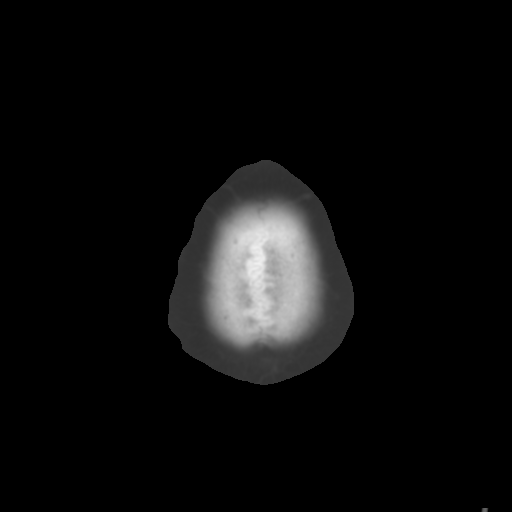

[Series 4: coronal soft tissue · coronal · 0.30mm/px · 3 of 64 slices shown]
[im 22/64  brain]
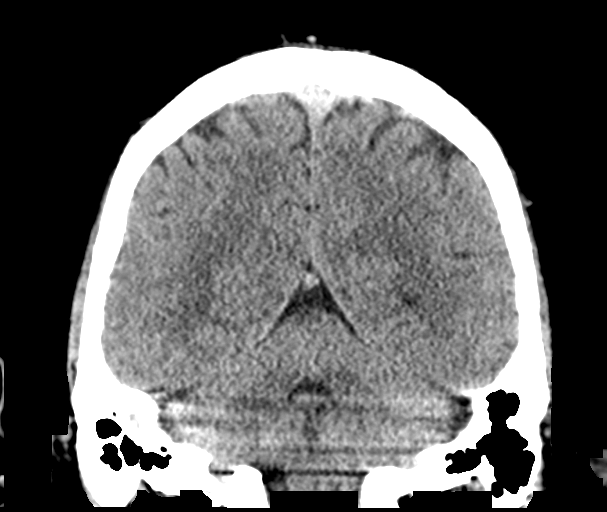
[im 29/64  brain]
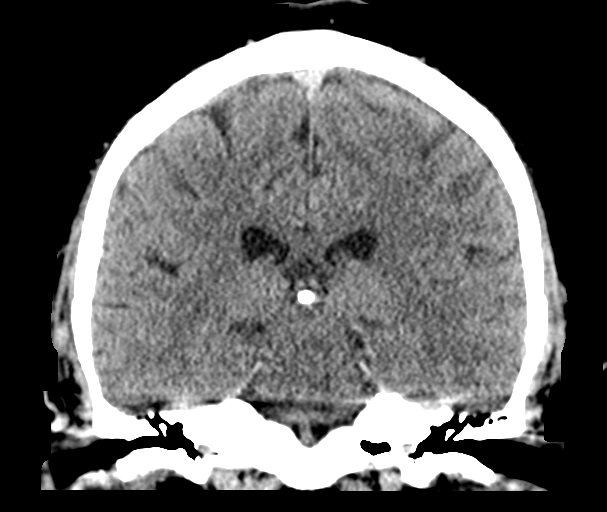
[im 36/64  brain]
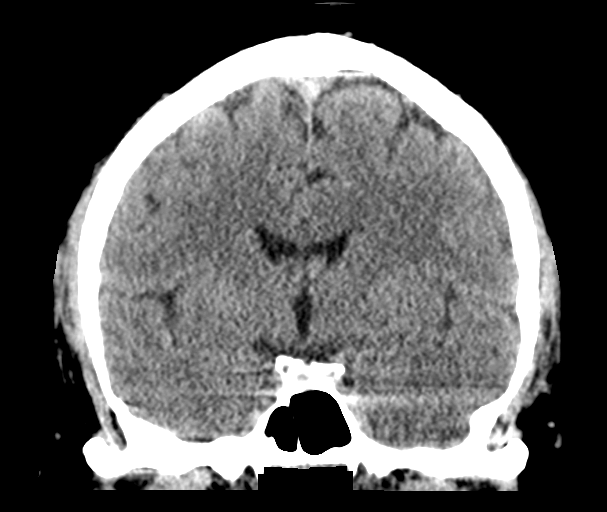

[Series 5: sagittal soft tissue · sagittal · 0.30mm/px · 3 of 57 slices shown]
[im 19/57  brain]
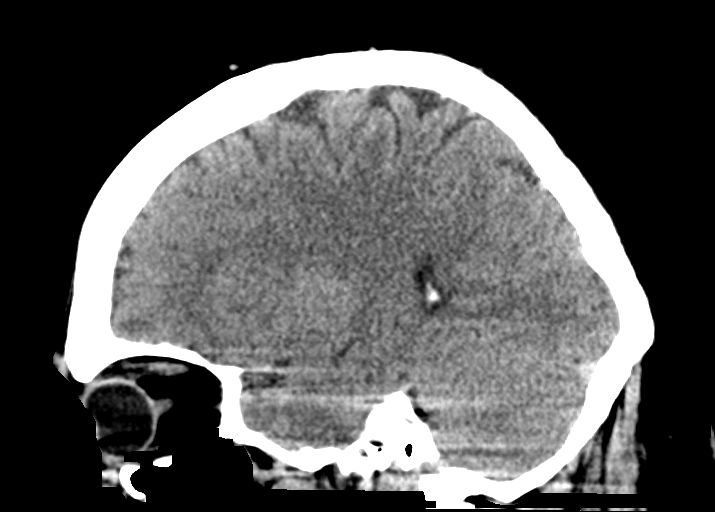
[im 29/57  brain]
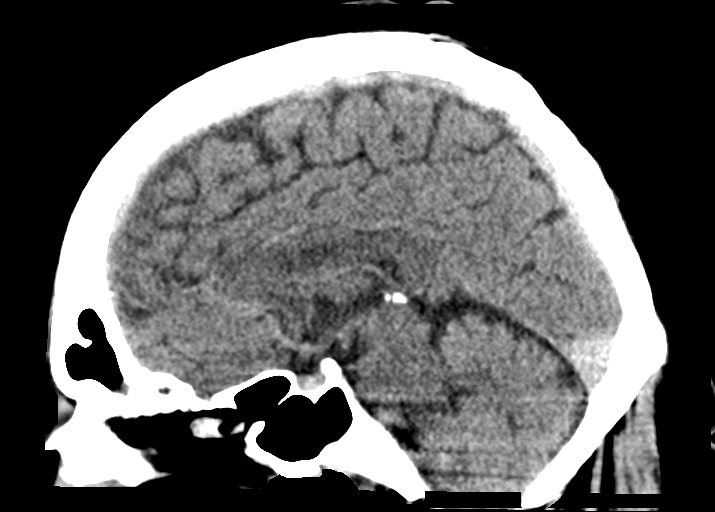
[im 38/57  brain]
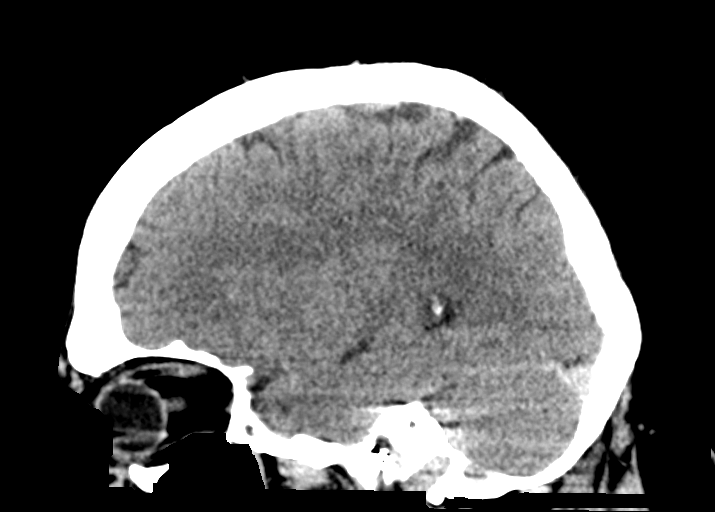

[15 of 47 positions shown; findings below may reference images not displayed]

FINDINGS: Brain: No evidence of acute infarction, hemorrhage, hydrocephalus,
extra-axial collection or mass lesion/mass effect.

Vascular: No hyperdense vessel or unexpected calcification.

Skull: Normal. Negative for fracture or focal lesion.

Sinuses/Orbits: Partial opacification of the right frontal sinus.
Otherwise sinuses are clear. Normal appearance of the orbits.

Other: None.
IMPRESSION: 1. No acute intracranial findings.
2. Right frontal sinus disease.

## 2021-11-03 IMAGING — MR MR HEAD W/O CM
9 of 10 series · 42 of 48 positions shown · non-contrast
Comparison: 6735

CLINICAL DATA: Right-sided weakness, resolved

EXAM:
MRI HEAD WITHOUT CONTRAST
TECHNIQUE: Multiplanar, multiecho pulse sequences of the brain and surrounding
structures were obtained without intravenous contrast.

[Series 2: ax dwi_tracew · axial · 3.0mm · 0.71mm/px · z∈[-109,+56]mm · 8 of 112 slices shown]
[im 1/112]
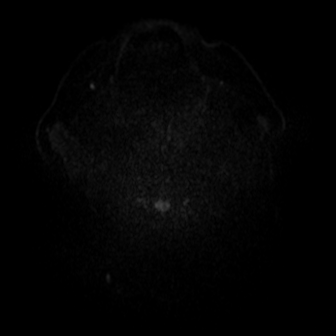
[im 13/112]
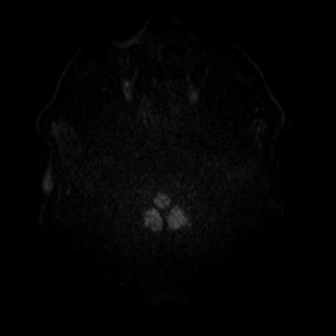
[im 38/112]
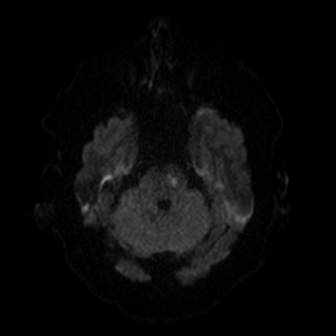
[im 50/112]
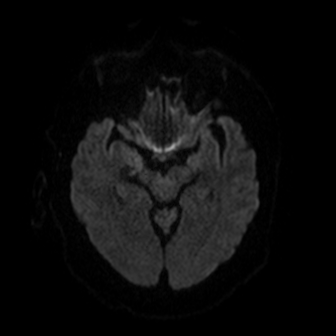
[im 62/112]
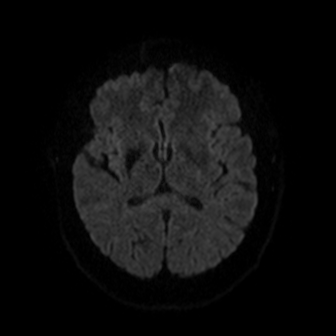
[im 75/112]
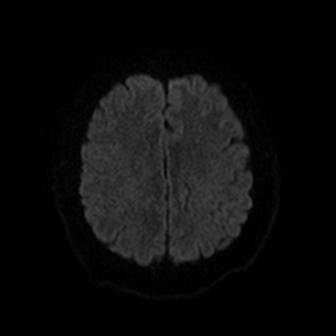
[im 99/112]
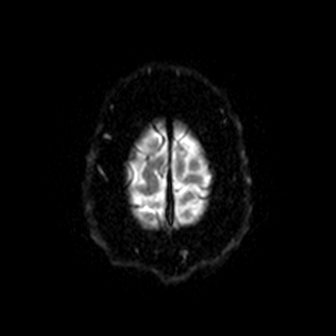
[im 112/112]
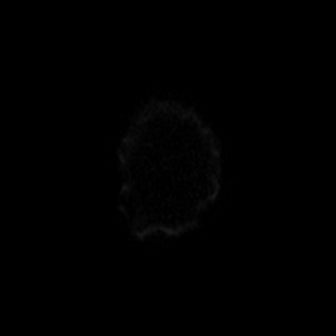

[Series 3: ax dwi_adc · axial · 3.0mm · 0.71mm/px · z∈[-109,+56]mm · 6 of 56 slices shown]
[im 1/56]
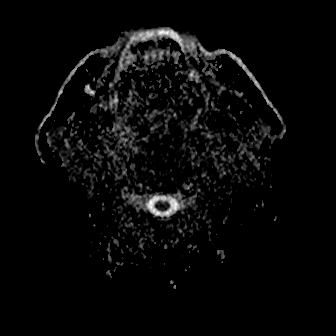
[im 12/56]
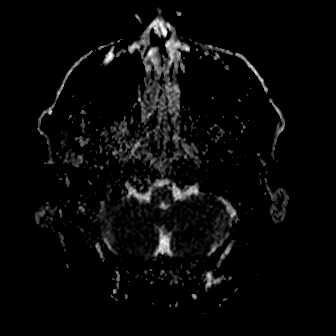
[im 23/56]
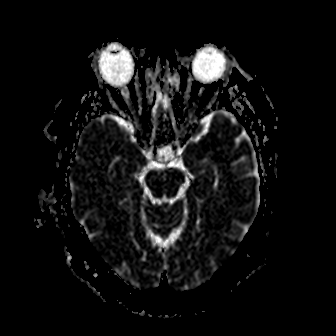
[im 34/56]
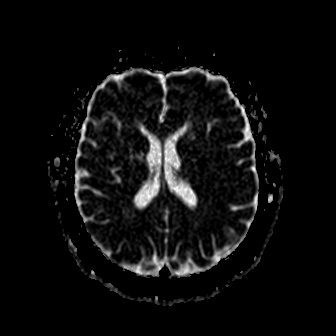
[im 45/56]
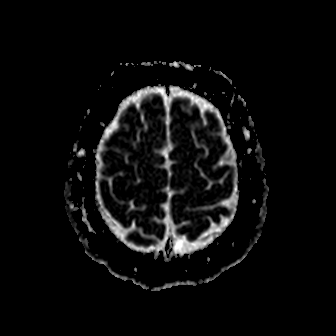
[im 56/56]
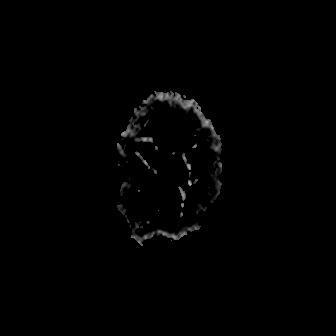

[Series 4: cor dwi_tracew · coronal · 5.0mm · 0.68mm/px · 8 of 80 slices shown]
[im 1/80]
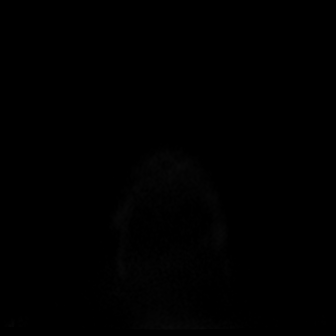
[im 12/80]
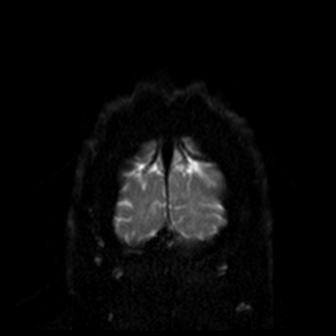
[im 23/80]
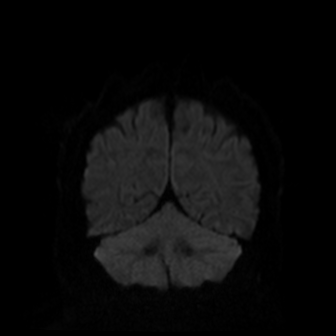
[im 34/80]
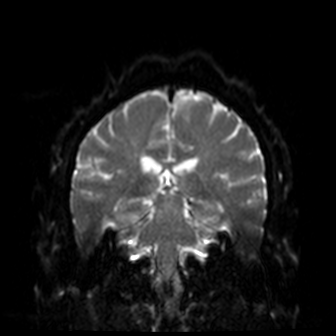
[im 46/80]
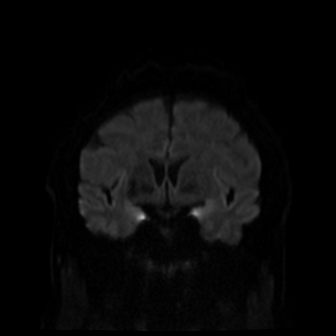
[im 57/80]
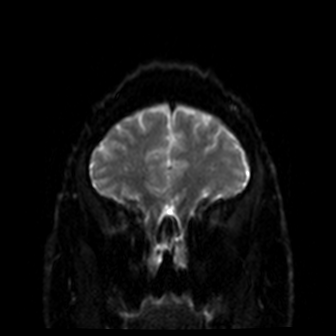
[im 68/80]
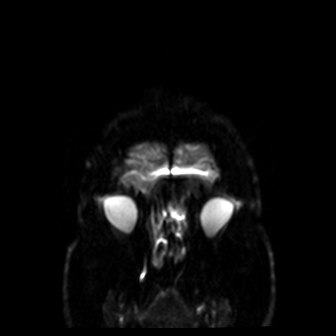
[im 80/80]
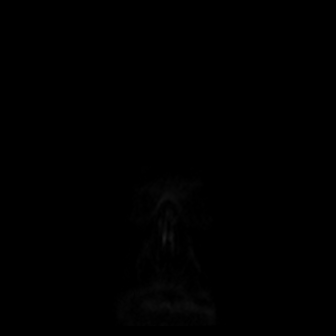

[Series 5: cor dwi_adc · coronal · 5.0mm · 0.68mm/px · 3 of 40 slices shown]
[im 1/40]
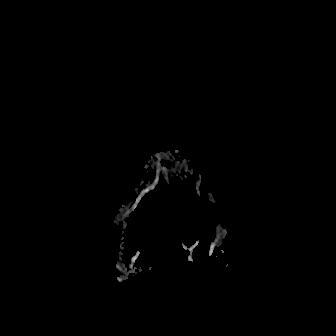
[im 14/40]
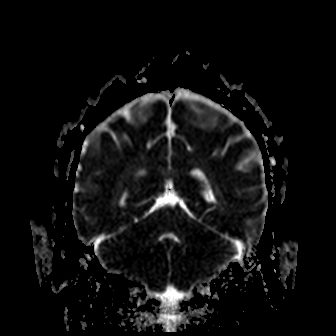
[im 27/40]
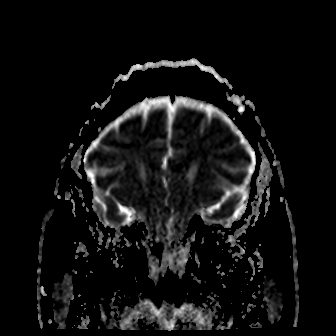

[Series 6: T2 · axial · 5.0mm · 0.45mm/px · z∈[-104,+52]mm · 3 of 27 slices shown (1 of 2)]
[im 1/27]
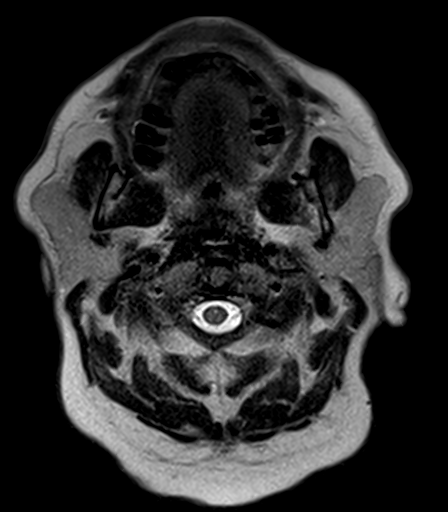
[im 14/27]
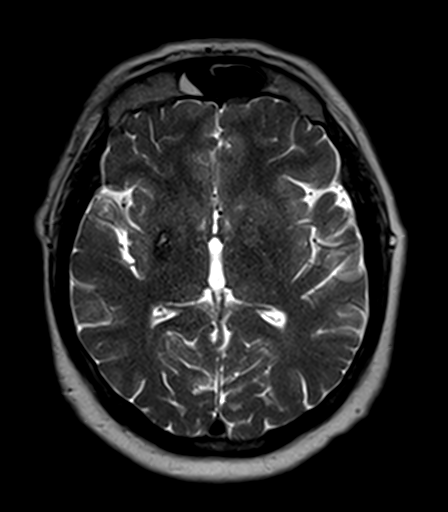
[im 27/27]
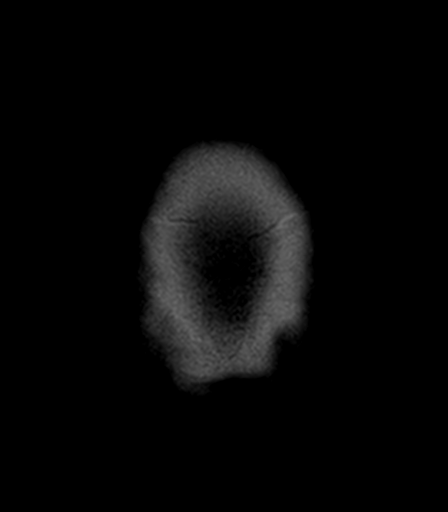

[Series 8: FLAIR · axial · 3.0mm · 0.69mm/px · z∈[-106,+55]mm · 6 of 55 slices shown]
[im 1/55]
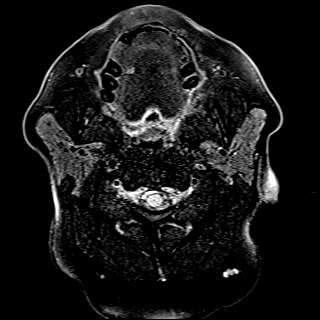
[im 11/55]
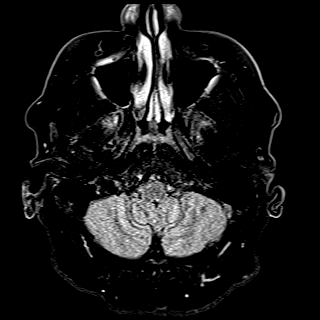
[im 22/55]
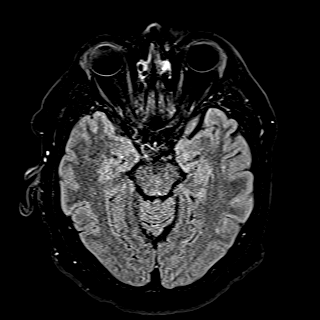
[im 33/55]
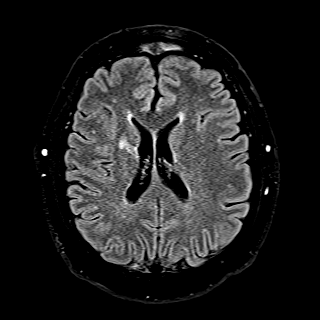
[im 44/55]
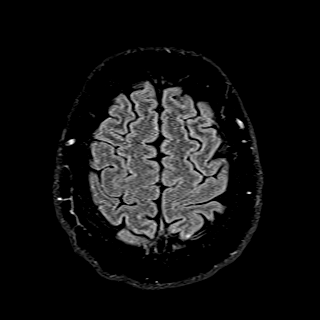
[im 55/55]
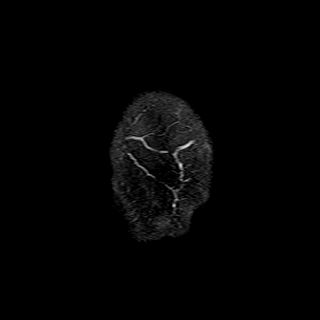

[Series 9: T1 · axial · 5.0mm · 0.90mm/px · z∈[-104,+52]mm · 3 of 27 slices shown (1 of 2)]
[im 1/27]
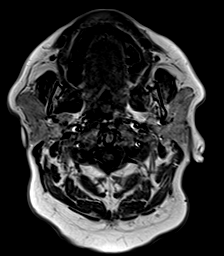
[im 14/27]
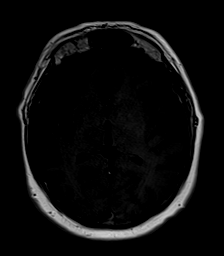
[im 27/27]
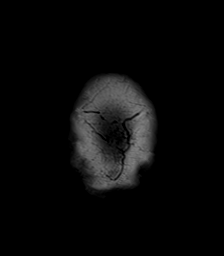

[Series 10: T1 · sagittal · 5.0mm · 0.94mm/px · 2 of 25 slices shown (2 of 2)]
[im 1/25]
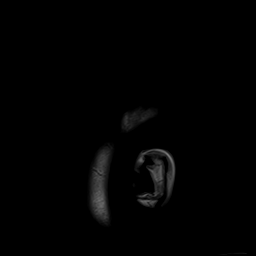
[im 25/25]
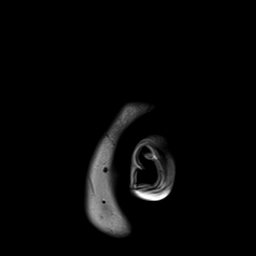

[Series 11: T2 · coronal · 5.0mm · 0.45mm/px · 3 of 31 slices shown (2 of 2)]
[im 1/31]
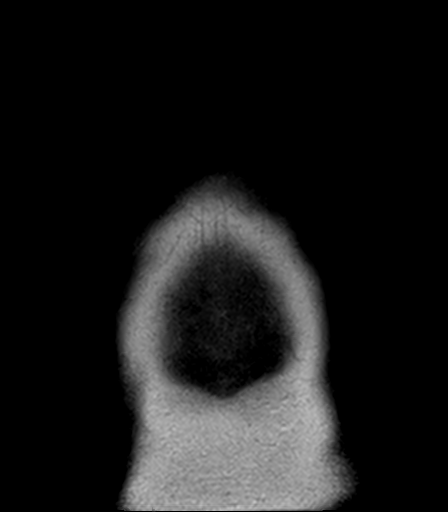
[im 16/31]
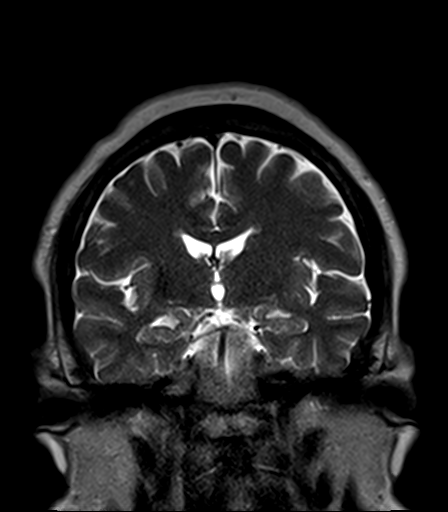
[im 31/31]
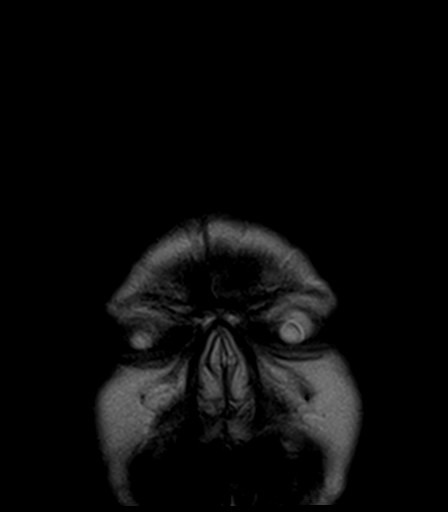

[42 of 48 positions shown; findings below may reference images not displayed]

FINDINGS: Brain: There is an 8 mm focus of reduced diffusion at the left
aspect of the pons.

There is no intracranial mass, mass effect, or edema. There is no
hydrocephalus or extra-axial fluid collection. Ventricles and sulci
are within normal limits in size and configuration. There is a
chronic hemorrhagic small vessel infarct of the right basal ganglia
and adjacent white matter. Few small foci of T2 hyperintensity in
the supratentorial white matter are nonspecific but may reflect
minor chronic microvascular ischemic changes.

Vascular: Major vessel flow voids at the skull base are preserved.

Skull and upper cervical spine: Normal marrow signal is preserved.

Sinuses/Orbits: Mild mucosal thickening.  Orbits are unremarkable.

Other: Sella is unremarkable.  Mastoid air cells are clear.
IMPRESSION: Small acute infarct of the left pons. Stable chronic findings
detailed above.

## 2021-11-03 NOTE — Telephone Encounter (Signed)
Unable to contact deleting recall.  ?

## 2021-11-05 NOTE — Telephone Encounter (Signed)
Noted     Closing encounter

## 2021-11-24 ENCOUNTER — Other Ambulatory Visit: Payer: Self-pay | Admitting: Family Medicine

## 2021-11-24 DIAGNOSIS — J441 Chronic obstructive pulmonary disease with (acute) exacerbation: Secondary | ICD-10-CM

## 2021-12-14 ENCOUNTER — Ambulatory Visit: Payer: Self-pay | Admitting: Physician Assistant

## 2021-12-14 ENCOUNTER — Encounter: Payer: Self-pay | Admitting: Physician Assistant

## 2021-12-14 DIAGNOSIS — S20462A Insect bite (nonvenomous) of left back wall of thorax, initial encounter: Secondary | ICD-10-CM

## 2021-12-14 DIAGNOSIS — W57XXXA Bitten or stung by nonvenomous insect and other nonvenomous arthropods, initial encounter: Secondary | ICD-10-CM

## 2021-12-14 MED ORDER — DOXYCYCLINE MONOHYDRATE 100 MG PO CAPS: 100.0000 mg | ORAL_CAPSULE | Freq: Two times a day (BID) | ORAL | 0 refills | Status: DC

## 2021-12-14 NOTE — Progress Notes (Signed)
   Subjective: Tick bite    Patient ID: Jon Yang, male    DOB: 1966/02/15, 56 y.o.   MRN: [id number]  HPI Patient presented with 2 tick bites.  First bite is on the left upper back and a second bite is on the left shoulder.  Patient states successfully remove by wife 1 week ago.  Now presents with edema and erythema.   Review of Systems Hyperlipidemia and hypertension    Objective:   Physical Exam  Papular lesion on erythematous base located.  Patient showed picture of the ticks.       Assessment & Plan: Tick bites   Patient will start doxycycline and follow-up in 2 weeks.

## 2022-01-19 DIAGNOSIS — H10503 Unspecified blepharoconjunctivitis, bilateral: Secondary | ICD-10-CM | POA: Diagnosis not present

## 2022-02-03 ENCOUNTER — Other Ambulatory Visit: Payer: Self-pay

## 2022-02-03 DIAGNOSIS — Z0283 Encounter for blood-alcohol and blood-drug test: Secondary | ICD-10-CM

## 2022-02-03 NOTE — Progress Notes (Signed)
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  [facility] Acct #:  [id number] [facility] Specimen #:  [id number]  Breath Alcohol Results = .000  AMD

## 2022-02-15 ENCOUNTER — Ambulatory Visit: Payer: 59 | Admitting: Family Medicine

## 2022-03-14 ENCOUNTER — Encounter: Payer: Self-pay | Admitting: Family Medicine

## 2022-03-14 ENCOUNTER — Other Ambulatory Visit: Payer: Self-pay | Admitting: Family Medicine

## 2022-03-14 DIAGNOSIS — I10 Essential (primary) hypertension: Secondary | ICD-10-CM

## 2022-03-26 ENCOUNTER — Ambulatory Visit (INDEPENDENT_AMBULATORY_CARE_PROVIDER_SITE_OTHER): Payer: 59 | Admitting: Family Medicine

## 2022-03-26 ENCOUNTER — Encounter: Payer: Self-pay | Admitting: Family Medicine

## 2022-03-26 VITALS — BP 140/96 | HR 90 | Temp 97.3°F | Resp 16 | Wt 231.2 lb

## 2022-03-26 DIAGNOSIS — I10 Essential (primary) hypertension: Secondary | ICD-10-CM

## 2022-03-26 MED ORDER — VALSARTAN 160 MG PO TABS: 160.0000 mg | ORAL_TABLET | Freq: Every day | ORAL | 1 refills | Status: DC

## 2022-03-26 NOTE — Patient Instructions (Signed)
Please review the attached list of medications and notify my office if there are any errors.   Please bring all of your medications to every appointment so we can make sure that our medication list is the same as yours.   Stop taking losartan, and start taking Valsartan 160mg  once a day in its place

## 2022-03-26 NOTE — Progress Notes (Unsigned)
I,Jana Robinson,acting as a scribe for Lelon Huh, MD.,have documented all relevant documentation on the behalf of Lelon Huh, MD,as directed by  Lelon Huh, MD while in the presence of Lelon Huh, MD.    Established patient visit   Patient: Jon Yang   DOB: [birth year]   56 y.o. Male  MRN: [id number] Visit Date: 03/26/2022  Today's healthcare provider: Lelon Huh, MD   Chief Complaint  Patient presents with   Hypertension   Subjective    HPI  Hypertension, follow-up  BP Readings from Last 3 Encounters:  03/26/22 (!) 140/96  09/23/21 (!) 155/101  09/15/21 (!) 150/91   Wt Readings from Last 3 Encounters:  03/26/22 231 lb 3.2 oz (104.9 kg)  09/23/21 225 lb (102.1 kg)  09/21/21 224 lb (101.6 kg)     He was last seen for hypertension 6 months ago.  BP at that visit was 150/91. Management since that visit includes continue with change in dose of amlodipine and losartan.  He reports excellent compliance with treatment. He is not having side effects. He is following a Regular diet. He is not exercising. He does smoke.  Use of agents associated with hypertension: none.   Outside blood pressures are: does not take  Symptoms: No chest pain No chest pressure  No palpitations No syncope  No dyspnea No orthopnea  No paroxysmal nocturnal dyspnea No lower extremity edema   Pertinent labs Lab Results  Component Value Date   CHOL 160 09/15/2021   HDL 38 (L) 09/15/2021   LDLCALC 97 09/15/2021   TRIG 139 09/15/2021   CHOLHDL 4.2 09/15/2021   Lab Results  Component Value Date   NA 140 09/15/2021   K 4.7 09/15/2021   CREATININE 1.12 09/15/2021   EGFR 78 09/15/2021   GLUCOSE 101 (H) 09/15/2021   TSH 2.220 09/15/2021     The ASCVD Risk score (Arnett DK, et al., 2019) failed to calculate for the following reasons:   The patient has a prior MI or stroke  diagnosis  ---------------------------------------------------------------------------------------------------   Medications: Outpatient Medications Prior to Visit  Medication Sig   albuterol (VENTOLIN HFA) 108 (90 Base) MCG/ACT inhaler TAKE 2 PUFFS BY MOUTH EVERY 6 HOURS AS NEEDED FOR WHEEZE OR SHORTNESS OF BREATH   amLODipine (NORVASC) 5 MG tablet Take 1 tablet (5 mg total) by mouth every evening.   aspirin EC 81 MG EC tablet Take 1 tablet (81 mg total) by mouth daily. Swallow whole.   atorvastatin (LIPITOR) 40 MG tablet TAKE 1 TABLET BY MOUTH EVERY DAY   B Complex Vitamins (VITAMIN B COMPLEX PO) Take 1 tablet by mouth daily as needed.   carvedilol (COREG) 12.5 MG tablet Take 1 tablet (12.5 mg total) by mouth 2 (two) times daily with a meal.   Cholecalciferol (D3-1000) 25 MCG (1000 UT) tablet Take 1 tablet (1,000 Units total) by mouth daily.   clopidogrel (PLAVIX) 75 MG tablet Take 1 tablet (75 mg total) by mouth daily.   doxycycline (MONODOX) 100 MG capsule Take 1 capsule (100 mg total) by mouth 2 (two) times daily.   fluticasone (FLONASE) 50 MCG/ACT nasal spray Place 1 spray into both nostrils 2 (two) times daily.   ipratropium-albuterol (DUONEB) 0.5-2.5 (3) MG/3ML SOLN INHALE 3 MLS INTO THE LUNGS 4 (FOUR) TIMES DAILY AS NEEDED.   losartan (COZAAR) 50 MG tablet Take 50 mg by mouth 2 (two) times daily.   naproxen (NAPROSYN) 500 MG tablet Take 1 tablet (500 mg total) by mouth 2 (two) times  daily with a meal.   orphenadrine (NORFLEX) 100 MG tablet Take 1 tablet (100 mg total) by mouth 2 (two) times daily.   Spacer/Aero-Holding Chambers (OPTICHAMBER DIAMOND-SM MASK) MISC Use with inhalers.   TRELEGY ELLIPTA 100-62.5-25 MCG/INH AEPB INHALE 1 PUFF BY MOUTH EVERY DAY   No facility-administered medications prior to visit.    Review of Systems  Constitutional:  Negative for appetite change, chills and fever.  Respiratory:  Negative for chest tightness, shortness of breath and wheezing.    Cardiovascular:  Negative for chest pain and palpitations.  Gastrointestinal:  Negative for abdominal pain, nausea and vomiting.    {Labs  Heme  Chem  Endocrine  Serology  Results Review (optional):23779}   Objective    {Show previous vital signs (optional):23777} Vitals:   03/26/22 1424 03/26/22 1426  BP: (!) 156/94 (!) 140/96  Pulse: 90   Resp: 16   Temp: (!) 97.3 F (36.3 C)   SpO2: 95%     Physical Exam   General appearance: Mildly obese male, cooperative and in no acute distress Head: Normocephalic, without obvious abnormality, atraumatic Respiratory: Respirations even and unlabored, normal respiratory rate Extremities: All extremities are intact.  Skin: Skin color, texture, turgor normal. No rashes seen  Psych: Appropriate mood and affect. Neurologic: Mental status: Alert, oriented to person, place, and time, thought content appropriate.    Assessment & Plan     1. Primary hypertension Uncontrolled. Change losartan to valsartan (DIOVAN) 160 MG tablet; Take 1 tablet (160 mg total) by mouth daily.  Dispense: 30 tablet; Refill: 1   Follow up 1 month to check BP and labs.      The entirety of the information documented in the History of Present Illness, Review of Systems and Physical Exam were personally obtained by me. Portions of this information were initially documented by the CMA and reviewed by me for thoroughness and accuracy.     Lelon Huh, MD  [facility] [phone] (phone) [phone] (fax)  [place]

## 2022-04-12 IMAGING — DX DG HUMERUS 2V *L*
2 series · 2 of 2 positions shown · non-contrast
Comparison: None.

CLINICAL DATA: Blunt trauma to left arm

EXAM:
LEFT HUMERUS - 2+ VIEW

[humerus ap]
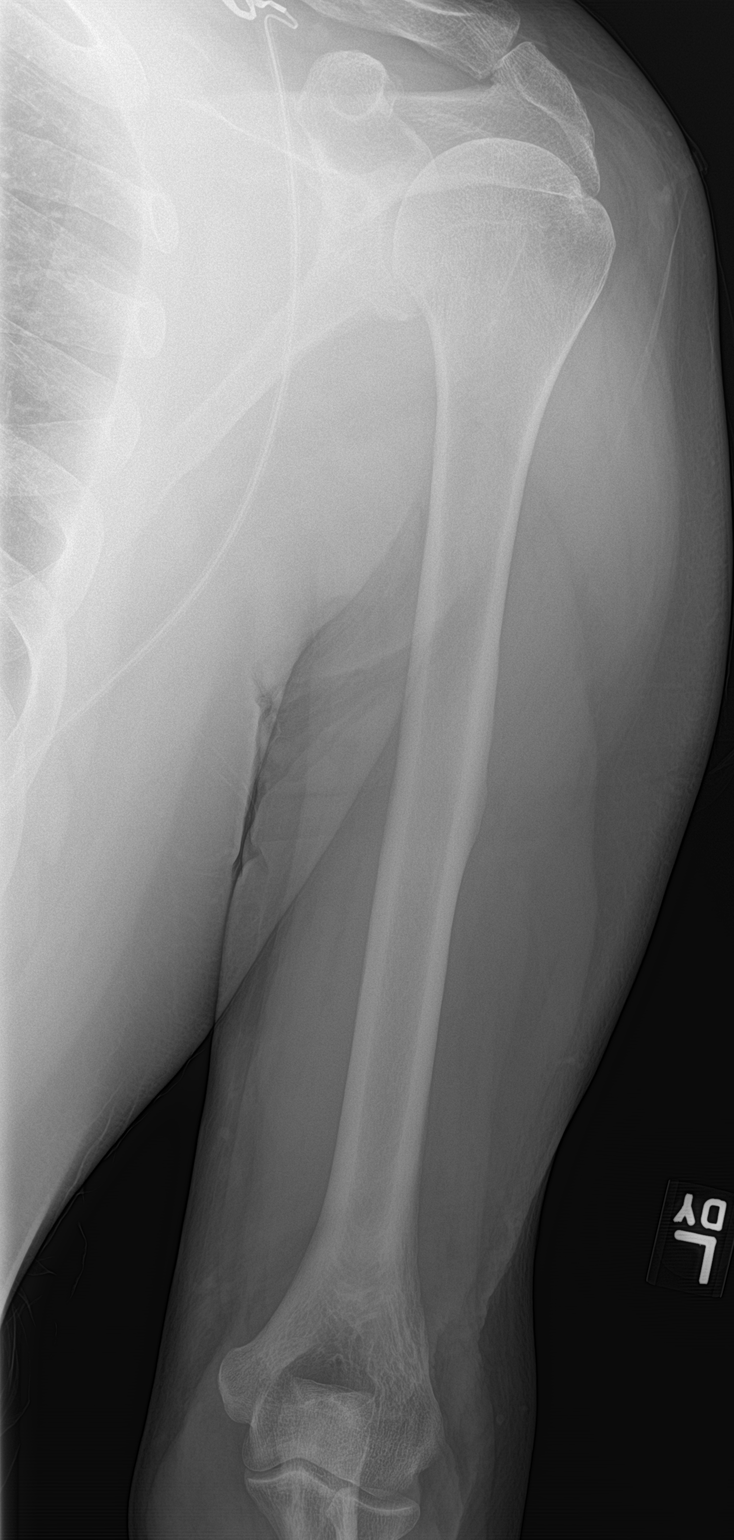

[humerus lat]
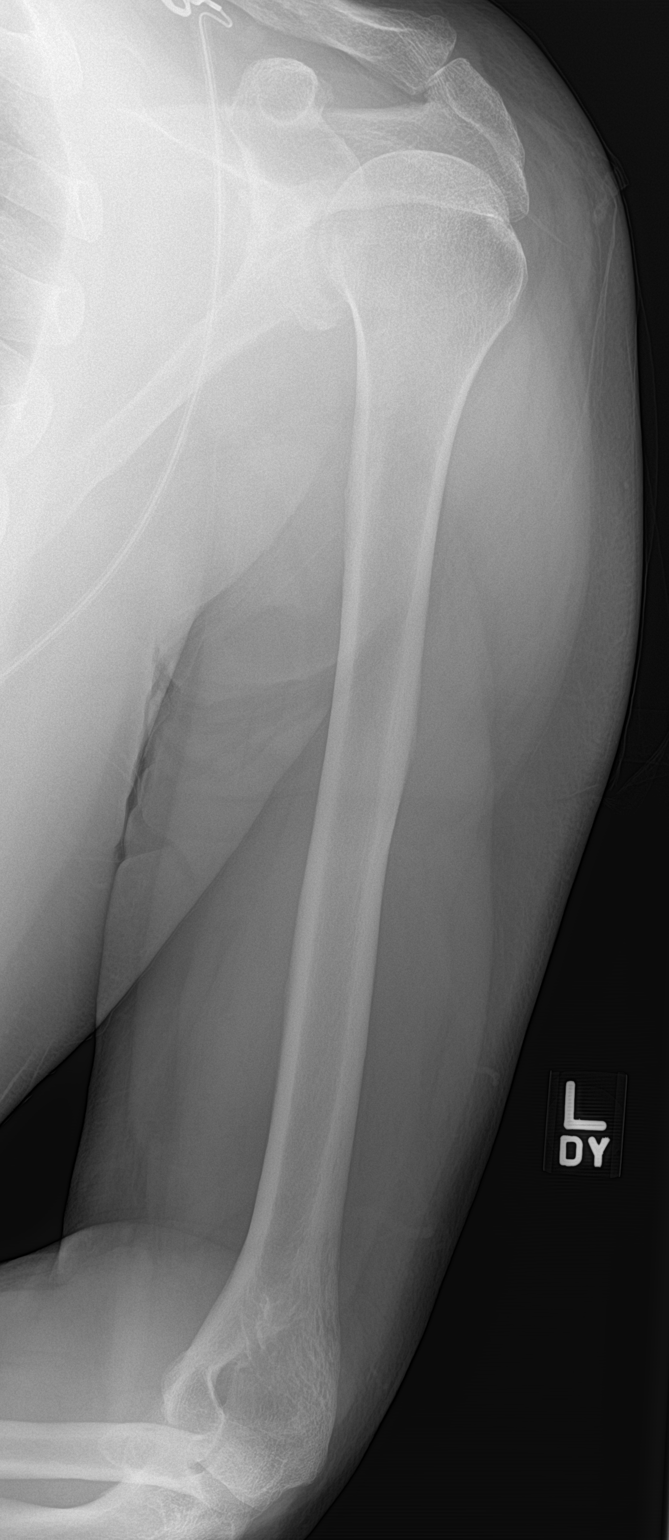

[2 of 2 positions shown; findings below may reference images not displayed]

FINDINGS: There is no evidence of fracture or other focal bone lesions. Soft
tissues are unremarkable.
IMPRESSION: Negative.

## 2022-04-23 ENCOUNTER — Ambulatory Visit (INDEPENDENT_AMBULATORY_CARE_PROVIDER_SITE_OTHER): Payer: 59 | Admitting: Family Medicine

## 2022-04-23 ENCOUNTER — Encounter: Payer: Self-pay | Admitting: Family Medicine

## 2022-04-23 VITALS — BP 144/110 | HR 77 | Temp 98.0°F | Resp 16 | Wt 225.0 lb

## 2022-04-23 DIAGNOSIS — I10 Essential (primary) hypertension: Secondary | ICD-10-CM | POA: Diagnosis not present

## 2022-04-23 NOTE — Progress Notes (Signed)
I,Roshena L Chambers,acting as a scribe for Lelon Huh, MD.,have documented all relevant documentation on the behalf of Lelon Huh, MD,as directed by  Lelon Huh, MD while in the presence of Lelon Huh, MD.   Established patient visit   Patient: Jon Yang   DOB: 01-Jan-1966   56 y.o. Male  MRN: [id number] Visit Date: 04/23/2022  Today's healthcare provider: Lelon Huh, MD   Chief Complaint  Patient presents with   Hypertension   Subjective    HPI  Hypertension, follow-up  BP Readings from Last 3 Encounters:  04/23/22 (!) 144/110  03/26/22 (!) 140/96  09/23/21 (!) 155/101   Wt Readings from Last 3 Encounters:  04/23/22 225 lb (102.1 kg)  03/26/22 231 lb 3.2 oz (104.9 kg)  09/23/21 225 lb (102.1 kg)     He was last seen for hypertension on 03/26/2022.   BP at that visit was 140/96. Management since that visit includes change losartan to valsartan (DIOVAN) 160 MG tablet; Take 1 tablet (160 mg total) by mouth daily.  He reports fair compliance with treatment. Patient has missed a couple doses of medication each week, including today's dose.  He is not having side effects.  He is following a Regular diet. He is not exercising. He does smoke.  Use of agents associated with hypertension: NSAIDS.   Outside blood pressures are not checked. Symptoms: No chest pain No chest pressure  No palpitations No syncope  No dyspnea No orthopnea  No paroxysmal nocturnal dyspnea No lower extremity edema   Pertinent labs Lab Results  Component Value Date   CHOL 160 09/15/2021   HDL 38 (L) 09/15/2021   LDLCALC 97 09/15/2021   TRIG 139 09/15/2021   CHOLHDL 4.2 09/15/2021   Lab Results  Component Value Date   NA 140 09/15/2021   K 4.7 09/15/2021   CREATININE 1.12 09/15/2021   EGFR 78 09/15/2021   GLUCOSE 101 (H) 09/15/2021   TSH 2.220 09/15/2021     The ASCVD Risk score (Arnett DK, et al., 2019) failed to calculate for the following reasons:   The patient  has a prior MI or stroke diagnosis  ---------------------------------------------------------------------------------------------------   Medications: Outpatient Medications Prior to Visit  Medication Sig   albuterol (VENTOLIN HFA) 108 (90 Base) MCG/ACT inhaler TAKE 2 PUFFS BY MOUTH EVERY 6 HOURS AS NEEDED FOR WHEEZE OR SHORTNESS OF BREATH   amLODipine (NORVASC) 5 MG tablet Take 1 tablet (5 mg total) by mouth every evening.   aspirin EC 81 MG EC tablet Take 1 tablet (81 mg total) by mouth daily. Swallow whole.   atorvastatin (LIPITOR) 40 MG tablet TAKE 1 TABLET BY MOUTH EVERY DAY   B Complex Vitamins (VITAMIN B COMPLEX PO) Take 1 tablet by mouth daily as needed.   carvedilol (COREG) 12.5 MG tablet Take 1 tablet (12.5 mg total) by mouth 2 (two) times daily with a meal.   Cholecalciferol (D3-1000) 25 MCG (1000 UT) tablet Take 1 tablet (1,000 Units total) by mouth daily.   clopidogrel (PLAVIX) 75 MG tablet Take 1 tablet (75 mg total) by mouth daily.   fluticasone (FLONASE) 50 MCG/ACT nasal spray Place 1 spray into both nostrils 2 (two) times daily.   ipratropium-albuterol (DUONEB) 0.5-2.5 (3) MG/3ML SOLN INHALE 3 MLS INTO THE LUNGS 4 (FOUR) TIMES DAILY AS NEEDED.   naproxen (NAPROSYN) 500 MG tablet Take 1 tablet (500 mg total) by mouth 2 (two) times daily with a meal.   Spacer/Aero-Holding Chambers (OPTICHAMBER DIAMOND-SM MASK) MISC  Use with inhalers.   TRELEGY ELLIPTA 100-62.5-25 MCG/INH AEPB INHALE 1 PUFF BY MOUTH EVERY DAY   valsartan (DIOVAN) 160 MG tablet Take 1 tablet (160 mg total) by mouth daily.   [DISCONTINUED] doxycycline (MONODOX) 100 MG capsule Take 1 capsule (100 mg total) by mouth 2 (two) times daily. (Patient not taking: Reported on 04/23/2022)   [DISCONTINUED] orphenadrine (NORFLEX) 100 MG tablet Take 1 tablet (100 mg total) by mouth 2 (two) times daily. (Patient not taking: Reported on 04/23/2022)   No facility-administered medications prior to visit.    Review of Systems   Constitutional:  Negative for appetite change, chills and fever.  Respiratory:  Negative for chest tightness, shortness of breath and wheezing.   Cardiovascular:  Negative for chest pain and palpitations.  Gastrointestinal:  Negative for abdominal pain, nausea and vomiting.       Objective    BP (!) 144/110 (BP Location: Right Arm, Patient Position: Sitting, Cuff Size: Large)   Pulse 77   Temp 98 F (36.7 C)   Resp 16   Wt 225 lb (102.1 kg)   SpO2 95% Comment: room air  BMI 32.28 kg/m    Today's Vitals   04/23/22 1544 04/23/22 1551  BP: (!) 155/100 (!) 144/110  Pulse: 76 77  Resp: 16   Temp: 98 F (36.7 C)   SpO2: 95%   Weight: 225 lb (102.1 kg)    Body mass index is 32.28 kg/m.     Assessment & Plan     1. Primary hypertension Tolerating change from losartan to valsartan. He did miss today's dose and has had recent dental work which he thinks is making his blood pressure go up.   - Renal function panel   Anticipate increasing dose of valsartan or adding hctz after reviewing labs.       The entirety of the information documented in the History of Present Illness, Review of Systems and Physical Exam were personally obtained by me. Portions of this information were initially documented by the CMA and reviewed by me for thoroughness and accuracy.     Lelon Huh, MD  [facility] [phone] (phone) [phone] (fax)  [place]

## 2022-04-24 LAB — RENAL FUNCTION PANEL
Albumin: 4.3 g/dL (ref 3.8–4.9)
BUN/Creatinine Ratio: 12 (ref 9–20)
BUN: 16 mg/dL (ref 6–24)
CO2: 25 mmol/L (ref 20–29)
Calcium: 9.8 mg/dL (ref 8.7–10.2)
Chloride: 102 mmol/L (ref 96–106)
Creatinine, Ser: 1.29 mg/dL — ABNORMAL HIGH (ref 0.76–1.27)
Glucose: 82 mg/dL (ref 70–99)
Phosphorus: 4 mg/dL (ref 2.8–4.1)
Potassium: 5 mmol/L (ref 3.5–5.2)
Sodium: 141 mmol/L (ref 134–144)
eGFR: 65 mL/min/{1.73_m2} (ref >59)

## 2022-04-27 ENCOUNTER — Other Ambulatory Visit: Payer: Self-pay | Admitting: Family Medicine

## 2022-04-27 DIAGNOSIS — I10 Essential (primary) hypertension: Secondary | ICD-10-CM

## 2022-04-27 MED ORDER — VALSARTAN-HYDROCHLOROTHIAZIDE 160-12.5 MG PO TABS: 1.0000 | ORAL_TABLET | Freq: Every day | ORAL | 1 refills | Status: DC

## 2022-05-11 ENCOUNTER — Other Ambulatory Visit: Payer: Self-pay | Admitting: Family Medicine

## 2022-05-11 DIAGNOSIS — I10 Essential (primary) hypertension: Secondary | ICD-10-CM

## 2022-06-04 ENCOUNTER — Ambulatory Visit: Payer: 59 | Admitting: Family Medicine

## 2022-06-04 NOTE — Progress Notes (Deleted)
Established patient visit   Patient: Jon Yang   DOB: January 31, 1966   56 y.o. Male  MRN: [id number] Visit Date: 06/04/2022  Today's healthcare provider: Lelon Huh, MD   No chief complaint on file.  Subjective    HPI  Hypertension, follow-up  BP Readings from Last 3 Encounters:  04/23/22 (!) 144/110  03/26/22 (!) 140/96  09/23/21 (!) 155/101   Wt Readings from Last 3 Encounters:  04/23/22 225 lb (102.1 kg)  03/26/22 231 lb 3.2 oz (104.9 kg)  09/23/21 225 lb (102.1 kg)     He was last seen for hypertension on 04/23/2022.   BP at that visit was 144/110. Management since that visit includes changing valsartan to valsartan-hctz for better blood pressure control.  He reports {excellent/good/fair/poor:19665} compliance with treatment. He {is/is not:9024} having side effects. {document side effects if present:1} He is following a {diet:21022986} diet. He {is/is not:9024} exercising. He {does/does not:200015} smoke.  Use of agents associated with hypertension: {bp agents assoc with hypertension:511::"none"}.   Outside blood pressures are {***enter patient reported home BP readings, or 'not being checked':1}. Symptoms: {Yes/No:20286} chest pain {Yes/No:20286} chest pressure  {Yes/No:20286} palpitations {Yes/No:20286} syncope  {Yes/No:20286} dyspnea {Yes/No:20286} orthopnea  {Yes/No:20286} paroxysmal nocturnal dyspnea {Yes/No:20286} lower extremity edema   Pertinent labs Lab Results  Component Value Date   CHOL 160 09/15/2021   HDL 38 (L) 09/15/2021   LDLCALC 97 09/15/2021   TRIG 139 09/15/2021   CHOLHDL 4.2 09/15/2021   Lab Results  Component Value Date   NA 141 04/23/2022   K 5.0 04/23/2022   CREATININE 1.29 (H) 04/23/2022   EGFR 65 04/23/2022   GLUCOSE 82 04/23/2022   TSH 2.220 09/15/2021     The ASCVD Risk score (Arnett DK, et al., 2019) failed to calculate for the following reasons:   The patient has a prior MI or stroke  diagnosis  ---------------------------------------------------------------------------------------------------   Medications: Outpatient Medications Prior to Visit  Medication Sig   albuterol (VENTOLIN HFA) 108 (90 Base) MCG/ACT inhaler TAKE 2 PUFFS BY MOUTH EVERY 6 HOURS AS NEEDED FOR WHEEZE OR SHORTNESS OF BREATH   amLODipine (NORVASC) 5 MG tablet Take 1 tablet (5 mg total) by mouth every evening.   aspirin EC 81 MG EC tablet Take 1 tablet (81 mg total) by mouth daily. Swallow whole.   atorvastatin (LIPITOR) 40 MG tablet TAKE 1 TABLET BY MOUTH EVERY DAY   B Complex Vitamins (VITAMIN B COMPLEX PO) Take 1 tablet by mouth daily as needed.   carvedilol (COREG) 12.5 MG tablet Take 1 tablet (12.5 mg total) by mouth 2 (two) times daily with a meal.   Cholecalciferol (D3-1000) 25 MCG (1000 UT) tablet Take 1 tablet (1,000 Units total) by mouth daily.   clopidogrel (PLAVIX) 75 MG tablet Take 1 tablet (75 mg total) by mouth daily.   fluticasone (FLONASE) 50 MCG/ACT nasal spray Place 1 spray into both nostrils 2 (two) times daily.   ipratropium-albuterol (DUONEB) 0.5-2.5 (3) MG/3ML SOLN INHALE 3 MLS INTO THE LUNGS 4 (FOUR) TIMES DAILY AS NEEDED.   naproxen (NAPROSYN) 500 MG tablet Take 1 tablet (500 mg total) by mouth 2 (two) times daily with a meal.   Spacer/Aero-Holding Latricia Cerrito (OPTICHAMBER DIAMOND-SM MASK) MISC Use with inhalers.   TRELEGY ELLIPTA 100-62.5-25 MCG/INH AEPB INHALE 1 PUFF BY MOUTH EVERY DAY   valsartan-hydrochlorothiazide (DIOVAN-HCT) 160-12.5 MG tablet TAKE 1 TABLET BY MOUTH EVERY DAY   No facility-administered medications prior to visit.    Review of Systems  {  Labs  Heme  Chem  Endocrine  Serology  Results Review (optional):23779}   Objective    There were no vitals taken for this visit. {Show previous vital signs (optional):23777}  Physical Exam  ***  No results found for any visits on 06/04/22.  Assessment & Plan     ***  No follow-ups on file.       {provider attestation***:1}   Lelon Huh, MD  [facility] [phone] (phone) [phone] (fax)  [place]

## 2022-06-11 ENCOUNTER — Other Ambulatory Visit: Payer: Self-pay | Admitting: Family Medicine

## 2022-06-11 DIAGNOSIS — I639 Cerebral infarction, unspecified: Secondary | ICD-10-CM

## 2022-06-11 DIAGNOSIS — I10 Essential (primary) hypertension: Secondary | ICD-10-CM

## 2022-06-21 ENCOUNTER — Encounter: Payer: Self-pay | Admitting: Family Medicine

## 2022-06-21 ENCOUNTER — Telehealth: Payer: Self-pay | Admitting: *Deleted

## 2022-06-21 DIAGNOSIS — J449 Chronic obstructive pulmonary disease, unspecified: Secondary | ICD-10-CM

## 2022-06-21 MED ORDER — TRELEGY ELLIPTA 100-62.5-25 MCG/ACT IN AEPB: 1.0000 | INHALATION_SPRAY | Freq: Every day | RESPIRATORY_TRACT | 11 refills | Status: DC

## 2022-06-21 NOTE — Telephone Encounter (Signed)
Please advise refill for Trelegy? Per epic, medication is no longer available.

## 2022-08-03 ENCOUNTER — Ambulatory Visit: Payer: Self-pay | Admitting: Physician Assistant

## 2022-08-03 ENCOUNTER — Encounter: Payer: Self-pay | Admitting: Physician Assistant

## 2022-08-03 DIAGNOSIS — Z1152 Encounter for screening for COVID-19: Secondary | ICD-10-CM

## 2022-08-03 LAB — POCT INFLUENZA A/B
Influenza A, POC: NEGATIVE
Influenza B, POC: NEGATIVE

## 2022-08-03 LAB — POC COVID19 BINAXNOW: SARS Coronavirus 2 Ag: NEGATIVE

## 2022-08-03 MED ORDER — PROMETHAZINE-DM 6.25-15 MG/5ML PO SYRP: 5.0000 mL | ORAL_SOLUTION | Freq: Four times a day (QID) | ORAL | 0 refills | Status: DC | PRN

## 2022-08-03 MED ORDER — AZITHROMYCIN 250 MG PO TABS: ORAL_TABLET | ORAL | 0 refills | Status: AC

## 2022-08-03 NOTE — Progress Notes (Signed)
Pt presents today with chest congestion, runny nose cough w/ green yellow mucus,no fever, last night pt believes he broke a fever and woke up with his hair and clothes were wet.

## 2022-08-03 NOTE — Progress Notes (Signed)
   Subjective: Cough and congestion    Patient ID: Jon Yang, male    DOB: [birth year], 57 y.o.   MRN: [id number]  HPI Patient complain of productive cough with nasal congestion and postnasal drainage.  States sputum is greenish-yellow in color..  Patient also complaining of fever and bodyaches.  Patient tested negative for COVID-19 and influenza today.   Review of Systems COPD, hyperlipidemia, and hypertension.    Objective:   Physical Exam  Deferred secondary to telephonic encounter.     Assessment & Plan: Respiratory infection   Patient given a prescription for Phenergan DM and a Z-Pak.  Patient advised to follow-up with worst complaint in next 48 hours.

## 2022-10-04 ENCOUNTER — Other Ambulatory Visit: Payer: Self-pay | Admitting: Physician Assistant

## 2022-10-04 ENCOUNTER — Other Ambulatory Visit: Payer: Self-pay

## 2022-10-04 DIAGNOSIS — J0120 Acute ethmoidal sinusitis, unspecified: Secondary | ICD-10-CM

## 2022-10-04 DIAGNOSIS — R6883 Chills (without fever): Secondary | ICD-10-CM

## 2022-10-04 DIAGNOSIS — J441 Chronic obstructive pulmonary disease with (acute) exacerbation: Secondary | ICD-10-CM

## 2022-10-04 DIAGNOSIS — R051 Acute cough: Secondary | ICD-10-CM

## 2022-10-04 LAB — POCT INFLUENZA A/B
Influenza A, POC: NEGATIVE
Influenza B, POC: NEGATIVE

## 2022-10-04 LAB — POC COVID19 BINAXNOW: SARS Coronavirus 2 Ag: NEGATIVE

## 2022-10-04 MED ORDER — BENZONATATE 100 MG PO CAPS: 100.0000 mg | ORAL_CAPSULE | Freq: Three times a day (TID) | ORAL | 0 refills | Status: DC | PRN

## 2022-10-04 MED ORDER — ALBUTEROL SULFATE HFA 108 (90 BASE) MCG/ACT IN AERS: INHALATION_SPRAY | RESPIRATORY_TRACT | 3 refills | Status: DC

## 2022-10-04 MED ORDER — IBUPROFEN 800 MG PO TABS: 800.0000 mg | ORAL_TABLET | Freq: Three times a day (TID) | ORAL | 0 refills | Status: DC | PRN

## 2022-10-04 MED ORDER — FEXOFENADINE-PSEUDOEPHED ER 60-120 MG PO TB12: 1.0000 | ORAL_TABLET | Freq: Two times a day (BID) | ORAL | 0 refills | Status: DC

## 2022-10-04 NOTE — Progress Notes (Signed)
   Subjective: Congestion and cough    Patient ID: Jon Yang, male    DOB: 09/14/1965, 57 y.o.   MRN: [id number]  HPI Patient complaining of nasal congestion, cough, and sore throat for 4 days.  Patient also states chills but unsure of fever.  Patient tested negative for COVID-19 and influenza today.   Review of Systems Negative except for above complaint    Objective:   Physical Exam Physical exam was deferred secondary to this being a telephonic encounter.       Assessment & Plan: Nasal congestion   Patient given a prescription for Allegra-D, Tessalon Perles, and ibuprofen.  Patient advised to follow-up in 2 to 3 days no improvement or worsening complaints.

## 2022-10-04 NOTE — Progress Notes (Signed)
Day 4 c/o feeling sick with congestion in sinuses with productive cough, chills at times with not checking temperature and extreme lethargy.  Stated using OTC cold meds and not sure of ingredients.  Tested for covid/flu which were negative and requested telehealth.

## 2022-10-06 ENCOUNTER — Other Ambulatory Visit: Payer: Self-pay

## 2022-10-06 MED ORDER — PROMETHAZINE-DM 6.25-15 MG/5ML PO SYRP: 5.0000 mL | ORAL_SOLUTION | Freq: Four times a day (QID) | ORAL | 0 refills | Status: DC | PRN

## 2022-11-08 ENCOUNTER — Ambulatory Visit: Payer: Self-pay | Admitting: Physician Assistant

## 2022-11-08 ENCOUNTER — Encounter: Payer: Self-pay | Admitting: Physician Assistant

## 2022-11-08 VITALS — BP 156/109 | HR 210 | Temp 97.8°F | Resp 12 | Ht 70.0 in | Wt 210.0 lb

## 2022-11-08 DIAGNOSIS — J301 Allergic rhinitis due to pollen: Secondary | ICD-10-CM

## 2022-11-08 MED ORDER — METHYLPREDNISOLONE 4 MG PO TBPK: ORAL_TABLET | ORAL | 0 refills | Status: DC

## 2022-11-08 MED ORDER — METHYLPREDNISOLONE SODIUM SUCC 40 MG IJ SOLR
40.0000 mg | Freq: Once | INTRAMUSCULAR | Status: AC
Start: 2022-11-08 — End: 2022-11-08
  Administered 2022-11-08: 40 mg via INTRAMUSCULAR

## 2022-11-08 MED ORDER — LEVOCETIRIZINE DIHYDROCHLORIDE 5 MG PO TABS: 5.0000 mg | ORAL_TABLET | Freq: Every evening | ORAL | 1 refills | Status: DC

## 2022-11-08 NOTE — Progress Notes (Signed)
   Subjective: Congestion    Patient ID: Jon Yang, male    DOB: 11/03/1965, 57 y.o.   MRN: [id number]  HPI Patient presents to clinic with c/o worsening congestion over the past month. States he has been experiencing itchy, watery eyes, runny nose, & congested cough. Has been taking Allegra-D & Claritin with no symptom relief.    Review of Systems Denies fever/chills. Reports not taking BP medication this morning.     Objective:   Physical Exam  VS:  BP 156/109  Pulse 210  Resp 12  Temp 97.8 F (36.6 C)  Temp src Temporal  Weight 210 lb (95.3 kg)  Height 5\' 10"  (1.778 m)   HEENT: erythematous bilateral ear canals, nasal & buccal mucous pink & moist Lungs: clear bilateral lung sounds       Assessment & Plan: Allergic Rhinitis   Patient experiencing severe seasonal allergies. Received solumedrol IM injection in clinic, Medrol dosepak, & switching from Claritin to Xyzal. Will f/u in 2 weeks.

## 2022-11-08 NOTE — Progress Notes (Signed)
continuous congestion 3weeks getting horse.

## 2022-11-30 ENCOUNTER — Other Ambulatory Visit: Payer: Self-pay | Admitting: Physician Assistant

## 2022-12-08 ENCOUNTER — Other Ambulatory Visit: Payer: Self-pay | Admitting: Physician Assistant

## 2022-12-08 DIAGNOSIS — J441 Chronic obstructive pulmonary disease with (acute) exacerbation: Secondary | ICD-10-CM

## 2022-12-10 DIAGNOSIS — J3089 Other allergic rhinitis: Secondary | ICD-10-CM | POA: Diagnosis not present

## 2022-12-10 DIAGNOSIS — F17200 Nicotine dependence, unspecified, uncomplicated: Secondary | ICD-10-CM | POA: Diagnosis not present

## 2022-12-10 DIAGNOSIS — H1045 Other chronic allergic conjunctivitis: Secondary | ICD-10-CM | POA: Diagnosis not present

## 2022-12-10 DIAGNOSIS — R052 Subacute cough: Secondary | ICD-10-CM | POA: Diagnosis not present

## 2022-12-24 ENCOUNTER — Ambulatory Visit: Payer: Self-pay

## 2022-12-24 DIAGNOSIS — Z0000 Encounter for general adult medical examination without abnormal findings: Secondary | ICD-10-CM

## 2022-12-24 LAB — POCT URINALYSIS DIPSTICK
Bilirubin, UA: NEGATIVE
Blood, UA: NEGATIVE
Glucose, UA: NEGATIVE
Ketones, UA: NEGATIVE
Leukocytes, UA: NEGATIVE
Nitrite, UA: NEGATIVE
Protein, UA: NEGATIVE
Spec Grav, UA: <=1.005 — AB (ref 1.010–1.025)
Urobilinogen, UA: 0.2 E.U./dL
pH, UA: 6 (ref 5.0–8.0)

## 2022-12-24 NOTE — Progress Notes (Signed)
Pt completed labs for physical. ?

## 2022-12-24 NOTE — Addendum Note (Signed)
Addended by: Christianne Dolin F on: 12/24/2022 02:46 PM   Modules accepted: Orders

## 2022-12-25 LAB — CMP12+LP+TP+TSH+6AC+PSA+CBC…
ALT: 26 IU/L (ref 0–44)
AST: 26 IU/L (ref 0–40)
Albumin/Globulin Ratio: 1.4 (ref 1.2–2.2)
Albumin: 4.1 g/dL (ref 3.8–4.9)
Alkaline Phosphatase: 114 IU/L (ref 44–121)
BUN/Creatinine Ratio: 9 (ref 9–20)
BUN: 10 mg/dL (ref 6–24)
Basophils Absolute: 0.1 10*3/uL (ref 0.0–0.2)
Basos: 1 %
Bilirubin Total: 1.2 mg/dL (ref 0.0–1.2)
Calcium: 9.2 mg/dL (ref 8.7–10.2)
Chloride: 103 mmol/L (ref 96–106)
Chol/HDL Ratio: 7.1 ratio — ABNORMAL HIGH (ref 0.0–5.0)
Cholesterol, Total: 271 mg/dL — ABNORMAL HIGH (ref 100–199)
Creatinine, Ser: 1.06 mg/dL (ref 0.76–1.27)
EOS (ABSOLUTE): 0.1 10*3/uL (ref 0.0–0.4)
Eos: 1 %
Estimated CHD Risk: 1.5 times avg. — ABNORMAL HIGH (ref 0.0–1.0)
Free Thyroxine Index: 2 (ref 1.2–4.9)
GGT: 53 IU/L (ref 0–65)
Globulin, Total: 2.9 g/dL (ref 1.5–4.5)
Glucose: 91 mg/dL (ref 70–99)
HDL: 38 mg/dL — ABNORMAL LOW (ref >39)
Hematocrit: 54.6 % — ABNORMAL HIGH (ref 37.5–51.0)
Hemoglobin: 18.5 g/dL — ABNORMAL HIGH (ref 13.0–17.7)
Immature Grans (Abs): 0 10*3/uL (ref 0.0–0.1)
Immature Granulocytes: 0 %
Iron: 101 ug/dL (ref 38–169)
LDH: 281 IU/L — ABNORMAL HIGH (ref 121–224)
LDL Chol Calc (NIH): 184 mg/dL — ABNORMAL HIGH (ref 0–99)
Lymphocytes Absolute: 2.4 10*3/uL (ref 0.7–3.1)
Lymphs: 24 %
MCH: 30.1 pg (ref 26.6–33.0)
MCHC: 33.9 g/dL (ref 31.5–35.7)
MCV: 89 fL (ref 79–97)
Monocytes Absolute: 0.9 10*3/uL (ref 0.1–0.9)
Monocytes: 8 %
Neutrophils Absolute: 6.6 10*3/uL (ref 1.4–7.0)
Neutrophils: 66 %
Phosphorus: 4.2 mg/dL — ABNORMAL HIGH (ref 2.8–4.1)
Platelets: 235 10*3/uL (ref 150–450)
Potassium: 4.2 mmol/L (ref 3.5–5.2)
Prostate Specific Ag, Serum: 2.6 ng/mL (ref 0.0–4.0)
RBC: 6.14 x10E6/uL — ABNORMAL HIGH (ref 4.14–5.80)
RDW: 13.1 % (ref 11.6–15.4)
Sodium: 139 mmol/L (ref 134–144)
T3 Uptake Ratio: 27 % (ref 24–39)
T4, Total: 7.5 ug/dL (ref 4.5–12.0)
TSH: 2.18 u[IU]/mL (ref 0.450–4.500)
Total Protein: 7 g/dL (ref 6.0–8.5)
Triglycerides: 252 mg/dL — ABNORMAL HIGH (ref 0–149)
Uric Acid: 7.2 mg/dL (ref 3.8–8.4)
VLDL Cholesterol Cal: 49 mg/dL — ABNORMAL HIGH (ref 5–40)
WBC: 10.1 10*3/uL (ref 3.4–10.8)
eGFR: 82 mL/min/{1.73_m2} (ref >59)

## 2022-12-28 ENCOUNTER — Ambulatory Visit: Payer: 59 | Admitting: Physician Assistant

## 2023-01-03 ENCOUNTER — Ambulatory Visit: Payer: Self-pay | Admitting: Physician Assistant

## 2023-01-03 VITALS — BP 165/112 | HR 77 | Temp 98.2°F | Resp 16 | Ht 69.0 in | Wt 216.0 lb

## 2023-01-03 DIAGNOSIS — I10 Essential (primary) hypertension: Secondary | ICD-10-CM

## 2023-01-03 DIAGNOSIS — Z760 Encounter for issue of repeat prescription: Secondary | ICD-10-CM

## 2023-01-03 DIAGNOSIS — E785 Hyperlipidemia, unspecified: Secondary | ICD-10-CM

## 2023-01-03 DIAGNOSIS — J449 Chronic obstructive pulmonary disease, unspecified: Secondary | ICD-10-CM

## 2023-01-03 DIAGNOSIS — Z0000 Encounter for general adult medical examination without abnormal findings: Secondary | ICD-10-CM

## 2023-01-03 MED ORDER — ALBUTEROL SULFATE HFA 108 (90 BASE) MCG/ACT IN AERS
2.0000 | INHALATION_SPRAY | Freq: Four times a day (QID) | RESPIRATORY_TRACT | 1 refills | Status: DC | PRN
Start: 2023-01-03 — End: 2023-01-04

## 2023-01-03 NOTE — Progress Notes (Signed)
Pt presents today to complete physical, Pt denies any issues or concerns at this time/CL,RMA 

## 2023-01-03 NOTE — Progress Notes (Signed)
[facility] [address] [place], [place] [address]   Office Visit Note  Patient Name: Jon Yang Date of Birth [id number]  Medical Record number [id number]  Date of Service: 01/03/2023  Chief Complaint  Patient presents with   Annual Exam     58 y/o M presents to the clinic for his annual physical. Pt is doing well. NO concerns at the present time. Does request for albuterol inhaler Rx refill.       Current Medication:  Outpatient Encounter Medications as of 01/03/2023  Medication Sig   albuterol (VENTOLIN HFA) 108 (90 Base) MCG/ACT inhaler TAKE 2 PUFFS BY MOUTH EVERY 6 HOURS AS NEEDED FOR WHEEZE OR SHORTNESS OF BREATH   albuterol (VENTOLIN HFA) 108 (90 Base) MCG/ACT inhaler Inhale 2 puffs into the lungs every 6 (six) hours as needed for wheezing or shortness of breath.   amLODipine (NORVASC) 5 MG tablet TAKE 1 TABLET BY MOUTH EVERY DAY IN THE EVENING   aspirin EC 81 MG EC tablet Take 1 tablet (81 mg total) by mouth daily. Swallow whole.   atorvastatin (LIPITOR) 40 MG tablet TAKE 1 TABLET BY MOUTH EVERY DAY   azelastine (OPTIVAR) 0.05 % ophthalmic solution Place 1 drop into the right eye.   Azelastine HCl 137 MCG/SPRAY SOLN Place into both nostrils.   B Complex Vitamins (VITAMIN B COMPLEX PO) Take 1 tablet by mouth daily as needed.   carvedilol (COREG) 12.5 MG tablet Take 1 tablet (12.5 mg total) by mouth 2 (two) times daily with a meal.   Cholecalciferol (D3-1000) 25 MCG (1000 UT) tablet Take 1 tablet (1,000 Units total) by mouth daily.   clopidogrel (PLAVIX) 75 MG tablet TAKE 1 TABLET BY MOUTH EVERY DAY   fexofenadine-pseudoephedrine (ALLEGRA-D) 60-120 MG 12 hr tablet Take 1 tablet by mouth 2 (two) times daily.   fluticasone (FLONASE) 50 MCG/ACT nasal spray Place 1 spray into both nostrils 2 (two) times daily.   ibuprofen (ADVIL) 800 MG tablet Take 1 tablet (800 mg total) by mouth every 8 (eight) hours as needed for moderate pain.   ipratropium-albuterol (DUONEB)  0.5-2.5 (3) MG/3ML SOLN INHALE 3 MLS INTO THE LUNGS 4 (FOUR) TIMES DAILY AS NEEDED.   levocetirizine (XYZAL ALLERGY 24HR) 5 MG tablet Take 1 tablet (5 mg total) by mouth every evening.   methylPREDNISolone (MEDROL DOSEPAK) 4 MG TBPK tablet Take Tapered dose as directed   montelukast (SINGULAIR) 10 MG tablet Take 10 mg by mouth daily.   naproxen (NAPROSYN) 500 MG tablet Take 1 tablet (500 mg total) by mouth 2 (two) times daily with a meal.   promethazine-dextromethorphan (PROMETHAZINE-DM) 6.25-15 MG/5ML syrup Take 5 mLs by mouth 4 (four) times daily as needed for cough.   Spacer/Aero-Holding Chambers (OPTICHAMBER DIAMOND-SM MASK) MISC Use with inhalers.   TRELEGY ELLIPTA 200-62.5-25 MCG/ACT AEPB Inhale 1 puff into the lungs daily.   valsartan-hydrochlorothiazide (DIOVAN-HCT) 160-12.5 MG tablet TAKE 1 TABLET BY MOUTH EVERY DAY   [DISCONTINUED] Fluticasone-Umeclidin-Vilant (TRELEGY ELLIPTA) 100-62.5-25 MCG/ACT AEPB Inhale 1 puff into the lungs daily.   [DISCONTINUED] benzonatate (TESSALON PERLES) 100 MG capsule Take 1 capsule (100 mg total) by mouth 3 (three) times daily as needed for cough. (Patient not taking: Reported on 01/03/2023)   No facility-administered encounter medications on file as of 01/03/2023.      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   HENT: Negative.    Respiratory: Negative.    Cardiovascular: Negative.   Gastrointestinal: Negative.   Endocrine: Negative.   Genitourinary: Negative.   Musculoskeletal: Negative.   Neurological: Negative.    Psychiatric/Behavioral: Negative.      Physical Exam Constitutional:      Appearance: Normal appearance. He is obese.  HENT:     Head: Normocephalic and atraumatic.     Right Ear: Tympanic membrane, ear canal and external ear normal.     Left Ear: Tympanic membrane, ear canal and external ear normal.     Nose: Nose normal.     Mouth/Throat:     Mouth: Mucous membranes are moist.     Pharynx: Oropharynx is clear.  Eyes:     Extraocular Movements: Extraocular movements intact.     Pupils: Pupils are equal, round, and reactive to light.  Cardiovascular:     Rate and Rhythm: Normal rate and regular rhythm.  Pulmonary:     Effort: Pulmonary effort is normal.     Breath sounds: Normal breath sounds.  Abdominal:     General: Bowel sounds are normal.     Palpations: Abdomen is soft.     Tenderness: There is no abdominal tenderness.  Musculoskeletal:        General: Normal range of motion.     Cervical back: Normal range of motion and neck supple.  Skin:    General: Skin is warm and dry.  Neurological:     General: No focal deficit present.     Mental Status: He is alert and oriented to person, place, and time.  Psychiatric:        Mood and Affect: Mood normal.        Behavior: Behavior normal.        Thought Content: Thought content normal.       Assessment/Plan:  1. Routine adult health maintenance  2. Elevated blood pressure reading in office with diagnosis of hypertension  3. Hyperlipidemia, unspecified hyperlipidemia type  4. Medication refill - albuterol (VENTOLIN HFA) 108 (90 Base) MCG/ACT inhaler; Inhale 2 puffs into the lungs every 6 (six) hours as needed for wheezing or shortness of breath.  Dispense: 8 g; Refill: 1  5. Chronic obstructive pulmonary disease, unspecified COPD type (HCC) - albuterol (VENTOLIN HFA) 108 (90 Base) MCG/ACT inhaler; Inhale 2 puffs into the lungs every 6 (six) hours as needed for wheezing or shortness of breath.  Dispense: 8 g;  Refill: 1  Discussed in length about abnormal cholesterol reading.  Increase fresh fruits and veggies.  Exercise daily Reduce saturated fats and trans fats in diet. RTC in 3 months for repeat cholesterol blood work. If repeat cholesterol blood work not improved then will need to increase dose of Lipitor to 80mg  daily or add Zetia medication. Pt requested a Rx for Albuterol inhaler General Counseling: Fredi verbalizes understanding of the findings of todays visit and agrees with plan of treatment. I have discussed any further diagnostic evaluation that may be needed or ordered today. We also reviewed his medications today. he has been encouraged to call the  office with any questions or concerns that should arise related to todays visit.    Time spent:30 Minutes    Gilberto Better, [place] Physician Assistant

## 2023-01-04 ENCOUNTER — Other Ambulatory Visit: Payer: Self-pay

## 2023-01-04 DIAGNOSIS — J449 Chronic obstructive pulmonary disease, unspecified: Secondary | ICD-10-CM

## 2023-01-04 MED ORDER — LEVALBUTEROL TARTRATE 45 MCG/ACT IN AERO
2.0000 | INHALATION_SPRAY | Freq: Four times a day (QID) | RESPIRATORY_TRACT | 12 refills | Status: DC | PRN
Start: 2023-01-04 — End: 2024-01-25

## 2023-01-05 ENCOUNTER — Ambulatory Visit: Payer: 59 | Admitting: Family

## 2023-01-05 ENCOUNTER — Encounter: Payer: Self-pay | Admitting: Family

## 2023-01-05 VITALS — BP 164/108 | HR 80 | Temp 98.6°F | Resp 16

## 2023-01-05 DIAGNOSIS — W458XXA Other foreign body or object entering through skin, initial encounter: Secondary | ICD-10-CM

## 2023-01-05 NOTE — Progress Notes (Signed)
   Subjective:    Patient ID: Jon Yang, male    DOB: October 11, 1965, 57 y.o.   MRN: [id number]  Was seen on 6/10 for well exam.  Today he came in for a splinter that has been in his hand for 2 weeks.      Review of Systems  Skin:        Splinter in palm of right hand.       Objective:   Physical Exam Skin:    Comments: Splinter in palm of right hand. Areas with inflamed, crust.  Entrance of splinter was visible. No drainage noted. No s/s infection.             Assessment & Plan:  Splinter removal: Right palm cleaned with Betadine swabs. Using scalpel area of wound entrance was opened and foreign object was removed.  The object looked like a thorn. Bactroban applied to area. Covered with dressing.    Patient tolerated procedure well.   Patient tolerated procedure well.

## 2023-01-24 DIAGNOSIS — H52203 Unspecified astigmatism, bilateral: Secondary | ICD-10-CM | POA: Diagnosis not present

## 2023-01-24 DIAGNOSIS — H53003 Unspecified amblyopia, bilateral: Secondary | ICD-10-CM | POA: Diagnosis not present

## 2023-03-08 ENCOUNTER — Encounter: Payer: Self-pay | Admitting: Family Medicine

## 2023-03-08 ENCOUNTER — Other Ambulatory Visit: Payer: Self-pay | Admitting: Family Medicine

## 2023-03-08 DIAGNOSIS — J449 Chronic obstructive pulmonary disease, unspecified: Secondary | ICD-10-CM

## 2023-03-08 NOTE — Telephone Encounter (Signed)
Medication Refill - Medication: TRELEGY ELLIPTA 200-62.5-25 MCG/ACT AEPB and ipratropium-albuterol (DUONEB) 0.5-2.5 (3) MG/3ML SOLN   Has the patient contacted their pharmacy? No.  Preferred Pharmacy (with phone number or street name):  [id number] [place], [place] - [address] Phone: [phone]  Fax: [phone]     Has the patient been seen for an appointment in the last year OR does the patient have an upcoming appointment? Yes.    Agent: Please be advised that RX refills may take up to 3 business days. We ask that you follow-up with your pharmacy.

## 2023-03-10 NOTE — Telephone Encounter (Signed)
Requested medications are due for refill today.  yes  Requested medications are on the active medications list.  yes  Last refill. Duoneb 04/13/2021, Trelegy 12/10/2022  Future visit scheduled.   no  Notes to clinic.  Trelegy is listed as historical. Duoneb was last refilled 03/2021. Please review for refill.     Requested Prescriptions  Pending Prescriptions Disp Refills   ipratropium-albuterol (DUONEB) 0.5-2.5 (3) MG/3ML SOLN 360 mL 1    Sig: Inhale 3 mLs into the lungs 4 (four) times daily as needed.     Pulmonology:  Combination Products - albuterol / ipratropium Failed - 03/08/2023  1:52 PM      Failed - Last BP in normal range    BP Readings from Last 1 Encounters:  01/05/23 (!) 164/108         Passed - Last Heart Rate in normal range    Pulse Readings from Last 1 Encounters:  01/05/23 80         Passed - Valid encounter within last 12 months    Recent Outpatient Visits           10 months ago Primary hypertension   [place] [facility] Malva Limes, [facility]   11 months ago Primary hypertension   [place] [facility] Malva Limes, [facility]   1 year ago Primary hypertension   [place]  [facility], [facility]   1 year ago Hyperlipidemia, unspecified hyperlipidemia type   [facility] Malva Limes, MD   1 year ago Primary hypertension   [facility] Malva Limes, MD               TRELEGY ELLIPTA 200-62.5-25 MCG/ACT AEPB      Sig: Inhale 1 puff into the lungs daily.     Off-Protocol Failed - 03/08/2023  1:52 PM      Failed - Medication not assigned to a protocol, review manually.      Passed - Valid encounter within last 12 months    Recent Outpatient Visits           10 months ago Primary hypertension   [facility] Malva Limes, MD   11 months ago Primary hypertension   [place] [facility] Malva Limes, MD   1 year ago Primary hypertension   [place] [facility] Malva Limes, MD   1 year ago Hyperlipidemia, unspecified hyperlipidemia type   [facility] Malva Limes, MD   1 year ago Primary hypertension    [facility] Malva Limes, MD

## 2023-03-11 ENCOUNTER — Encounter: Payer: Self-pay | Admitting: Pharmacist

## 2023-03-11 ENCOUNTER — Other Ambulatory Visit: Payer: Self-pay

## 2023-03-11 MED ORDER — TRELEGY ELLIPTA 200-62.5-25 MCG/ACT IN AEPB: 1.0000 | INHALATION_SPRAY | Freq: Every day | RESPIRATORY_TRACT | 3 refills | Status: DC

## 2023-03-11 MED ORDER — IPRATROPIUM-ALBUTEROL 0.5-2.5 (3) MG/3ML IN SOLN: 3.0000 mL | Freq: Four times a day (QID) | RESPIRATORY_TRACT | 1 refills | Status: DC | PRN

## 2023-03-11 MED ORDER — TRELEGY ELLIPTA 200-62.5-25 MCG/ACT IN AEPB
1.0000 | INHALATION_SPRAY | Freq: Every day | RESPIRATORY_TRACT | 1 refills | Status: DC
Start: 2023-03-11 — End: 2023-09-13
  Filled 2023-03-11: qty 60, 30d supply, fill #0

## 2023-03-16 ENCOUNTER — Other Ambulatory Visit: Payer: Self-pay

## 2023-05-09 ENCOUNTER — Encounter: Payer: Self-pay | Admitting: Family Medicine

## 2023-05-09 NOTE — Telephone Encounter (Signed)
He can have two sample boxes if we have them.

## 2023-05-10 NOTE — Telephone Encounter (Signed)
Pt called back and is coming to get samples.  I checked and they are ready for him at the front desk.

## 2023-05-10 NOTE — Telephone Encounter (Signed)
I think this is the wrong patient, he is not on Xarelto and it looks like he has transferred to another practice

## 2023-06-11 ENCOUNTER — Other Ambulatory Visit: Payer: Self-pay | Admitting: Family Medicine

## 2023-06-11 DIAGNOSIS — I10 Essential (primary) hypertension: Secondary | ICD-10-CM

## 2023-09-11 ENCOUNTER — Other Ambulatory Visit: Payer: Self-pay | Admitting: Family Medicine

## 2023-09-12 NOTE — Telephone Encounter (Signed)
Requested medication (s) are due for refill today: yes  Requested medication (s) are on the active medication list: yes  Last refill:  03/11/23  Future visit scheduled: yes  Notes to clinic:   Medication not assigned to a protocol, review manually.      Requested Prescriptions  Pending Prescriptions Disp Refills   TRELEGY ELLIPTA 200-62.5-25 MCG/ACT AEPB [Pharmacy Med Name: TRELEGY ELLIPTA 200-62.5-25] 60 each 3    Sig: TAKE 1 PUFF BY MOUTH EVERY DAY     Off-Protocol Failed - 09/12/2023  3:50 PM      Failed - Medication not assigned to a protocol, review manually.      Failed - Valid encounter within last 12 months    Recent Outpatient Visits           1 year ago Primary hypertension   [place] [facility] Malva Limes, MD   1 year ago Primary hypertension   [place] [facility] Malva Limes, MD   1 year ago Primary hypertension   [place] [facility] Malva Limes, MD   2 years ago Hyperlipidemia, unspecified hyperlipidemia type   [facility] Malva Limes, MD   2 years ago Primary hypertension   [facility] Malva Limes, MD

## 2023-10-19 ENCOUNTER — Encounter: Payer: Self-pay | Admitting: Family Medicine

## 2023-10-19 DIAGNOSIS — J449 Chronic obstructive pulmonary disease, unspecified: Secondary | ICD-10-CM

## 2023-10-19 DIAGNOSIS — Z760 Encounter for issue of repeat prescription: Secondary | ICD-10-CM

## 2023-10-21 MED ORDER — ALBUTEROL SULFATE HFA 108 (90 BASE) MCG/ACT IN AERS
2.0000 | INHALATION_SPRAY | Freq: Four times a day (QID) | RESPIRATORY_TRACT | 3 refills | Status: DC | PRN
Start: 2023-10-21 — End: 2024-01-23

## 2023-11-23 ENCOUNTER — Other Ambulatory Visit: Payer: Self-pay

## 2024-01-23 MED ORDER — ALBUTEROL SULFATE HFA 108 (90 BASE) MCG/ACT IN AERS: 2.0000 | INHALATION_SPRAY | Freq: Four times a day (QID) | RESPIRATORY_TRACT | 0 refills | Status: DC | PRN

## 2024-01-23 NOTE — Addendum Note (Signed)
 Addended by: GASPER NANCYANN BRAVO on: 01/23/2024 05:45 PM   Modules accepted: Orders

## 2024-01-25 ENCOUNTER — Ambulatory Visit (INDEPENDENT_AMBULATORY_CARE_PROVIDER_SITE_OTHER): Admitting: Family Medicine

## 2024-01-25 VITALS — BP 193/104 | HR 80 | Resp 20 | Ht 70.0 in | Wt 215.1 lb

## 2024-01-25 DIAGNOSIS — I10 Essential (primary) hypertension: Secondary | ICD-10-CM

## 2024-01-25 DIAGNOSIS — Z5320 Procedure and treatment not carried out because of patient's decision for unspecified reasons: Secondary | ICD-10-CM | POA: Diagnosis not present

## 2024-01-25 DIAGNOSIS — I69359 Hemiplegia and hemiparesis following cerebral infarction affecting unspecified side: Secondary | ICD-10-CM | POA: Diagnosis not present

## 2024-01-25 DIAGNOSIS — E785 Hyperlipidemia, unspecified: Secondary | ICD-10-CM | POA: Diagnosis not present

## 2024-01-25 DIAGNOSIS — Z91199 Patient's noncompliance with other medical treatment and regimen due to unspecified reason: Secondary | ICD-10-CM | POA: Diagnosis not present

## 2024-01-25 DIAGNOSIS — I160 Hypertensive urgency: Secondary | ICD-10-CM | POA: Insufficient documentation

## 2024-01-25 DIAGNOSIS — D582 Other hemoglobinopathies: Secondary | ICD-10-CM | POA: Insufficient documentation

## 2024-01-25 DIAGNOSIS — J449 Chronic obstructive pulmonary disease, unspecified: Secondary | ICD-10-CM | POA: Diagnosis not present

## 2024-01-25 MED ORDER — ALBUTEROL SULFATE HFA 108 (90 BASE) MCG/ACT IN AERS
2.0000 | INHALATION_SPRAY | Freq: Four times a day (QID) | RESPIRATORY_TRACT | 2 refills | Status: AC | PRN
Start: 2024-01-25 — End: ?

## 2024-01-25 MED ORDER — TRELEGY ELLIPTA 200-62.5-25 MCG/ACT IN AEPB: 1.0000 | INHALATION_SPRAY | Freq: Every day | RESPIRATORY_TRACT | 2 refills | Status: AC

## 2024-01-25 NOTE — Progress Notes (Signed)
 Established patient visit   Patient: Jon Yang   DOB: 1965-08-07   58 y.o. Male  MRN: [id number] Visit Date: 01/25/2024  Today's healthcare provider: LAURAINE LOISE BUOY, DO   Chief Complaint  Patient presents with   Medication Refill   Subjective    Medication Refill   Jon Yang is a 58 year old male who presents for medication refills for Trelegy and albuterol .  He has not been taking his medications consistently, including Trelegy, which he uses only when he feels it is necessary. He mentions having one use left of his current Trelegy inhaler, which typically lasts him about a month and a half due to infrequent use. He took Trelegy this morning but does not feel the need for it today.  He has a history of high blood pressure but does not take his blood pressure medications consistently. He does not monitor his blood pressure at home and is skeptical about the readings taken during the visit, believing they normalize before he leaves office visits. He has not filled his medications recently and is unsure when they were last filled.  He has not seen a pulmonologist in years and does not follow up regularly with his cardiologist, whom he last saw in 2021. 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  He experiences occasional shortness of breath, which he attributes to the heat. He uses albuterol , but there is some confusion about the specific inhaler due to insurance changes. He recognizes the inhalers by color, noting a preference for the 'red one' which is not currently covered by his insurance.  He has declined screenings for lung and colon cancer and also declines vaccines. He previously completed a Cologuard test in 2021 but does not wish to repeat it. He is hesitant about undergoing blood work due to a past allergic reaction in the lab  environment.  He works for the city in [place] and prefers not to visit doctors unless necessary. He expresses a general reluctance to engage in medical care, stating 'I don't really want to be in a doctor's office.'  No current chest pain but occasional shortness of breath and wheezing.      Medications: Outpatient Medications Prior to Visit  Medication Sig   [DISCONTINUED] albuterol  (VENTOLIN  HFA) 108 (90 Base) MCG/ACT inhaler Inhale 2 puffs into the lungs every 6 (six) hours as needed for wheezing or shortness of breath. NEED TO KEEP SCHEDULED VISIT WITH DR. Margie Brink ON 01-25-2024   [DISCONTINUED] TRELEGY ELLIPTA  200-62.5-25 MCG/ACT AEPB TAKE 1 PUFF BY MOUTH EVERY DAY   amLODipine  (NORVASC ) 5 MG tablet TAKE 1 TABLET BY MOUTH EVERY DAY IN THE EVENING (Patient not taking: Reported on 01/25/2024)   aspirin  EC 81 MG EC tablet Take 1 tablet (81 mg total) by mouth daily. Swallow whole. (Patient not taking: Reported on 01/25/2024)   atorvastatin  (LIPITOR) 40 MG tablet TAKE 1 TABLET BY MOUTH EVERY DAY (Patient not taking: Reported on 01/25/2024)   azelastine (OPTIVAR) 0.05 % ophthalmic solution Place 1 drop into the right eye. (Patient not taking: Reported on 01/05/2023)   Azelastine HCl 137 MCG/SPRAY SOLN Place into both nostrils. (Patient not taking: Reported on 01/25/2024)   B Complex Vitamins (VITAMIN B COMPLEX PO) Take 1 tablet by mouth daily as needed. (Patient not taking: Reported on 01/25/2024)   carvedilol  (COREG ) 12.5  MG tablet Take 1 tablet (12.5 mg total) by mouth 2 (two) times daily with a meal. (Patient not taking: Reported on 01/25/2024)   Cholecalciferol (D3-1000) 25 MCG (1000 UT) tablet Take 1 tablet (1,000 Units total) by mouth daily. (Patient not taking: Reported on 01/25/2024)   clopidogrel  (PLAVIX ) 75 MG tablet TAKE 1 TABLET BY MOUTH EVERY DAY (Patient not taking: Reported on 01/25/2024)   fluticasone  (FLONASE ) 50 MCG/ACT nasal spray Place 1 spray into both nostrils 2 (two) times daily.  (Patient not taking: Reported on 01/25/2024)   ipratropium-albuterol  (DUONEB) 0.5-2.5 (3) MG/3ML SOLN Inhale 3 mLs into the lungs 4 (four) times daily as needed. (Patient not taking: Reported on 01/25/2024)   loratadine (CLARITIN) 10 MG tablet Take 10 mg by mouth daily as needed for allergies. (Patient not taking: Reported on 01/25/2024)   Spacer/Aero-Holding Chambers (OPTICHAMBER DIAMOND-SM MASK) MISC Use with inhalers. (Patient not taking: Reported on 01/25/2024)   valsartan -hydrochlorothiazide  (DIOVAN -HCT) 160-12.5 MG tablet TAKE 1 TABLET BY MOUTH EVERY DAY (Patient not taking: Reported on 01/25/2024)   [DISCONTINUED] levalbuterol  (XOPENEX  HFA) 45 MCG/ACT inhaler Inhale 2 puffs into the lungs every 6 (six) hours as needed for wheezing. (Patient not taking: Reported on 01/25/2024)   No facility-administered medications prior to visit.        Objective    BP (!) 193/104 (BP Location: Right Arm, Patient Position: Sitting)   Pulse 80   Resp 20   Ht 5' 10 (1.778 m)   Wt 215 lb 1.6 oz (97.6 kg)   SpO2 97%   BMI 30.86 kg/m     Physical Exam Vitals reviewed.  Constitutional:      General: He is not in acute distress.    Appearance: Normal appearance. He is not diaphoretic.  HENT:     Head: Normocephalic and atraumatic.  Eyes:     General: No scleral icterus.    Conjunctiva/sclera: Conjunctivae normal.  Neck:     Vascular: No carotid bruit.  Cardiovascular:     Rate and Rhythm: Normal rate and regular rhythm.     Pulses: Normal pulses.     Heart sounds: Normal heart sounds. No murmur heard. Pulmonary:     Effort: Pulmonary effort is normal. No respiratory distress.     Breath sounds: Normal breath sounds. No wheezing or rhonchi.  Musculoskeletal:     Cervical back: Neck supple.     Right lower leg: No edema.     Left lower leg: No edema.  Lymphadenopathy:     Cervical: No cervical adenopathy.  Skin:    General: Skin is warm and dry.     Findings: No rash.  Neurological:      Mental Status: He is alert and oriented to person, place, and time. Mental status is at baseline.  Psychiatric:        Mood and Affect: Mood normal.        Behavior: Behavior normal.      No results found for any visits on 01/25/24.  Assessment & Plan    Chronic obstructive pulmonary disease, unspecified COPD type (HCC) -     Albuterol  Sulfate HFA; Inhale 2 puffs into the lungs every 6 (six) hours as needed for wheezing or shortness of breath.  Dispense: 8 g; Refill: 2 -     Trelegy Ellipta ; Inhale 1 puff into the lungs daily.  Dispense: 30 each; Refill: 2  Primary hypertension -     Comprehensive metabolic panel with GFR -     Lipid panel  Asymptomatic hypertensive urgency  Hyperlipidemia, unspecified hyperlipidemia type -     Hemoglobin A1c  Elevated hemoglobin (HCC) -     CBC with Differential/Platelet  Medically noncompliant  History of hemorrhagic stroke with residual hemiparesis (HCC)  Colon cancer screening declined  Lung cancer screening declined by patient     Chronic Obstructive Pulmonary Disease (COPD) Inconsistent use of Trelegy, occasional dyspnea and wheezing exacerbated by heat, no pulmonologist consultation in years. - [facility] [address] - Encouraged consistent use of Trelegy as prescribed.  Hypertension; hypertensive urgency Elevated blood pressure due to nonadherence antihypertensive medication use, skepticism about office readings, states he understands risks of uncontrolled hypertension. - Encouraged adherence to antihypertensive medications. - Discussed importance of regular blood pressure monitoring.  Hyperlipidemia Non-adherent to cholesterol medication, has not filled prescription since 2023, understands risks of untreated hyperlipidemia. - Encouraged adherence to cholesterol medication.  History of hemorrhagic stroke with residual hemiparesis Patient's blood pressure remains uncontrolled  and he is unwilling to restart his medications.  Discussed with him that this places him at significant risk for recurrent stroke as well as heart attack and other concerns.  Patient expressed understanding and endorsed he was still unwilling to take his oral medications.  General Health Maintenance; Medically Noncompliant Declines lung and colon cancer screenings, vaccinations; hesitant about blood work due to past allergic reaction.  Unwilling to take oral medications, despite endorsing understanding of risks.  He has not been taking his prescription medications since approximately 2023. - Blood work ordered.    Follow-up No follow-up with primary care physician since 2023 or cardiologist since 2021, uses city (work) doctor for urgent care. - Encouraged regular follow-up appointments with primary care and cardiology.  Return in about 3 months (around 04/26/2024) for CPE w/PCP.      I discussed the assessment and treatment plan with the patient  The patient was provided an opportunity to ask questions and all were answered. The patient agreed with the plan and demonstrated an understanding of the instructions.   The patient was advised to call back or seek an in-person evaluation if the symptoms worsen or if the condition fails to improve as anticipated.    LAURAINE LOISE BUOY, DO  [facility] [phone] (phone) [phone] (fax)  [facility]

## 2024-01-26 ENCOUNTER — Ambulatory Visit: Payer: Self-pay | Admitting: Family Medicine

## 2024-01-26 LAB — CBC WITH DIFFERENTIAL/PLATELET
Basophils Absolute: 0.1 10*3/uL (ref 0.0–0.2)
Basos: 1 %
EOS (ABSOLUTE): 0.3 10*3/uL (ref 0.0–0.4)
Eos: 3 %
Hematocrit: 53 % — ABNORMAL HIGH (ref 37.5–51.0)
Hemoglobin: 18.1 g/dL — ABNORMAL HIGH (ref 13.0–17.7)
Immature Grans (Abs): 0 10*3/uL (ref 0.0–0.1)
Immature Granulocytes: 0 %
Lymphocytes Absolute: 2.6 10*3/uL (ref 0.7–3.1)
Lymphs: 23 %
MCH: 30.9 pg (ref 26.6–33.0)
MCHC: 34.2 g/dL (ref 31.5–35.7)
MCV: 91 fL (ref 79–97)
Monocytes Absolute: 1 10*3/uL — ABNORMAL HIGH (ref 0.1–0.9)
Monocytes: 9 %
Neutrophils Absolute: 7.3 10*3/uL — ABNORMAL HIGH (ref 1.4–7.0)
Neutrophils: 64 %
Platelets: 244 10*3/uL (ref 150–450)
RBC: 5.85 x10E6/uL — ABNORMAL HIGH (ref 4.14–5.80)
RDW: 12.6 % (ref 11.6–15.4)
WBC: 11.3 10*3/uL — ABNORMAL HIGH (ref 3.4–10.8)

## 2024-01-26 LAB — COMPREHENSIVE METABOLIC PANEL WITH GFR
ALT: 22 IU/L (ref 0–44)
AST: 28 IU/L (ref 0–40)
Albumin: 4.2 g/dL (ref 3.8–4.9)
Alkaline Phosphatase: 120 IU/L (ref 44–121)
BUN/Creatinine Ratio: 7 — ABNORMAL LOW (ref 9–20)
BUN: 8 mg/dL (ref 6–24)
Bilirubin Total: 0.8 mg/dL (ref 0.0–1.2)
CO2: 22 mmol/L (ref 20–29)
Calcium: 9.3 mg/dL (ref 8.7–10.2)
Chloride: 103 mmol/L (ref 96–106)
Creatinine, Ser: 1.08 mg/dL (ref 0.76–1.27)
Globulin, Total: 2.7 g/dL (ref 1.5–4.5)
Glucose: 81 mg/dL (ref 70–99)
Potassium: 4.9 mmol/L (ref 3.5–5.2)
Sodium: 142 mmol/L (ref 134–144)
Total Protein: 6.9 g/dL (ref 6.0–8.5)
eGFR: 80 mL/min/{1.73_m2} (ref >59)

## 2024-01-26 LAB — LIPID PANEL
Chol/HDL Ratio: 6.4 ratio — ABNORMAL HIGH (ref 0.0–5.0)
Cholesterol, Total: 235 mg/dL — ABNORMAL HIGH (ref 100–199)
HDL: 37 mg/dL — ABNORMAL LOW (ref >39)
LDL Chol Calc (NIH): 163 mg/dL — ABNORMAL HIGH (ref 0–99)
Triglycerides: 191 mg/dL — ABNORMAL HIGH (ref 0–149)
VLDL Cholesterol Cal: 35 mg/dL (ref 5–40)

## 2024-01-26 LAB — HEMOGLOBIN A1C
Est. average glucose Bld gHb Est-mCnc: 111 mg/dL
Hgb A1c MFr Bld: 5.5 % (ref 4.8–5.6)

## 2024-01-31 ENCOUNTER — Ambulatory Visit: Payer: Self-pay | Admitting: Physician Assistant

## 2024-01-31 ENCOUNTER — Encounter: Payer: Self-pay | Admitting: Physician Assistant

## 2024-01-31 DIAGNOSIS — L03115 Cellulitis of right lower limb: Secondary | ICD-10-CM

## 2024-01-31 MED ORDER — METHYLPREDNISOLONE 4 MG PO TBPK: ORAL_TABLET | ORAL | 0 refills | Status: DC

## 2024-01-31 MED ORDER — SULFAMETHOXAZOLE-TRIMETHOPRIM 800-160 MG PO TABS: 1.0000 | ORAL_TABLET | Freq: Two times a day (BID) | ORAL | 0 refills | Status: AC

## 2024-01-31 MED ORDER — METHYLPREDNISOLONE SODIUM SUCC 40 MG IJ SOLR
40.0000 mg | Freq: Once | INTRAMUSCULAR | Status: AC
  Administered 2024-01-31: 40 mg via INTRAMUSCULAR

## 2024-01-31 NOTE — Progress Notes (Signed)
 Pt presents today with sore under  right pinky toe. Pt also has sores going up both legs around pelvic and around the glute area since July 3rd. Pt states he did pierce under his toe with a straight pen to get some relief.

## 2024-01-31 NOTE — Progress Notes (Signed)
   Subjective: Cellulitis bilateral foot    Patient ID: Jon Yang, male    DOB: November 16, 1965, 58 y.o.   MRN: [id number]  HPI Patient presents with erythema and itching to feet.  Onset of complaint proximately week ago.  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  Patient states itching is relieved by over-the-counter ointment.  Patient also has an eruption blister plantar aspect of the right foot.  Patient irruption the lesion with a needle 2 days ago.  Denies fever or chills with complaint.   Review of Systems COPD, hyperlipidemia, and hypertension.    Objective:   Physical Exam Examination of feet reveals a ruptured papular lesion plantar aspect of fifth digit right foot.  Patient also has erythema to the ankles.  Signs and symptoms secondary infection.  Signs of excoriation.       Assessment & Plan: Cellulitis secondary to insect bites   Patient advised Epsom salt soaks 5 to 10 minutes daily.  Patient given a prescription of Bactrim  DS and a Medrol  Dosepak.  Patient advised to follow-up in 1 week.

## 2024-03-30 ENCOUNTER — Other Ambulatory Visit: Payer: Self-pay

## 2024-03-30 NOTE — Progress Notes (Signed)
 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 UDS RESULTS PENDING WITH [employer]. LAB SPECIMEN ID: [id number]

## 2024-07-22 ENCOUNTER — Encounter: Payer: Self-pay | Admitting: Family Medicine

## 2024-08-22 DIAGNOSIS — H1045 Other chronic allergic conjunctivitis: Secondary | ICD-10-CM | POA: Insufficient documentation

## 2024-08-22 DIAGNOSIS — F17200 Nicotine dependence, unspecified, uncomplicated: Secondary | ICD-10-CM | POA: Insufficient documentation

## 2024-08-22 DIAGNOSIS — J309 Allergic rhinitis, unspecified: Secondary | ICD-10-CM | POA: Insufficient documentation

## 2024-08-22 DIAGNOSIS — R052 Subacute cough: Secondary | ICD-10-CM | POA: Insufficient documentation

## 2024-08-22 DIAGNOSIS — J3081 Allergic rhinitis due to animal (cat) (dog) hair and dander: Secondary | ICD-10-CM | POA: Insufficient documentation

## 2024-08-23 ENCOUNTER — Encounter: Payer: Self-pay | Admitting: Family Medicine

## 2024-08-24 ENCOUNTER — Other Ambulatory Visit: Payer: Self-pay

## 2024-08-24 DIAGNOSIS — J449 Chronic obstructive pulmonary disease, unspecified: Secondary | ICD-10-CM

## 2024-08-24 NOTE — Telephone Encounter (Signed)
 Pt is requesting refill, per his medicine list in July with Dr Donzella he report no longer taking and advise to return in 3 months for f/u. Per his dispense report pt last dispense medicine 02/2023. Please review.  I sent a message to pt to schedule an appt and stated we could do a courtesy refill. Please advise.

## 2024-08-25 MED ORDER — IPRATROPIUM-ALBUTEROL 0.5-2.5 (3) MG/3ML IN SOLN: 3.0000 mL | Freq: Four times a day (QID) | RESPIRATORY_TRACT | 0 refills | Status: AC | PRN
